# Patient Record
Sex: Female | Born: 1940 | Race: White | Hispanic: No | Marital: Married | Smoking: Never smoker
Health system: Southern US, Community
[De-identification: ages and names within clinical notes are randomized; demographics above are authoritative.]

## PROBLEM LIST (undated history)

## (undated) DIAGNOSIS — I340 Nonrheumatic mitral (valve) insufficiency: Secondary | ICD-10-CM

## (undated) DIAGNOSIS — N183 Chronic kidney disease, stage 3 unspecified: Secondary | ICD-10-CM

## (undated) DIAGNOSIS — I10 Essential (primary) hypertension: Secondary | ICD-10-CM

## (undated) DIAGNOSIS — I428 Other cardiomyopathies: Secondary | ICD-10-CM

## (undated) DIAGNOSIS — E119 Type 2 diabetes mellitus without complications: Secondary | ICD-10-CM

## (undated) DIAGNOSIS — R42 Dizziness and giddiness: Secondary | ICD-10-CM

## (undated) DIAGNOSIS — E7800 Pure hypercholesterolemia, unspecified: Secondary | ICD-10-CM

## (undated) HISTORY — DX: Chronic kidney disease, stage 3 unspecified: N18.30

## (undated) HISTORY — DX: Type 2 diabetes mellitus without complications: E11.9

## (undated) HISTORY — DX: Other cardiomyopathies: I42.8

## (undated) HISTORY — DX: Essential (primary) hypertension: I10

## (undated) HISTORY — DX: Dizziness and giddiness: R42

## (undated) HISTORY — DX: Nonrheumatic mitral (valve) insufficiency: I34.0

## (undated) HISTORY — DX: Pure hypercholesterolemia, unspecified: E78.00

## (undated) HISTORY — DX: Chronic kidney disease, stage 3 (moderate): N18.3

## (undated) HISTORY — PX: TONSILLECTOMY: SHX5217

## (undated) HISTORY — PX: TUBAL LIGATION: SHX77

---

## 2010-07-13 ENCOUNTER — Inpatient Hospital Stay (HOSPITAL_COMMUNITY)
Admission: EM | Admit: 2010-07-13 | Discharge: 2010-07-25 | Payer: Self-pay | Source: Home / Self Care | Admitting: Cardiology

## 2010-07-14 ENCOUNTER — Encounter (INDEPENDENT_AMBULATORY_CARE_PROVIDER_SITE_OTHER): Payer: Self-pay | Admitting: Cardiology

## 2010-07-18 HISTORY — PX: CARDIAC CATHETERIZATION: SHX172

## 2010-11-01 ENCOUNTER — Ambulatory Visit: Payer: Self-pay | Admitting: Oncology

## 2010-11-10 LAB — COMPREHENSIVE METABOLIC PANEL
ALT: 9 U/L (ref 0–35)
AST: 11 U/L (ref 0–37)
Albumin: 4.5 g/dL (ref 3.5–5.2)
Alkaline Phosphatase: 90 U/L (ref 39–117)
BUN: 29 mg/dL — ABNORMAL HIGH (ref 6–23)
CO2: 26 mEq/L (ref 19–32)
Calcium: 9.9 mg/dL (ref 8.4–10.5)
Chloride: 102 mEq/L (ref 96–112)
Creatinine, Ser: 1.26 mg/dL — ABNORMAL HIGH (ref 0.40–1.20)
Glucose, Bld: 134 mg/dL — ABNORMAL HIGH (ref 70–99)
Potassium: 4.5 mEq/L (ref 3.5–5.3)
Sodium: 140 mEq/L (ref 135–145)
Total Bilirubin: 0.7 mg/dL (ref 0.3–1.2)
Total Protein: 7.2 g/dL (ref 6.0–8.3)

## 2010-11-10 LAB — CBC WITH DIFFERENTIAL/PLATELET
BASO%: 0.2 % (ref 0.0–2.0)
Basophils Absolute: 0 10*3/uL (ref 0.0–0.1)
EOS%: 25.7 % — ABNORMAL HIGH (ref 0.0–7.0)
Eosinophils Absolute: 3 10*3/uL — ABNORMAL HIGH (ref 0.0–0.5)
HCT: 35.6 % (ref 34.8–46.6)
HGB: 11.7 g/dL (ref 11.6–15.9)
LYMPH%: 14.8 % (ref 14.0–49.7)
MCH: 29.3 pg (ref 25.1–34.0)
MCHC: 33 g/dL (ref 31.5–36.0)
MCV: 88.8 fL (ref 79.5–101.0)
MONO#: 0.5 10*3/uL (ref 0.1–0.9)
MONO%: 4.6 % (ref 0.0–14.0)
NEUT#: 6.4 10*3/uL (ref 1.5–6.5)
NEUT%: 54.7 % (ref 38.4–76.8)
Platelets: 355 10*3/uL (ref 145–400)
RBC: 4.01 10*6/uL (ref 3.70–5.45)
RDW: 15.4 % — ABNORMAL HIGH (ref 11.2–14.5)
WBC: 11.8 10*3/uL — ABNORMAL HIGH (ref 3.9–10.3)
lymph#: 1.7 10*3/uL (ref 0.9–3.3)

## 2010-11-10 LAB — CHCC SMEAR

## 2010-11-10 LAB — TECHNOLOGIST REVIEW: Technologist Review: NONE SEEN

## 2010-11-15 LAB — JAK-2 V617F

## 2010-11-15 LAB — BCR/ABL (LIO MMD)

## 2010-11-21 ENCOUNTER — Inpatient Hospital Stay (HOSPITAL_COMMUNITY)
Admission: EM | Admit: 2010-11-21 | Discharge: 2010-11-25 | Payer: Self-pay | Source: Home / Self Care | Attending: Internal Medicine | Admitting: Internal Medicine

## 2010-11-22 ENCOUNTER — Encounter (INDEPENDENT_AMBULATORY_CARE_PROVIDER_SITE_OTHER): Payer: Self-pay | Admitting: Internal Medicine

## 2010-11-23 LAB — URINE MICROSCOPIC-ADD ON

## 2010-11-23 LAB — COMPREHENSIVE METABOLIC PANEL
ALT: 15 U/L (ref 0–35)
AST: 18 U/L (ref 0–37)
Albumin: 2.8 g/dL — ABNORMAL LOW (ref 3.5–5.2)
Alkaline Phosphatase: 91 U/L (ref 39–117)
BUN: 62 mg/dL — ABNORMAL HIGH (ref 6–23)
CO2: 24 mEq/L (ref 19–32)
Calcium: 9.2 mg/dL (ref 8.4–10.5)
Chloride: 97 mEq/L (ref 96–112)
Creatinine, Ser: 2.02 mg/dL — ABNORMAL HIGH (ref 0.4–1.2)
GFR calc Af Amer: 30 mL/min — ABNORMAL LOW (ref >60)
GFR calc non Af Amer: 24 mL/min — ABNORMAL LOW (ref >60)
Glucose, Bld: 297 mg/dL — ABNORMAL HIGH (ref 70–99)
Potassium: 3.8 mEq/L (ref 3.5–5.1)
Sodium: 133 mEq/L — ABNORMAL LOW (ref 135–145)
Total Bilirubin: 0.8 mg/dL (ref 0.3–1.2)
Total Protein: 6.6 g/dL (ref 6.0–8.3)

## 2010-11-23 LAB — GLUCOSE, CAPILLARY
Glucose-Capillary: 158 mg/dL — ABNORMAL HIGH (ref 70–99)
Glucose-Capillary: 201 mg/dL — ABNORMAL HIGH (ref 70–99)
Glucose-Capillary: 175 mg/dL — ABNORMAL HIGH (ref 70–99)
Glucose-Capillary: 150 mg/dL — ABNORMAL HIGH (ref 70–99)

## 2010-11-23 LAB — URINALYSIS, ROUTINE W REFLEX MICROSCOPIC
Bilirubin Urine: NEGATIVE
Ketones, ur: NEGATIVE mg/dL
Nitrite: NEGATIVE
Protein, ur: 30 mg/dL — AB
Specific Gravity, Urine: 1.017 (ref 1.005–1.030)
Urine Glucose, Fasting: NEGATIVE mg/dL
Urobilinogen, UA: 1 mg/dL (ref 0.0–1.0)
pH: 5 (ref 5.0–8.0)

## 2010-11-23 LAB — BASIC METABOLIC PANEL
BUN: 62 mg/dL — ABNORMAL HIGH (ref 6–23)
CO2: 25 mEq/L (ref 19–32)
Calcium: 8.8 mg/dL (ref 8.4–10.5)
Chloride: 103 mEq/L (ref 96–112)
Creatinine, Ser: 1.77 mg/dL — ABNORMAL HIGH (ref 0.4–1.2)
GFR calc Af Amer: 35 mL/min — ABNORMAL LOW (ref >60)
GFR calc non Af Amer: 29 mL/min — ABNORMAL LOW (ref >60)
Glucose, Bld: 191 mg/dL — ABNORMAL HIGH (ref 70–99)
Potassium: 3.6 mEq/L (ref 3.5–5.1)
Sodium: 136 mEq/L (ref 135–145)

## 2010-11-23 LAB — PROTIME-INR
INR: 1.21 (ref 0.00–1.49)
Prothrombin Time: 15.5 seconds — ABNORMAL HIGH (ref 11.6–15.2)

## 2010-11-23 LAB — CBC
HCT: 30.7 % — ABNORMAL LOW (ref 36.0–46.0)
HCT: 28.8 % — ABNORMAL LOW (ref 36.0–46.0)
Hemoglobin: 10.3 g/dL — ABNORMAL LOW (ref 12.0–15.0)
Hemoglobin: 9.6 g/dL — ABNORMAL LOW (ref 12.0–15.0)
MCH: 29.6 pg (ref 26.0–34.0)
MCH: 29.3 pg (ref 26.0–34.0)
MCHC: 33.6 g/dL (ref 30.0–36.0)
MCHC: 33.3 g/dL (ref 30.0–36.0)
MCV: 88.2 fL (ref 78.0–100.0)
MCV: 87.8 fL (ref 78.0–100.0)
Platelets: 234 10*3/uL (ref 150–400)
Platelets: 236 10*3/uL (ref 150–400)
RBC: 3.48 MIL/uL — ABNORMAL LOW (ref 3.87–5.11)
RBC: 3.28 MIL/uL — ABNORMAL LOW (ref 3.87–5.11)
RDW: 14.4 % (ref 11.5–15.5)
RDW: 14.4 % (ref 11.5–15.5)
WBC: 16 10*3/uL — ABNORMAL HIGH (ref 4.0–10.5)
WBC: 15.9 10*3/uL — ABNORMAL HIGH (ref 4.0–10.5)

## 2010-11-23 LAB — DIFFERENTIAL
Basophils Absolute: 0 10*3/uL (ref 0.0–0.1)
Basophils Relative: 0 % (ref 0–1)
Eosinophils Absolute: 0 10*3/uL (ref 0.0–0.7)
Eosinophils Relative: 0 % (ref 0–5)
Lymphocytes Relative: 5 % — ABNORMAL LOW (ref 12–46)
Lymphs Abs: 0.9 10*3/uL (ref 0.7–4.0)
Monocytes Absolute: 1.4 10*3/uL — ABNORMAL HIGH (ref 0.1–1.0)
Monocytes Relative: 9 % (ref 3–12)
Neutro Abs: 13.8 10*3/uL — ABNORMAL HIGH (ref 1.7–7.7)
Neutrophils Relative %: 86 % — ABNORMAL HIGH (ref 43–77)

## 2010-11-23 LAB — HEMOGLOBIN A1C
Hgb A1c MFr Bld: 6.2 % — ABNORMAL HIGH (ref <5.7)
Mean Plasma Glucose: 131 mg/dL — ABNORMAL HIGH (ref <117)

## 2010-11-23 LAB — LIPID PANEL
Cholesterol: 127 mg/dL (ref 0–200)
HDL: 17 mg/dL — ABNORMAL LOW (ref >39)
LDL Cholesterol: 62 mg/dL (ref 0–99)
Total CHOL/HDL Ratio: 7.5 RATIO
Triglycerides: 240 mg/dL — ABNORMAL HIGH (ref <150)
VLDL: 48 mg/dL — ABNORMAL HIGH (ref 0–40)

## 2010-11-23 LAB — TSH: TSH: 1.747 u[IU]/mL (ref 0.350–4.500)

## 2010-11-23 LAB — CARDIAC PANEL(CRET KIN+CKTOT+MB+TROPI)
CK, MB: 1.5 ng/mL (ref 0.3–4.0)
CK, MB: 1.1 ng/mL (ref 0.3–4.0)
Relative Index: INVALID (ref 0.0–2.5)
Relative Index: INVALID (ref 0.0–2.5)
Total CK: 43 U/L (ref 7–177)
Total CK: 37 U/L (ref 7–177)
Troponin I: 0.05 ng/mL (ref 0.00–0.06)
Troponin I: 0.03 ng/mL (ref 0.00–0.06)

## 2010-11-23 LAB — LIPASE, BLOOD: Lipase: 23 U/L (ref 11–59)

## 2010-11-23 LAB — TROPONIN I: Troponin I: 0.01 ng/mL (ref 0.00–0.06)

## 2010-11-23 LAB — PHOSPHORUS: Phosphorus: 3.4 mg/dL (ref 2.3–4.6)

## 2010-11-23 LAB — PROCALCITONIN: Procalcitonin: 8.46 ng/mL

## 2010-11-23 LAB — CK TOTAL AND CKMB (NOT AT ARMC)
CK, MB: 1.2 ng/mL (ref 0.3–4.0)
Relative Index: INVALID (ref 0.0–2.5)
Total CK: 44 U/L (ref 7–177)

## 2010-11-23 LAB — MAGNESIUM: Magnesium: 2.5 mg/dL (ref 1.5–2.5)

## 2010-11-23 LAB — LACTIC ACID, PLASMA: Lactic Acid, Venous: 1.1 mmol/L (ref 0.5–2.2)

## 2010-11-23 LAB — OCCULT BLOOD, POC DEVICE: Fecal Occult Bld: NEGATIVE

## 2010-11-28 LAB — GLUCOSE, CAPILLARY
Glucose-Capillary: 135 mg/dL — ABNORMAL HIGH (ref 70–99)
Glucose-Capillary: 186 mg/dL — ABNORMAL HIGH (ref 70–99)
Glucose-Capillary: 119 mg/dL — ABNORMAL HIGH (ref 70–99)

## 2010-11-28 LAB — BASIC METABOLIC PANEL
CO2: 23 mEq/L (ref 19–32)
Calcium: 8.6 mg/dL (ref 8.4–10.5)
Chloride: 107 mEq/L (ref 96–112)
Chloride: 107 mEq/L (ref 96–112)
Creatinine, Ser: 1.12 mg/dL (ref 0.4–1.2)
Creatinine, Ser: 1.1 mg/dL (ref 0.4–1.2)
GFR calc Af Amer: 48 mL/min — ABNORMAL LOW (ref >60)
GFR calc Af Amer: 59 mL/min — ABNORMAL LOW (ref >60)
GFR calc Af Amer: 60 mL/min — ABNORMAL LOW (ref >60)
GFR calc non Af Amer: 40 mL/min — ABNORMAL LOW (ref >60)
GFR calc non Af Amer: 48 mL/min — ABNORMAL LOW (ref >60)
Potassium: 3.4 mEq/L — ABNORMAL LOW (ref 3.5–5.1)
Potassium: 3.5 mEq/L (ref 3.5–5.1)
Sodium: 140 mEq/L (ref 135–145)
Sodium: 142 mEq/L (ref 135–145)

## 2010-11-28 LAB — CBC
HCT: 26.7 % — ABNORMAL LOW (ref 36.0–46.0)
HCT: 25.4 % — ABNORMAL LOW (ref 36.0–46.0)
Hemoglobin: 8.3 g/dL — ABNORMAL LOW (ref 12.0–15.0)
MCHC: 33.3 g/dL (ref 30.0–36.0)
MCV: 88.2 fL (ref 78.0–100.0)
MCV: 89.4 fL (ref 78.0–100.0)
Platelets: 235 10*3/uL (ref 150–400)
Platelets: 259 10*3/uL (ref 150–400)
RBC: 3.13 MIL/uL — ABNORMAL LOW (ref 3.87–5.11)
RBC: 2.84 MIL/uL — ABNORMAL LOW (ref 3.87–5.11)
RDW: 14.6 % (ref 11.5–15.5)
RDW: 14.5 % (ref 11.5–15.5)
WBC: 12.9 10*3/uL — ABNORMAL HIGH (ref 4.0–10.5)
WBC: 15.2 10*3/uL — ABNORMAL HIGH (ref 4.0–10.5)
WBC: 12.3 10*3/uL — ABNORMAL HIGH (ref 4.0–10.5)

## 2010-11-28 LAB — IRON AND TIBC: TIBC: 170 ug/dL — ABNORMAL LOW (ref 250–470)

## 2010-11-28 LAB — DIFFERENTIAL
Basophils Relative: 1 % (ref 0–1)
Eosinophils Relative: 5 % (ref 0–5)
Lymphocytes Relative: 19 % (ref 12–46)
Neutrophils Relative %: 65 % (ref 43–77)

## 2010-11-28 LAB — FERRITIN: Ferritin: 605 ng/mL — ABNORMAL HIGH (ref 10–291)

## 2010-11-28 LAB — BRAIN NATRIURETIC PEPTIDE: Pro B Natriuretic peptide (BNP): 414 pg/mL — ABNORMAL HIGH (ref 0.0–100.0)

## 2010-11-28 LAB — FOLATE: Folate: 16.1 ng/mL

## 2010-11-29 LAB — CULTURE, BLOOD (ROUTINE X 2): Culture  Setup Time: 201201180552

## 2010-11-30 LAB — CULTURE, BLOOD (ROUTINE X 2)

## 2010-12-02 NOTE — Discharge Summary (Addendum)
NAME:  Kimberly Castaneda, Kimberly Castaneda NO.:  [id number]  MEDICAL RECORD NO.:  [id number]          PATIENT TYPE:  INP  LOCATION:  5010                         FACILITY:  [facility]  PHYSICIAN:  Ramiro Harvest, MD    DATE OF BIRTH:  February 08, 1941  DATE OF ADMISSION:  11/21/2010 DATE OF DISCHARGE:  11/25/2010                        DISCHARGE SUMMARY - REFERRING   THE PATIENT'S PRIMARY CARE PHYSICIAN:  Dr. Laurann Montana of [facility].  DISCHARGE DIAGNOSES: 1. Weakness, multifactorial secondary to dehydration in the setting of     urinary tract infection, improved. 2. Urinary tract infection. 3. Orthostatic hypotension, resolved. 4. Acute renal failure, resolved. 5. Leukocytosis, being followed up as an outpatient with Dr. Clelia Croft. 6. Type 2 diabetes. 7. Nonischemic cardiomyopathy with a recent ejection fraction per 2-     dimensional echocardiogram of November 22, 2010, with an ejection     fraction of 45% to 50%. 8. History of systolic heart failure, compensated. 9. Hypertension. 10.History of allergies. 11.Anemia of chronic disease.  DISCHARGE MEDICATIONS: 1. Tessalon Perles 100 mg p.o. t.i.d. x3 days. 2. Ceftin 500 mg p.o. b.i.d. x4 days. 3. Zofran 4 mg p.o. every 6 hours p.r.n. 4. Aspirin 81 mg p.o. daily. 5. Coreg 12.5 mg p.o. b.i.d. 6. Mucinex 600 mg p.o. every 12 hours p.r.n. 7. Juice Plus garden blend supplement p.o. b.i.d. 8. Juice Plus orchid blend supplement p.o. b.i.d. 9. Lantus 12 units subcu q.h.s. 10.Metformin XR 500 mg 4 tablets p.o. q.h.s. 11.Omega-3 acid ethyl esters 1 tablet p.o. daily. 12.Ramipril 10 mg p.o. daily to start in 3 days. 13.Vitamin D 1000 units p.o. daily. 14.Lasix 40 mg half a tablet p.o. daily to start in 3 days.  DISPOSITION AND FOLLOW-UP:  The patient will be discharged home.  The patient is to follow up with a PCP early within 1 week.  On follow-up, the patient will need to be reassessed.  The patient's diuretics and spironolactone  were discontinued on discharge.  Her Lasix and her ramipril, the patient has been instructed to resume these in 3 days as the patient had presented with orthostatic hypotension and generalized weakness that improved off her diuretics.  The patient's renal function has improved as well.  The patient will need a BMET done on follow-up to follow up on electrolytes and renal function.  She will need a repeat urinalysis done for resolution of urinary tract infection.  The patient will need to follow up with her oncologist, Dr. Clelia Croft, as scheduled for further workup of her leukocytosis.  The patient will need a CBC done to follow up on her leukocytosis and hemoglobin and hematocrit.  CONSULTATIONS DONE:  None.  PROCEDURES PERFORMED: 1. A chest x-ray was done November 21, 2010, that showed minimal     peribronchial thickening, interval clearing of previously noted     left-sided pleural effusion.  No infiltrate, congestive heart     failure, or pneumothorax.  Mildly tortuous aorta. 2. A 2-D echo was obtained on November 22, 2010, that showed mild     concentric hypertrophy.  Systolic function was mildly reduced.  EF     of 45% to 50%.  Wall  motion was normal.  There were no regional     wall motion abnormalities.  Doppler parameters consistent with     abnormal left ventricular relaxation, grade 1 diastolic     dysfunction, normal pulmonary artery pressure.  BRIEF ADMISSION HISTORY AND PHYSICAL:  [name] is a 70 year old female known history of type 2 diabetes, hypertension, recently diagnosed ischemic cardiomyopathy with an EF of 15% who was discharged home in September of 2011.  The patient apparently in December had a leukocytosis with a white count of 20,000.  Repeat WBC showed that she had a white count up to 30,000.  She is being followed by oncology at that point.  White count was down to 11,000.  Since the last Friday prior to admission, the patient had been feeling weak  and sick.  She had had some bouts of diarrhea and also had nausea and vomiting.  She had not been eating or drinking adequately.  Her husband also noted weakness which had been persistent and progressive.  She decided, therefore, to present to the ED on the day of admission where she was found to be profoundly dehydrated as well as acute renal failure.  The patient had been only on Aldactone as well as Lasix for her diuretics.  Her last creatinine was 0.8 in September.  BNP then was only 218.  The patient denied any chest pain.  No shortness of breath.  No dizziness.  Had not been passing urine adequately.  She had an episode of chills the day prior to admission but no fever and was just globally week.  For the rest of the admission history and physical, please see H and P dictated per Dr. Mikeal Hawthorne, job number [id number].  HOSPITAL COURSE: 1. Generalized weakness.  The patient was admitted with generalized     weakness.  It was felt the patient's generalized weakness was     multifactorial in nature secondary to volume depletion secondary to     poor oral intake and some GI losses as well as underlying urinary     tract infection.  The patient was placed on IV antibiotics.  PT, OT     were consulted.  She was hydrated gently with IV fluids secondary     to a history of a depressed ejection fraction.  Cardiac enzymes     were cycled which were negative x3.  A TSH which was obtained was     within normal limits at 1.747.  A fecal occult blood test which was     obtained was also negative.  A pro calcitonin level was obtained     which was within normal limits.  Lipase levels were also within     normal limits.  The patient was followed, improved clinically     during the hospitalization, hydrated with IV fluids adequately.     She was seen by PT, OT.  The patient started working with physical     therapy, started ambulating with PT, OT, improved clinically.  Her     Lasix and her spironolactone  were held secondary to severe volume     depletion/dehydration secondary to acute renal failure.  The     patient was monitored and improved clinically on a daily basis such     that by the day of discharge she was ambulating with physical     therapy.  The patient will be discharged home in stable and     improved condition to follow  up with PCP as an outpatient. 2. Urinary tract infection.  On admission, urinalysis which was     obtained was consistent with a urinary tract infection.  However,     urine cultures, unfortunately, were not sent.  The patient was     placed on IV antibiotics during this time.  She had been on IV     Rocephin, and she will be subsequently converted to oral Ceftin for     4 more days to complete a 1-week course of antibiotic therapy.  The     patient will follow up with PCP as an outpatient where she will     likely need a repeat urinalysis done for resolution of urinary     tract infection. 3. Orthostatic hypotension.  On admission, the patient was noted to be     orthostatic.  Her diuretics and her blood pressure medications were     held.  Lasix and spironolactone as well as Coreg and ramipril were     held.  The patient was hydrated with IV fluids.  The patient was     euvolemic by the day of discharge.  She improved clinically     significantly, was asymptomatic, and will be discharged home in     stable and improved condition.  The patient has been instructed not     to resume Aldactone until she is seen by her PCP.  Her Lasix will     be resumed in 3 days as well as her ramipril.  The patient will be     discharged in stable and improved condition. 4. Acute renal failure.  On admission, the patient was noted to be in     acute renal failure with a creatinine of 2.02.  It was felt this     was prerenal azotemia secondary to volume depletion in the setting     of diuretic therapy and ACE inhibitor.  Her diuretics, Aldactone,     Lasix as well as  ramipril, were held.  The patient was hydrated     gently with IV fluids.  Her renal function improved on a daily     basis such that by the day of discharge her creatinine was down to     1.10.  The patient was having good urinary output during this time,     and she will be discharged home in stable and improved condition.     The patient has been instructed to resume her ramipril in 3 days as     well as her home-dose Lasix in 3 days.  Her Aldactone has been     discontinued at this time, and she will need to follow up with her     PCP to decide as to whether the patient is going to resume her     Aldactone in light of the fact that a repeat 2-D echo which was     obtained did show an EF of 455 to 50%.  The patient will be     discharged in stable and improved condition. 5. A history of nonischemic cardiomyopathy/history of CHF.  On     admission, the patient was admitted with profound dehydration.  It     was felt likely secondary to decreased oral intake, GI losses in     the setting of UTI as well as in the setting of diuretics.  The     patient's diuretics were held during the hospitalization as well  as     her ramipril.  Cardiac enzymes which were cycled were negative x3.     Two-D echo which was done with results as stated above did show an     EF of 455 to 50% with no wall motion abnormalities which was an     improvement from her last 2-D echo which was obtained in September     of 2011.  The patient remained compensated during this period.  Her     Coreg was resumed during the hospitalization as the patient     improved clinically.  She will be discharged home to resume her     Lasix in 3 days as well as her ramipril in 3 days post discharge.     Aldactone has been discontinued.  She will follow up with her PCP,     and on follow-up a BMET will need to be obtained to follow up on     electrolytes and renal function, and it will be deferred as to her     PCP as to whether to  resume the patient back her Aldactone.  The     patient will be discharged in stable and improved condition.  The     rest of the patient's chronic medical issues remained stable     throughout the hospitalization.  On day of discharge:  Vital signs:  Temperature 99.6, pulse of 67, respirations 16, blood pressure 138/62, saturating 96% on room air.  DISCHARGE LABS:  CBC:  White count 12.3, hemoglobin 8.3, hematocrit 25.4, platelet count of 298, with an ANC of 8.1.  BMET with a sodium of 142, potassium of 3.5, chloride of 107, bicarb 27, glucose 132, BUN 15, creatinine 1.10, and a calcium of 8.4.  It has been a pleasure taking care of Ms. [employer].     Ramiro Harvest, MD     DT/MEDQ  D:  11/25/2010  T:  11/25/2010  Job:  [id number]  cc:   Stacie Acres. Cliffton Asters, M.D. Blenda Nicely. [employer]  Electronically Signed by Ramiro Harvest MD on 12/02/2010 12:25:41 PM

## 2011-01-17 ENCOUNTER — Other Ambulatory Visit: Payer: Self-pay | Admitting: Oncology

## 2011-01-17 ENCOUNTER — Encounter (HOSPITAL_BASED_OUTPATIENT_CLINIC_OR_DEPARTMENT_OTHER): Payer: Medicare Other | Admitting: Oncology

## 2011-01-17 DIAGNOSIS — D72829 Elevated white blood cell count, unspecified: Secondary | ICD-10-CM

## 2011-01-17 LAB — CBC WITH DIFFERENTIAL/PLATELET
Basophils Absolute: 0.1 10*3/uL (ref 0.0–0.1)
EOS%: 4.7 % (ref 0.0–7.0)
Eosinophils Absolute: 0.4 10*3/uL (ref 0.0–0.5)
HCT: 32.4 % — ABNORMAL LOW (ref 34.8–46.6)
HGB: 10.8 g/dL — ABNORMAL LOW (ref 11.6–15.9)
LYMPH%: 21.5 % (ref 14.0–49.7)
MCH: 29.7 pg (ref 25.1–34.0)
MCHC: 33.2 g/dL (ref 31.5–36.0)
MONO#: 0.5 10*3/uL (ref 0.1–0.9)
NEUT#: 5.7 10*3/uL (ref 1.5–6.5)
NEUT%: 67.2 % (ref 38.4–76.8)
Platelets: 279 10*3/uL (ref 145–400)
lymph#: 1.8 10*3/uL (ref 0.9–3.3)

## 2011-01-19 LAB — CBC
HCT: 40.8 % (ref 36.0–46.0)
Hemoglobin: 13.4 g/dL (ref 12.0–15.0)
Hemoglobin: 13.4 g/dL (ref 12.0–15.0)
Hemoglobin: 14.4 g/dL (ref 12.0–15.0)
MCH: 28.8 pg (ref 26.0–34.0)
MCHC: 34.3 g/dL (ref 30.0–36.0)
RBC: 4.65 MIL/uL (ref 3.87–5.11)
RBC: 5.01 MIL/uL (ref 3.87–5.11)
RDW: 13.1 % (ref 11.5–15.5)
WBC: 10.5 10*3/uL (ref 4.0–10.5)
WBC: 11.2 10*3/uL — ABNORMAL HIGH (ref 4.0–10.5)

## 2011-01-19 LAB — POCT I-STAT 3, VENOUS BLOOD GAS (G3P V)
Acid-Base Excess: 3 mmol/L — ABNORMAL HIGH (ref 0.0–2.0)
Bicarbonate: 26.7 mEq/L — ABNORMAL HIGH (ref 20.0–24.0)
Bicarbonate: 30.3 mEq/L — ABNORMAL HIGH (ref 20.0–24.0)
O2 Saturation: 59 %
TCO2: 28 mmol/L (ref 0–100)
TCO2: 29 mmol/L (ref 0–100)
TCO2: 32 mmol/L (ref 0–100)
pCO2, Ven: 45 mmHg (ref 45.0–50.0)
pCO2, Ven: 51.2 mmHg — ABNORMAL HIGH (ref 45.0–50.0)
pH, Ven: 7.325 — ABNORMAL HIGH (ref 7.250–7.300)
pH, Ven: 7.4 — ABNORMAL HIGH (ref 7.250–7.300)
pO2, Ven: 29 mmHg — CL (ref 30.0–45.0)

## 2011-01-19 LAB — COMPREHENSIVE METABOLIC PANEL
AST: 45 U/L — ABNORMAL HIGH (ref 0–37)
Albumin: 4.2 g/dL (ref 3.5–5.2)
Alkaline Phosphatase: 134 U/L — ABNORMAL HIGH (ref 39–117)
BUN: 16 mg/dL (ref 6–23)
Creatinine, Ser: 0.77 mg/dL (ref 0.4–1.2)
GFR calc Af Amer: >60 mL/min (ref >60)
Potassium: 5.5 mEq/L — ABNORMAL HIGH (ref 3.5–5.1)
Total Protein: 7.3 g/dL (ref 6.0–8.3)

## 2011-01-19 LAB — POCT I-STAT 3, ART BLOOD GAS (G3+)
Acid-Base Excess: 3 mmol/L — ABNORMAL HIGH (ref 0.0–2.0)
O2 Saturation: 87 %
pCO2 arterial: 44.4 mmHg (ref 35.0–45.0)

## 2011-01-19 LAB — BRAIN NATRIURETIC PEPTIDE
Pro B Natriuretic peptide (BNP): 296 pg/mL — ABNORMAL HIGH (ref 0.0–100.0)
Pro B Natriuretic peptide (BNP): 332 pg/mL — ABNORMAL HIGH (ref 0.0–100.0)

## 2011-01-19 LAB — BASIC METABOLIC PANEL
BUN: 24 mg/dL — ABNORMAL HIGH (ref 6–23)
BUN: 13 mg/dL (ref 6–23)
BUN: 19 mg/dL (ref 6–23)
BUN: 19 mg/dL (ref 6–23)
BUN: 19 mg/dL (ref 6–23)
CO2: 27 mEq/L (ref 19–32)
CO2: 27 mEq/L (ref 19–32)
CO2: 27 mEq/L (ref 19–32)
Calcium: 10.2 mg/dL (ref 8.4–10.5)
Calcium: 10 mg/dL (ref 8.4–10.5)
Calcium: 9.2 mg/dL (ref 8.4–10.5)
Calcium: 9.5 mg/dL (ref 8.4–10.5)
Calcium: 9.8 mg/dL (ref 8.4–10.5)
Chloride: 104 mEq/L (ref 96–112)
Chloride: 97 mEq/L (ref 96–112)
Chloride: 98 mEq/L (ref 96–112)
Creatinine, Ser: 0.88 mg/dL (ref 0.4–1.2)
Creatinine, Ser: 0.75 mg/dL (ref 0.4–1.2)
Creatinine, Ser: 0.78 mg/dL (ref 0.4–1.2)
GFR calc Af Amer: >60 mL/min (ref >60)
GFR calc Af Amer: >60 mL/min (ref >60)
GFR calc non Af Amer: >60 mL/min (ref >60)
GFR calc non Af Amer: >60 mL/min (ref >60)
GFR calc non Af Amer: >60 mL/min (ref >60)
GFR calc non Af Amer: >60 mL/min (ref >60)
Glucose, Bld: 131 mg/dL — ABNORMAL HIGH (ref 70–99)
Glucose, Bld: 245 mg/dL — ABNORMAL HIGH (ref 70–99)
Glucose, Bld: 322 mg/dL — ABNORMAL HIGH (ref 70–99)
Potassium: 3.5 mEq/L (ref 3.5–5.1)
Potassium: 4.4 mEq/L (ref 3.5–5.1)
Potassium: 4.5 mEq/L (ref 3.5–5.1)
Sodium: 135 mEq/L (ref 135–145)
Sodium: 135 mEq/L (ref 135–145)
Sodium: 139 mEq/L (ref 135–145)

## 2011-01-19 LAB — DIFFERENTIAL
Eosinophils Relative: 1 % (ref 0–5)
Lymphocytes Relative: 19 % (ref 12–46)
Monocytes Absolute: 0.7 10*3/uL (ref 0.1–1.0)
Monocytes Relative: 6 % (ref 3–12)
Neutro Abs: 8.3 10*3/uL — ABNORMAL HIGH (ref 1.7–7.7)

## 2011-01-19 LAB — HEMOGLOBIN A1C
Hgb A1c MFr Bld: 12.3 % — ABNORMAL HIGH (ref <5.7)
Mean Plasma Glucose: 306 mg/dL — ABNORMAL HIGH (ref <117)

## 2011-01-19 LAB — PROTIME-INR
INR: 0.95 (ref 0.00–1.49)
INR: 0.99 (ref 0.00–1.49)
Prothrombin Time: 12.9 seconds (ref 11.6–15.2)

## 2011-01-19 LAB — FERRITIN: Ferritin: 228 ng/mL (ref 10–291)

## 2011-01-19 LAB — GLUCOSE, CAPILLARY
Glucose-Capillary: 181 mg/dL — ABNORMAL HIGH (ref 70–99)
Glucose-Capillary: 184 mg/dL — ABNORMAL HIGH (ref 70–99)
Glucose-Capillary: 217 mg/dL — ABNORMAL HIGH (ref 70–99)
Glucose-Capillary: 220 mg/dL — ABNORMAL HIGH (ref 70–99)
Glucose-Capillary: 221 mg/dL — ABNORMAL HIGH (ref 70–99)
Glucose-Capillary: 239 mg/dL — ABNORMAL HIGH (ref 70–99)
Glucose-Capillary: 257 mg/dL — ABNORMAL HIGH (ref 70–99)
Glucose-Capillary: 284 mg/dL — ABNORMAL HIGH (ref 70–99)
Glucose-Capillary: 175 mg/dL — ABNORMAL HIGH (ref 70–99)
Glucose-Capillary: 178 mg/dL — ABNORMAL HIGH (ref 70–99)
Glucose-Capillary: 188 mg/dL — ABNORMAL HIGH (ref 70–99)
Glucose-Capillary: 192 mg/dL — ABNORMAL HIGH (ref 70–99)
Glucose-Capillary: 215 mg/dL — ABNORMAL HIGH (ref 70–99)
Glucose-Capillary: 223 mg/dL — ABNORMAL HIGH (ref 70–99)
Glucose-Capillary: 224 mg/dL — ABNORMAL HIGH (ref 70–99)
Glucose-Capillary: 277 mg/dL — ABNORMAL HIGH (ref 70–99)
Glucose-Capillary: 307 mg/dL — ABNORMAL HIGH (ref 70–99)
Glucose-Capillary: 320 mg/dL — ABNORMAL HIGH (ref 70–99)

## 2011-01-19 LAB — APTT: aPTT: 34 seconds (ref 24–37)

## 2011-01-19 LAB — CARDIAC PANEL(CRET KIN+CKTOT+MB+TROPI)
CK, MB: 2.8 ng/mL (ref 0.3–4.0)
Relative Index: 2.5 (ref 0.0–2.5)
Relative Index: INVALID (ref 0.0–2.5)
Total CK: 70 U/L (ref 7–177)
Troponin I: 0.06 ng/mL (ref 0.00–0.06)

## 2011-01-19 LAB — IRON: Iron: 55 ug/dL (ref 42–135)

## 2011-01-19 LAB — LIPID PANEL
Cholesterol: 169 mg/dL (ref 0–200)
HDL: 55 mg/dL (ref >39)
LDL Cholesterol: 95 mg/dL (ref 0–99)
Total CHOL/HDL Ratio: 3.1 RATIO

## 2011-01-19 LAB — MAGNESIUM: Magnesium: 2.2 mg/dL (ref 1.5–2.5)

## 2012-04-03 HISTORY — PX: US ECHOCARDIOGRAPHY: HXRAD669

## 2013-04-04 ENCOUNTER — Other Ambulatory Visit: Payer: Self-pay | Admitting: Nephrology

## 2013-04-07 ENCOUNTER — Ambulatory Visit
Admission: RE | Admit: 2013-04-07 | Discharge: 2013-04-07 | Disposition: A | Discharge status: Home / Self Care | Payer: Medicare Other | Source: Ambulatory Visit | Attending: Nephrology | Admitting: Nephrology

## 2013-04-15 ENCOUNTER — Encounter: Payer: Self-pay | Admitting: *Deleted

## 2013-04-16 ENCOUNTER — Ambulatory Visit (INDEPENDENT_AMBULATORY_CARE_PROVIDER_SITE_OTHER): Payer: Medicare Other | Admitting: Cardiovascular Disease

## 2013-04-16 ENCOUNTER — Encounter: Payer: Self-pay | Admitting: Cardiovascular Disease

## 2013-04-16 VITALS — BP 134/82 | HR 77 | Resp 16 | Ht 62.0 in | Wt 147.4 lb

## 2013-04-16 DIAGNOSIS — Z79899 Other long term (current) drug therapy: Secondary | ICD-10-CM

## 2013-04-16 DIAGNOSIS — E119 Type 2 diabetes mellitus without complications: Secondary | ICD-10-CM

## 2013-04-16 DIAGNOSIS — E7800 Pure hypercholesterolemia, unspecified: Secondary | ICD-10-CM

## 2013-04-16 DIAGNOSIS — E785 Hyperlipidemia, unspecified: Secondary | ICD-10-CM | POA: Insufficient documentation

## 2013-04-16 DIAGNOSIS — I10 Essential (primary) hypertension: Secondary | ICD-10-CM

## 2013-04-16 DIAGNOSIS — I255 Ischemic cardiomyopathy: Secondary | ICD-10-CM

## 2013-04-16 DIAGNOSIS — I5032 Chronic diastolic (congestive) heart failure: Secondary | ICD-10-CM | POA: Insufficient documentation

## 2013-04-16 DIAGNOSIS — E782 Mixed hyperlipidemia: Secondary | ICD-10-CM

## 2013-04-16 DIAGNOSIS — I428 Other cardiomyopathies: Secondary | ICD-10-CM | POA: Insufficient documentation

## 2013-04-16 DIAGNOSIS — I2589 Other forms of chronic ischemic heart disease: Secondary | ICD-10-CM

## 2013-04-16 NOTE — Assessment & Plan Note (Signed)
Will retrieve the lab reports from her nephrologist. Judging by the fact that no adjustments were made to her metformin I suspect that she still has reasonably stable renal function. Tight blood pressure control is also important for the preservation of renal function.

## 2013-04-16 NOTE — Patient Instructions (Addendum)
Your physician recommends that you return for lab work done fasting.  Take the lab request forms to [facility] one morning fasting (no food or drink)-water with meds only.  Return to see Dr. Royann Shivers in one year.

## 2013-04-16 NOTE — Assessment & Plan Note (Signed)
Well controlled 

## 2013-04-16 NOTE — Assessment & Plan Note (Signed)
Most recent ejection fraction 40-45%.

## 2013-04-16 NOTE — Assessment & Plan Note (Addendum)
Upon presentation with congestive heart failure her ejection fraction was as low as 25%, but this improved dramatically with heart failure/antihypertensive medication. This suggests that the bulk of her cardiomyopathy may simply have been related to untreated systemic hypertension. Her blood pressure is now well controlled and she is on excellent doses of beta blockers and inhibitors.  She has never cessation functional class I and appears to be euvolemic. She is doing good job with sodium restriction. I reminded her about the importance of daily weight monitoring and the signs and symptoms of congestive heart failure.

## 2013-04-16 NOTE — Assessment & Plan Note (Signed)
She reports good glycemic control 

## 2013-04-16 NOTE — Progress Notes (Signed)
Patient ID: Kimberly Castaneda, female   DOB: 1941-10-24, 72 y.o.   MRN: [id number]      Reason for office visit Followup congestive heart failure  Mrs. Dolce has done quite well since her previous appointment. She has not had any episodes of acute heart failure exacerbation. She is very conscientious with sodium restriction. She takes her medications faithfully. She denies problems with lower stranding edema or shortness of breath, but lives a fairly sedentary lifestyle. Denies syncope or palpitations.  Allergies  Allergen Reactions  . Ciprofloxacin Hcl Nausea Only  . Erythromycin   . Quinapril     Current Outpatient Prescriptions  Medication Sig Dispense Refill  . alendronate (FOSAMAX) 70 MG tablet       . amLODipine (NORVASC) 2.5 MG tablet Take 2.5 mg by mouth daily.      . Ascorbic Acid (VITA-C PO) Take 1 tablet by mouth daily.      Marland Kitchen aspirin 81 MG tablet Take 81 mg by mouth daily.      Marland Kitchen atorvastatin (LIPITOR) 20 MG tablet Take 20 mg by mouth daily.      . carvedilol (COREG) 25 MG tablet Take 25 mg by mouth 2 (two) times daily.      . cholecalciferol (VITAMIN D) 1000 UNITS tablet Take 1,000 Units by mouth daily.      . fluticasone (FLONASE) 50 MCG/ACT nasal spray       . folic acid (FOLVITE) 1 MG tablet Take 1 mg by mouth daily.      . hydrochlorothiazide (MICROZIDE) 12.5 MG capsule Take 12.5 mg by mouth daily.      . metFORMIN (GLUCOPHAGE) 1000 MG tablet Take 2,000 mg by mouth at bedtime. At bedtime      . OMEGA 3 1200 MG CAPS Take 1,200 mg by mouth daily.      . ramipril (ALTACE) 10 MG tablet Take 10 mg by mouth 2 (two) times daily.       No current facility-administered medications for this visit.    Past Medical History  Diagnosis Date  . Nonischemic cardiomyopathy   . Systemic hypertension   . Diabetes mellitus     type 2  . Vertigo   . CKD (chronic kidney disease) stage 3, GFR 30-59 ml/min   . Hypercholesterolemia   . Mitral regurgitation     Past Surgical  History  Procedure Laterality Date  . Cardiac catheterization  07/18/2010    r/t nonsichemic cardiomyopathy; normal coronary anatomy; EF 20%;   Marland Kitchen US echocardiography  04/03/2012    EF 55%; mild LVH  . Tonsillectomy    . Tubal ligation      No family history on file.  History   Social History  . Marital Status: Married    Spouse Name: N/A    Number of Children: N/A  . Years of Education: N/A   Occupational History  . Not on file.   Social History Main Topics  . Smoking status: Never Smoker   . Smokeless tobacco: Not on file  . Alcohol Use: No  . Drug Use: No  . Sexually Active: Not on file   Other Topics Concern  . Not on file   Social History Narrative  . No narrative on file    Review of systems: The patient specifically denies any chest pain at rest or with exertion, dyspnea at rest or with exertion, orthopnea, paroxysmal nocturnal dyspnea, syncope, palpitations, focal neurological deficits, intermittent claudication, lower extremity edema, unexplained weight gain, cough, hemoptysis or wheezing.  The patient also denies abdominal pain, nausea, vomiting, dysphagia, diarrhea, constipation, polyuria, polydipsia, dysuria, hematuria, frequency, urgency, abnormal bleeding or bruising, fever, chills, unexpected weight changes, mood swings, change in skin or hair texture, change in voice quality, auditory or visual problems, allergic reactions or rashes, new musculoskeletal complaints other than usual "aches and pains".   PHYSICAL EXAM BP 134/82  Pulse 77  Resp 16  Ht 5\' 2"  (1.575 m)  Wt 66.86 kg (147 lb 6.4 oz)  BMI 26.95 kg/m2  General: Alert, oriented x3, no distress Head: no evidence of trauma, PERRL, EOMI, no exophtalmos or lid lag, no myxedema, no xanthelasma; normal ears, nose and oropharynx Neck: normal jugular venous pulsations and no hepatojugular reflux; brisk carotid pulses without delay and no carotid bruits Chest: clear to auscultation, no signs of  consolidation by percussion or palpation, normal fremitus, symmetrical and full respiratory excursions Cardiovascular: normal position and quality of the apical impulse, regular rhythm, normal first and second heart sounds, no murmurs, rubs or gallops Abdomen: no tenderness or distention, no masses by palpation, no abnormal pulsatility or arterial bruits, normal bowel sounds, no hepatosplenomegaly Extremities: no clubbing, cyanosis or edema; 2+ radial, ulnar and brachial pulses bilaterally; 2+ right femoral, posterior tibial and dorsalis pedis pulses; 2+ left femoral, posterior tibial and dorsalis pedis pulses; no subclavian or femoral bruits Neurological: grossly nonfocal   EKG: NSR, specific ST segment abnormalities; no changes from last trace  Lipid Panel     Component Value Date/Time   CHOL  Value: 127        ATP III CLASSIFICATION:  <200     mg/dL   Desirable  409-811  mg/dL   Borderline High  >=914    mg/dL   High        7/82/9562 0155   TRIG 240* 11/22/2010 0155   HDL 17* 11/22/2010 0155   CHOLHDL 7.5 11/22/2010 0155   VLDL 48* 11/22/2010 0155   LDLCALC  Value: 62        Total Cholesterol/HDL:CHD Risk Coronary Heart Disease Risk Table                     Men   Women  1/2 Average Risk   3.4   3.3  Average Risk       5.0   4.4  2 X Average Risk   9.6   7.1  3 X Average Risk  23.4   11.0        Use the calculated Patient Ratio above and the CHD Risk Table to determine the patient's CHD Risk.        ATP III CLASSIFICATION (LDL):  <100     mg/dL   Optimal  130-865  mg/dL   Near or Above                    Optimal  130-159  mg/dL   Borderline  784-696  mg/dL   High  >295     mg/dL   Very High 2/84/1324 4010    BMET    Component Value Date/Time   NA 142 11/25/2010 0457   K 3.5 11/25/2010 0457   CL 107 11/25/2010 0457   CO2 27 11/25/2010 0457   GLUCOSE 132* 11/25/2010 0457   BUN 15 11/25/2010 0457   CREATININE 1.10 11/25/2010 0457   CALCIUM 8.4 11/25/2010 0457   GFRNONAA 49* 11/25/2010 0457   GFRAA   Value: 60        The  eGFR has been calculated using the MDRD equation. This calculation has not been validated in all clinical situations. eGFR's persistently <60 mL/min signify possible Chronic Kidney Disease.* 11/25/2010 0457     ASSESSMENT AND PLAN Nonischemic cardiomyopathy Upon presentation with congestive heart failure her ejection fraction was as low as 25%, but this improved dramatically with heart failure/antihypertensive medication. This suggests that the bulk of her cardiomyopathy may simply have been related to untreated systemic hypertension. Her blood pressure is now well controlled and she is on excellent doses of beta blockers and inhibitors.  She has never cessation functional class I and appears to be euvolemic. She is doing good job with sodium restriction. I reminded her about the importance of daily weight monitoring and the signs and symptoms of congestive heart failure.  Systemic hypertension Well-controlled.  CKD (chronic kidney disease) stage 3, GFR 30-59 ml/min Will retrieve the lab reports from her nephrologist. Judging by the fact that no adjustments were made to her metformin I suspect that she still has reasonably stable renal function. Tight blood pressure control is also important for the preservation of renal function.  Diabetes mellitus, type 2 She reports good glycemic control.  Hypercholesterolemia It is time for repeat lipid profile and liver function tests  Chronic diastolic CHF (congestive heart failure), NYHA class 1 Most recent ejection fraction 40-45%.   Orders Placed This Encounter  Procedures  . Lipid Profile  . Comp Met (CMET)  . EKG 12-Lead   Meds ordered this encounter  Medications  . alendronate (FOSAMAX) 70 MG tablet    Sig:   . fluticasone (FLONASE) 50 MCG/ACT nasal spray    Sig:     Ginger Leeth  Thurmon Fair, MD, [facility] [phone] office [phone] pager

## 2013-04-16 NOTE — Assessment & Plan Note (Signed)
It is time for repeat lipid profile and liver function tests

## 2013-04-21 LAB — LIPID PANEL
HDL: 46 mg/dL (ref >39)
LDL Cholesterol: 74 mg/dL (ref 0–99)
Triglycerides: 390 mg/dL — ABNORMAL HIGH (ref <150)
VLDL: 78 mg/dL — ABNORMAL HIGH (ref 0–40)

## 2013-04-21 LAB — COMPREHENSIVE METABOLIC PANEL
ALT: 10 U/L (ref 0–35)
AST: 13 U/L (ref 0–37)
CO2: 24 mEq/L (ref 19–32)
Chloride: 104 mEq/L (ref 96–112)
Creat: 1.37 mg/dL — ABNORMAL HIGH (ref 0.50–1.10)
Sodium: 139 mEq/L (ref 135–145)
Total Bilirubin: 0.5 mg/dL (ref 0.3–1.2)
Total Protein: 6.7 g/dL (ref 6.0–8.3)

## 2013-04-22 ENCOUNTER — Telehealth: Payer: Self-pay | Admitting: *Deleted

## 2013-04-22 NOTE — Telephone Encounter (Signed)
Message copied by Vita Barley on Tue Apr 22, 2013  1:31 PM ------      Message from: Thurmon Fair      Created: Mon Apr 21, 2013  9:30 PM       Was this a fasting sample? TG are higher than before (which is also driving up total cholesterol). ------

## 2013-04-22 NOTE — Telephone Encounter (Signed)
Results of lipid panel called to patient.  The only thing she had prior to lab draw was a couple sips of ginger ale.  Will readjust diet to low cholesterol and decrease sugars and starches in diet.

## 2013-04-26 ENCOUNTER — Encounter: Payer: Self-pay | Admitting: Cardiovascular Disease

## 2014-03-04 ENCOUNTER — Telehealth: Payer: Self-pay | Admitting: Cardiovascular Disease

## 2014-03-13 NOTE — Telephone Encounter (Signed)
Closed encounter °

## 2014-06-01 ENCOUNTER — Encounter: Payer: Self-pay | Admitting: Cardiovascular Disease

## 2014-06-01 ENCOUNTER — Ambulatory Visit (INDEPENDENT_AMBULATORY_CARE_PROVIDER_SITE_OTHER): Payer: Medicare Other | Admitting: Cardiovascular Disease

## 2014-06-01 VITALS — BP 148/80 | HR 72 | Resp 16 | Ht 60.0 in | Wt 143.8 lb

## 2014-06-01 DIAGNOSIS — I10 Essential (primary) hypertension: Secondary | ICD-10-CM

## 2014-06-01 DIAGNOSIS — N058 Unspecified nephritic syndrome with other morphologic changes: Secondary | ICD-10-CM

## 2014-06-01 DIAGNOSIS — I428 Other cardiomyopathies: Secondary | ICD-10-CM

## 2014-06-01 DIAGNOSIS — N183 Chronic kidney disease, stage 3 unspecified: Secondary | ICD-10-CM

## 2014-06-01 DIAGNOSIS — E1129 Type 2 diabetes mellitus with other diabetic kidney complication: Secondary | ICD-10-CM

## 2014-06-01 DIAGNOSIS — I5032 Chronic diastolic (congestive) heart failure: Secondary | ICD-10-CM

## 2014-06-01 DIAGNOSIS — E7800 Pure hypercholesterolemia, unspecified: Secondary | ICD-10-CM

## 2014-06-01 DIAGNOSIS — E1121 Type 2 diabetes mellitus with diabetic nephropathy: Secondary | ICD-10-CM

## 2014-06-01 NOTE — Patient Instructions (Signed)
Your physician wants you to follow-up in: ONE YEAR WITH DR CROITORU You will receive a reminder letter in the mail two months in advance. If you don't receive a letter, please call our office to schedule the follow-up appointment.  

## 2014-06-01 NOTE — Progress Notes (Signed)
Patient ID: Kimberly Castaneda, female   DOB: [birth year], 73 y.o.   MRN: [id number]     Reason for office visit Congestive heart failure, systemic hypertension  Rainy has a long-standing history of nonischemic cardiomyopathy with left ventricular ejection fraction documented as low as 25% in the past. She had normal coronary arteries by angiography. After aggressive treatment of her severe hypertension, left ventricular ejection fraction has increased back to 45%. She has no clinical symptoms of congestive heart failure anymore. She is conscientious about sodium restriction and does not require loop diuretics.  She also has chronic kidney disease stage III, type 2 diabetes mellitus and hyperlipidemia all of which have been fairly well controlled with pharmacological therapy. She is due to have blood work when she sees Dr. Florene Glen, her nephrologist, next month.   Allergies  Allergen Reactions  . Ciprofloxacin Hcl Nausea Only  . Erythromycin   . Quinapril     Current Outpatient Prescriptions  Medication Sig Dispense Refill  . alendronate (FOSAMAX) 70 MG tablet       . amLODipine (NORVASC) 2.5 MG tablet Take 2.5 mg by mouth daily.      . Ascorbic Acid (VITA-C PO) Take 1 tablet by mouth daily.      Marland Kitchen aspirin 81 MG tablet Take 81 mg by mouth daily.      Marland Kitchen atorvastatin (LIPITOR) 20 MG tablet Take 20 mg by mouth daily.      . carvedilol (COREG) 25 MG tablet Take 25 mg by mouth 2 (two) times daily.      . cholecalciferol (VITAMIN D) 1000 UNITS tablet Take 1,000 Units by mouth daily.      . fluticasone (FLONASE) 50 MCG/ACT nasal spray       . folic acid (FOLVITE) 1 MG tablet Take 1 mg by mouth daily.      . hydrochlorothiazide (MICROZIDE) 12.5 MG capsule Take 12.5 mg by mouth daily.      . OMEGA 3 1200 MG CAPS Take 1,200 mg by mouth daily.      . ramipril (ALTACE) 10 MG tablet Take 10 mg by mouth 2 (two) times daily.       No current facility-administered medications for this visit.    Past  Medical History  Diagnosis Date  . Nonischemic cardiomyopathy   . Systemic hypertension   . Diabetes mellitus     type 2  . Vertigo   . CKD (chronic kidney disease) stage 3, GFR 30-59 ml/min   . Hypercholesterolemia   . Mitral regurgitation     Past Surgical History  Procedure Laterality Date  . Cardiac catheterization  07/18/2010    r/t nonsichemic cardiomyopathy; normal coronary anatomy; EF 20%;   Marland Kitchen US echocardiography  04/03/2012    EF 55%; mild LVH  . Tonsillectomy    . Tubal ligation      No family history on file.  History   Social History  . Marital Status: Married    Spouse Name: N/A    Number of Children: N/A  . Years of Education: N/A   Occupational History  . Not on file.   Social History Main Topics  . Smoking status: Never Smoker   . Smokeless tobacco: Not on file  . Alcohol Use: No  . Drug Use: No  . Sexual Activity: Not on file   Other Topics Concern  . Not on file   Social History Narrative  . No narrative on file    Review of systems: The patient specifically denies  any chest pain at rest or with exertion, dyspnea at rest or with exertion, orthopnea, paroxysmal nocturnal dyspnea, syncope, palpitations, focal neurological deficits, intermittent claudication, lower extremity edema, unexplained weight gain, cough, hemoptysis or wheezing.  The patient also denies abdominal pain, nausea, vomiting, dysphagia, diarrhea, constipation, polyuria, polydipsia, dysuria, hematuria, frequency, urgency, abnormal bleeding or bruising, fever, chills, unexpected weight changes, mood swings, change in skin or hair texture, change in voice quality, auditory or visual problems, allergic reactions or rashes, new musculoskeletal complaints other than usual "aches and pains".   PHYSICAL EXAM BP 148/80  Pulse 72  Resp 16  Ht 5' (1.524 m)  Wt 143 lb 12.8 oz (65.227 kg)  BMI 28.08 kg/m2  General: Alert, oriented x3, no distress Head: no evidence of trauma, PERRL,  EOMI, no exophtalmos or lid lag, no myxedema, no xanthelasma; normal ears, nose and oropharynx Neck: normal jugular venous pulsations and no hepatojugular reflux; brisk carotid pulses without delay and no carotid bruits Chest: clear to auscultation, no signs of consolidation by percussion or palpation, normal fremitus, symmetrical and full respiratory excursions Cardiovascular: normal position and quality of the apical impulse, regular rhythm, normal first and second heart sounds, no murmurs, rubs or gallops Abdomen: no tenderness or distention, no masses by palpation, no abnormal pulsatility or arterial bruits, normal bowel sounds, no hepatosplenomegaly Extremities: no clubbing, cyanosis or edema; 2+ radial, ulnar and brachial pulses bilaterally; 2+ right femoral, posterior tibial and dorsalis pedis pulses; 2+ left femoral, posterior tibial and dorsalis pedis pulses; no subclavian or femoral bruits Neurological: grossly nonfocal   EKG: Normal sinus rhythm with poor R-wave progression left axis deviation, unchanged  Lipid Panel     Component Value Date/Time   CHOL 198 04/21/2013 0844   TRIG 390* 04/21/2013 0844   HDL 46 04/21/2013 0844   CHOLHDL 4.3 04/21/2013 0844   VLDL 78* 04/21/2013 0844   LDLCALC 74 04/21/2013 0844    BMET    Component Value Date/Time   NA 139 04/21/2013 0844   K 5.1 04/21/2013 0844   CL 104 04/21/2013 0844   CO2 24 04/21/2013 0844   GLUCOSE 135* 04/21/2013 0844   BUN 31* 04/21/2013 0844   CREATININE 1.37* 04/21/2013 0844   CREATININE 1.10 11/25/2010 0457   CALCIUM 10.1 04/21/2013 0844   GFRNONAA 49* 11/25/2010 0457   GFRAA  Value: 60        The eGFR has been calculated using the MDRD equation. This calculation has not been validated in all clinical situations. eGFR's persistently <60 mL/min signify possible Chronic Kidney Disease.* 11/25/2010 0457     ASSESSMENT AND PLAN Nonischemic cardiomyopathy  Upon presentation with congestive heart failure her ejection fraction  was as low as 25%, but this improved dramatically with heart failure/antihypertensive medication. This suggests that the bulk of her cardiomyopathy may simply have been related to untreated systemic hypertension. NYHA  functional class I and appears to be euvolemic. She is doing good job with sodium restriction. I reminded her about the importance of daily weight monitoring and the signs and symptoms of congestive heart failure.  Systemic hypertension  Her blood pressure slightly high today but she was upset about some issues with her insurance and lab tests CKD (chronic kidney disease) stage 3, GFR 30-59 ml/min  Will have labs done when she sees Dr. Florene Glen next month. I gave her a lab slip to also check her lipids and LFTs at that time  Diabetes mellitus, type 2  She reports good glycemic control.  Hypercholesterolemia  It is time for repeat lipid profile and liver function tests  Chronic diastolic CHF (congestive heart failure), NYHA class 1  Most recent ejection fraction 40-45%.  Orders Placed This Encounter  Procedures  . Lipid panel  . Comp Met (CMET)  . EKG 12-Lead   No orders of the defined types were placed in this encounter.    Holli Humbles, MD, [place] [phone] office [phone] pager

## 2014-08-20 ENCOUNTER — Other Ambulatory Visit: Payer: Self-pay | Admitting: Family Medicine

## 2014-08-20 DIAGNOSIS — D179 Benign lipomatous neoplasm, unspecified: Secondary | ICD-10-CM

## 2014-08-24 ENCOUNTER — Ambulatory Visit
Admission: RE | Admit: 2014-08-24 | Discharge: 2014-08-24 | Disposition: A | Discharge status: Home / Self Care | Payer: Medicare Other | Source: Ambulatory Visit | Attending: Family Medicine | Admitting: Family Medicine

## 2014-08-24 DIAGNOSIS — D179 Benign lipomatous neoplasm, unspecified: Secondary | ICD-10-CM

## 2015-03-02 ENCOUNTER — Telehealth: Payer: Self-pay | Admitting: Cardiovascular Disease

## 2015-03-02 NOTE — Telephone Encounter (Signed)
Closed encounter °

## 2015-06-09 ENCOUNTER — Ambulatory Visit (INDEPENDENT_AMBULATORY_CARE_PROVIDER_SITE_OTHER): Payer: Medicare Other | Admitting: Cardiovascular Disease

## 2015-06-09 VITALS — BP 132/76 | HR 72 | Resp 16 | Ht 61.5 in | Wt 152.4 lb

## 2015-06-09 DIAGNOSIS — I429 Cardiomyopathy, unspecified: Secondary | ICD-10-CM | POA: Diagnosis not present

## 2015-06-09 DIAGNOSIS — I5032 Chronic diastolic (congestive) heart failure: Secondary | ICD-10-CM

## 2015-06-09 DIAGNOSIS — I428 Other cardiomyopathies: Secondary | ICD-10-CM

## 2015-06-09 DIAGNOSIS — E7800 Pure hypercholesterolemia, unspecified: Secondary | ICD-10-CM

## 2015-06-09 DIAGNOSIS — I10 Essential (primary) hypertension: Secondary | ICD-10-CM

## 2015-06-09 NOTE — Patient Instructions (Signed)
Your physician wants you to follow-up in: 1 Year. You will receive a reminder letter in the mail two months in advance. If you don't receive a letter, please call our office to schedule the follow-up appointment.  Your physician has recommended you make the following change in your medication: STOP Norvasc

## 2015-06-09 NOTE — Progress Notes (Signed)
Patient ID: Kimberly Castaneda, female   DOB: 04/13/1941, 74 y.o.   MRN: [id number]     Cardiology Office Note   Date:  06/11/2015   ID:  Kimberly Castaneda, DOB January 30, 1941, MRN [id number]  PCP:  Kimberly Schwalbe, MD  Cardiologist:   Kimberly Klein, MD   Chief Complaint  Patient presents with  . Annual Exam      History of Present Illness: Kimberly Castaneda is a 74 y.o. female who presents for follow-up for congestive heart failure related to nonischemic cardiomyopathy in the setting of systemic hypertension and chronic kidney disease. At presentation left ventricular systolic function was severely depressed with EF of 20%, this subsequently improved to 45-50% after treatment with antihypertensives including ace inhibitors and carvedilol. Her blood pressure in the office today is in normal range, but at home she frequently has low blood pressure (mid 90s/50s) and the highest blood pressure ever recorded in the last few months was 126/76. She denies exertional dyspnea, edema, orthopnea, PND, chest pain, palpitations, syncope. She only becomes short of breath when the weather is very humid.  Had labs performed recently with Dr. Harlan Castaneda. Her creatinine was 1.74. She has not seen Dr. Florene Castaneda, her nephrologist this year    Past Medical History  Diagnosis Date  . Nonischemic cardiomyopathy   . Systemic hypertension   . Diabetes mellitus     type 2  . Vertigo   . CKD (chronic kidney disease) stage 3, GFR 30-59 ml/min   . Hypercholesterolemia   . Mitral regurgitation     Past Surgical History  Procedure Laterality Date  . Cardiac catheterization  07/18/2010    r/t nonsichemic cardiomyopathy; normal coronary anatomy; EF 20%;   Marland Kitchen US echocardiography  04/03/2012    EF 55%; mild LVH  . Tonsillectomy    . Tubal ligation       Current Outpatient Prescriptions  Medication Sig Dispense Refill  . alendronate (FOSAMAX) 70 MG tablet     . Ascorbic Acid (VITA-C PO) Take 1 tablet by mouth daily.     Marland Kitchen aspirin 81 MG tablet Take 81 mg by mouth daily.    Marland Kitchen atorvastatin (LIPITOR) 20 MG tablet Take 20 mg by mouth daily.    . carvedilol (COREG) 25 MG tablet Take 25 mg by mouth 2 (two) times daily.    . cholecalciferol (VITAMIN D) 1000 UNITS tablet Take 1,000 Units by mouth daily.    . fluticasone (FLONASE) 50 MCG/ACT nasal spray     . folic acid (FOLVITE) 1 MG tablet Take 1 mg by mouth daily.    . hydrochlorothiazide (MICROZIDE) 12.5 MG capsule Take 12.5 mg by mouth daily.    . OMEGA 3 1200 MG CAPS Take 1,200 mg by mouth daily.    . ramipril (ALTACE) 10 MG tablet Take 10 mg by mouth 2 (two) times daily.     No current facility-administered medications for this visit.    Allergies:   Ciprofloxacin hcl; Erythromycin; and Quinapril    Social History:  The patient  reports that she has never smoked. She does not have any smokeless tobacco history on file. She reports that she does not drink alcohol or use illicit drugs.   ROS:  Please see the history of present illness.    Otherwise, review of systems positive for none.   All other systems are reviewed and negative.    PHYSICAL EXAM: VS:  BP 132/76 mmHg  Pulse 72  Ht 5' 1.5" (1.562 m)  Wt 152 lb 6.4  oz (69.128 kg)  BMI 28.33 kg/m2 , BMI Body mass index is 28.33 kg/(m^2).  General: Alert, oriented x3, no distress Head: no evidence of trauma, PERRL, EOMI, no exophtalmos or lid lag, no myxedema, no xanthelasma; normal ears, nose and oropharynx Neck: normal jugular venous pulsations and no hepatojugular reflux; brisk carotid pulses without delay and no carotid bruits Chest: clear to auscultation, no signs of consolidation by percussion or palpation, normal fremitus, symmetrical and full respiratory excursions Cardiovascular: normal position and quality of the apical impulse, regular rhythm, normal first and second heart sounds, no murmurs, rubs or gallops Abdomen: no tenderness or distention, no masses by palpation, no abnormal  pulsatility or arterial bruits, normal bowel sounds, no hepatosplenomegaly Extremities: no clubbing, cyanosis or edema; 2+ radial, ulnar and brachial pulses bilaterally; 2+ right femoral, posterior tibial and dorsalis pedis pulses; 2+ left femoral, posterior tibial and dorsalis pedis pulses; no subclavian or femoral bruits Neurological: grossly nonfocal Psych: euthymic mood, full affect   EKG:  EKG is ordered today. The ekg ordered today demonstrates Normal sinus rhythm with some irregularity, probably PACs versus sinus arrhythmia. No repolarization abnormalities  Recent Labs:  hemoglobin 11.6, creatinine 1.74, TSH 2.0, normal liver function tests, hemoglobin A1c 6.2%, total cholesterol 192, triglycerides 242, HDL 50, LDL 94    Lipid Panel    Component Value Date/Time   CHOL 198 04/21/2013 0844   TRIG 390* 04/21/2013 0844   HDL 46 04/21/2013 0844   CHOLHDL 4.3 04/21/2013 0844   VLDL 78* 04/21/2013 0844   LDLCALC 74 04/21/2013 0844      Wt Readings from Last 3 Encounters:  06/09/15 152 lb 6.4 oz (69.128 kg)  06/01/14 143 lb 12.8 oz (65.227 kg)  04/16/13 147 lb 6.4 oz (66.86 kg)     ASSESSMENT AND PLAN:  1.  well compensated cardiomyopathy/ heart failure (combined systolic and diastolic); euvolemic, NYHA functional class I. She is doing a good job with sodium restriction. I reminded her about the importance of daily weight monitoring and the signs and symptoms of congestive heart failure. 2. Essential hypertension, possibly a little overtreated. We'll discontinue amlodipine. Continue full dose carvedilol and ramipril and low dose thiazide. 3. Hyperlipidemia with well treated total and LDL cholesterol levels, with some residual hypertriglyceridemia that has actually improved from previous labs. Weight loss and restriction intake of carbohydrates is recommended. Regular physical activity will be helpful. I don't think medications need to be changed. 4. Chronic kidney disease stage  III. I encouraged her to to see Dr. Florene Castaneda on a yearly basis 5. Type 2 diabetes mellitus, controlled with diet    Current medicines are reviewed at length with the patient today.  The patient does not have concerns regarding medicines.  The following changes have been made:  Stop amlodipine   Labs/ tests ordered today include:  Orders Placed This Encounter  Procedures  . EKG 12-Lead   Patient Instructions  Your physician wants you to follow-up in: 1 Year. You will receive a reminder letter in the mail two months in advance. If you don't receive a letter, please call our office to schedule the follow-up appointment.  Your physician has recommended you make the following change in your medication: STOP Norvasc       Signed, Kimberly Klein, MD  06/11/2015 9:09 AM    Kimberly Klein, MD, [facility] [phone] office [phone] pager

## 2015-06-11 ENCOUNTER — Encounter: Payer: Self-pay | Admitting: Cardiovascular Disease

## 2016-06-22 ENCOUNTER — Encounter: Payer: Self-pay | Admitting: Cardiovascular Disease

## 2016-07-05 ENCOUNTER — Encounter: Payer: Self-pay | Admitting: Cardiovascular Disease

## 2016-07-05 ENCOUNTER — Ambulatory Visit (INDEPENDENT_AMBULATORY_CARE_PROVIDER_SITE_OTHER): Payer: Medicare Other | Admitting: Cardiovascular Disease

## 2016-07-05 VITALS — BP 140/72 | HR 72 | Ht 61.5 in | Wt 154.6 lb

## 2016-07-05 DIAGNOSIS — I10 Essential (primary) hypertension: Secondary | ICD-10-CM | POA: Diagnosis not present

## 2016-07-05 DIAGNOSIS — I428 Other cardiomyopathies: Secondary | ICD-10-CM

## 2016-07-05 DIAGNOSIS — E7800 Pure hypercholesterolemia, unspecified: Secondary | ICD-10-CM

## 2016-07-05 DIAGNOSIS — E1121 Type 2 diabetes mellitus with diabetic nephropathy: Secondary | ICD-10-CM

## 2016-07-05 DIAGNOSIS — I429 Cardiomyopathy, unspecified: Secondary | ICD-10-CM | POA: Diagnosis not present

## 2016-07-05 DIAGNOSIS — I5032 Chronic diastolic (congestive) heart failure: Secondary | ICD-10-CM

## 2016-07-05 DIAGNOSIS — N183 Chronic kidney disease, stage 3 unspecified: Secondary | ICD-10-CM

## 2016-07-05 NOTE — Patient Instructions (Signed)
Dr Croitoru recommends that you schedule a follow-up appointment in 1 year. You will receive a reminder letter in the mail two months in advance. If you don't receive a letter, please call our office to schedule the follow-up appointment.  If you need a refill on your cardiac medications before your next appointment, please call your pharmacy. 

## 2016-07-05 NOTE — Progress Notes (Signed)
Cardiology Office Note    Date:  07/05/2016   ID:  Kimberly Castaneda, DOB 04/13/1941, MRN [id number]  PCP:  Vidal Schwalbe, MD  Cardiologist:   Sanda Klein, MD   Chief Complaint  Patient presents with  . Follow-up    History of Present Illness:  Kimberly Castaneda is a 75 y.o. female with long-standing nonischemic cardiomyopathy, severe systemic hypertension, type 2 diabetes mellitus and chronic kidney disease. At her initial presentation left ventricular ejection fraction was 20%. Coronary angiography 2011 showed normal coronary arteries. After treatment with ace inhibitors and beta blockers her most recent EF was 45-50%. She also has treated hypercholesterolemia. Her last hemoglobin A1c was reportedly over 7% and she has started treatment with metformin.  She denies ankle edema, shortness of breath at rest or with activity, palpitations, angina pectoris, dizziness or syncope, focal neurological events, intermittent claudication, polyuria/polydipsia, major weight changes, intolerance to heat or cold, abdominal pain, change in bowel pattern, gastrointestinal bleeding, cough/hemoptysis, fever or chills.  She feels the best she has in years. Her 60 year old mother passed away in [place] earlier this year, but she was very glad to have spent Christmas with her.  She just had labs performed with Dr. [name], but I don't have those results yet. Kimberly Castaneda's husband Kimberly Castaneda is also our patient.    Past Medical History:  Diagnosis Date  . CKD (chronic kidney disease) stage 3, GFR 30-59 ml/min   . Diabetes mellitus    type 2  . Hypercholesterolemia   . Mitral regurgitation   . Nonischemic cardiomyopathy ([place])   . Systemic hypertension   . Vertigo     Past Surgical History:  Procedure Laterality Date  . CARDIAC CATHETERIZATION  07/18/2010   r/t nonsichemic cardiomyopathy; normal coronary anatomy; EF 20%;   . TONSILLECTOMY    . TUBAL LIGATION    . US ECHOCARDIOGRAPHY  04/03/2012   EF  55%; mild LVH    Current Medications: Outpatient Medications Prior to Visit  Medication Sig Dispense Refill  . alendronate (FOSAMAX) 70 MG tablet Take 70 mg by mouth once a week.     . Ascorbic Acid (VITA-C PO) Take 1 tablet by mouth daily.    Marland Kitchen aspirin 81 MG tablet Take 81 mg by mouth daily.    Marland Kitchen atorvastatin (LIPITOR) 20 MG tablet Take 20 mg by mouth daily.    . carvedilol (COREG) 25 MG tablet Take 25 mg by mouth 2 (two) times daily.    . cholecalciferol (VITAMIN D) 1000 UNITS tablet Take 1,000 Units by mouth daily.    . fluticasone (FLONASE) 50 MCG/ACT nasal spray     . folic acid (FOLVITE) 1 MG tablet Take 1 mg by mouth daily.    . hydrochlorothiazide (MICROZIDE) 12.5 MG capsule Take 12.5 mg by mouth daily.    . OMEGA 3 1200 MG CAPS Take 1,200 mg by mouth daily.    . ramipril (ALTACE) 10 MG tablet Take 10 mg by mouth 2 (two) times daily.     No facility-administered medications prior to visit.      Allergies:   Erythromycin; Quinapril; and Ciprofloxacin hcl   Social History   Social History  . Marital status: Married    Spouse name: N/A  . Number of children: N/A  . Years of education: N/A   Social History Main Topics  . Smoking status: Never Smoker  . Smokeless tobacco: Not on file  . Alcohol use No  . Drug use: No  . Sexual activity: Not  on file   Other Topics Concern  . Not on file   Social History Narrative  . No narrative on file      ROS:   Please see the history of present illness.    ROS All other systems reviewed and are negative.   PHYSICAL EXAM:   VS:  BP 140/72 (BP Location: Right Arm, Patient Position: Sitting, Cuff Size: Normal)   Pulse 72   Ht 5' 1.5" (1.562 m)   Wt 154 lb 9.6 oz (70.1 kg)   SpO2 97%   BMI 28.74 kg/m    GEN: Well nourished, well developed, in no acute distress  HEENT: normal  Neck: no JVD, carotid bruits, or masses Cardiac: RRR; no murmurs, rubs, or gallops,no edema  Respiratory:  clear to auscultation bilaterally,  normal work of breathing GI: soft, nontender, nondistended, + BS MS: no deformity or atrophy  Skin: warm and dry, no rash Neuro:  Alert and Oriented x 3, Strength and sensation are intact Psych: euthymic mood, full affect  Wt Readings from Last 3 Encounters:  07/05/16 154 lb 9.6 oz (70.1 kg)  06/09/15 152 lb 6.4 oz (69.1 kg)  06/01/14 143 lb 12.8 oz (65.2 kg)      Studies/Labs Reviewed:   EKG:  EKG is ordered today.  The ekg ordered today demonstrates Sinus rhythm with sinus arrhythmia and mild nonspecific ST segment changes, not much different from previous tracings, QTC 433 ms  Recent Labs: No results found for requested labs within last 8760 hours.   Lipid Panel    Component Value Date/Time   CHOL 198 04/21/2013 0844   TRIG 390 (H) 04/21/2013 0844   HDL 46 04/21/2013 0844   CHOLHDL 4.3 04/21/2013 0844   VLDL 78 (H) 04/21/2013 0844   LDLCALC 74 04/21/2013 0844      ASSESSMENT:    1. Chronic diastolic CHF (congestive heart failure), NYHA class 1 ([place])   2. Nonischemic cardiomyopathy ([place])      PLAN:  In order of problems listed above:  1. CHF: Appears clinically euvolemic, NYHA functional class I, on maximum doses of carvedilol and ACE inhibitor and not requiring loop diuretic therapy. She is very conscientious with sodium restriction. No changes made to current medical regimen. 2. HTN: Good blood pressure control. Recheck blood pressure was 1:30/76 mmHg. 3. CKD: I don't have her most recent labs but will retrieve them from her primary care provider 4. HLP: Had an excellent lipid profile on this medical regimen in the past, but will review the most recent labs. Note recent initiation of medicine for diabetes mellitus. Target LDL less than 100.    Medication Adjustments/Labs and Tests Ordered: Current medicines are reviewed at length with the patient today.  Concerns regarding medicines are outlined above.  Medication changes, Labs and Tests ordered today are  listed in the Patient Instructions below. Patient Instructions  Dr Sallyanne Kuster recommends that you schedule a follow-up appointment in 1 year. You will receive a reminder letter in the mail two months in advance. If you don't receive a letter, please call our office to schedule the follow-up appointment.  If you need a refill on your cardiac medications before your next appointment, please call your pharmacy.    Signed, Sanda Klein, MD  07/05/2016 9:02 AM    [facility] [address], [place], [place]  [address] Phone: [phone]; Fax: [phone]

## 2017-07-05 ENCOUNTER — Encounter: Payer: Self-pay | Admitting: Cardiology

## 2017-07-05 ENCOUNTER — Telehealth: Payer: Self-pay | Admitting: *Deleted

## 2017-07-05 ENCOUNTER — Encounter (HOSPITAL_COMMUNITY): Payer: Self-pay | Admitting: Emergency Medicine

## 2017-07-05 ENCOUNTER — Emergency Department (HOSPITAL_COMMUNITY)
Admission: EM | Admit: 2017-07-05 | Discharge: 2017-07-06 | Disposition: A | Discharge status: Home / Self Care | Payer: Medicare Other | Attending: Emergency Medicine | Admitting: Emergency Medicine

## 2017-07-05 ENCOUNTER — Ambulatory Visit (INDEPENDENT_AMBULATORY_CARE_PROVIDER_SITE_OTHER): Payer: Medicare Other | Admitting: Cardiology

## 2017-07-05 ENCOUNTER — Emergency Department (HOSPITAL_COMMUNITY): Payer: Medicare Other

## 2017-07-05 DIAGNOSIS — E872 Acidosis, unspecified: Secondary | ICD-10-CM

## 2017-07-05 DIAGNOSIS — Z79899 Other long term (current) drug therapy: Secondary | ICD-10-CM | POA: Diagnosis not present

## 2017-07-05 DIAGNOSIS — Z7982 Long term (current) use of aspirin: Secondary | ICD-10-CM | POA: Insufficient documentation

## 2017-07-05 DIAGNOSIS — IMO0001 Reserved for inherently not codable concepts without codable children: Secondary | ICD-10-CM | POA: Insufficient documentation

## 2017-07-05 DIAGNOSIS — E1122 Type 2 diabetes mellitus with diabetic chronic kidney disease: Secondary | ICD-10-CM | POA: Insufficient documentation

## 2017-07-05 DIAGNOSIS — E785 Hyperlipidemia, unspecified: Secondary | ICD-10-CM

## 2017-07-05 DIAGNOSIS — E119 Type 2 diabetes mellitus without complications: Secondary | ICD-10-CM

## 2017-07-05 DIAGNOSIS — R531 Weakness: Secondary | ICD-10-CM

## 2017-07-05 DIAGNOSIS — I5032 Chronic diastolic (congestive) heart failure: Secondary | ICD-10-CM | POA: Diagnosis not present

## 2017-07-05 DIAGNOSIS — N183 Chronic kidney disease, stage 3 unspecified: Secondary | ICD-10-CM

## 2017-07-05 DIAGNOSIS — N390 Urinary tract infection, site not specified: Secondary | ICD-10-CM | POA: Insufficient documentation

## 2017-07-05 DIAGNOSIS — I130 Hypertensive heart and chronic kidney disease with heart failure and stage 1 through stage 4 chronic kidney disease, or unspecified chronic kidney disease: Secondary | ICD-10-CM | POA: Insufficient documentation

## 2017-07-05 DIAGNOSIS — R5383 Other fatigue: Secondary | ICD-10-CM | POA: Diagnosis not present

## 2017-07-05 DIAGNOSIS — Z0389 Encounter for observation for other suspected diseases and conditions ruled out: Secondary | ICD-10-CM

## 2017-07-05 DIAGNOSIS — I10 Essential (primary) hypertension: Secondary | ICD-10-CM | POA: Diagnosis not present

## 2017-07-05 DIAGNOSIS — I951 Orthostatic hypotension: Secondary | ICD-10-CM | POA: Diagnosis not present

## 2017-07-05 DIAGNOSIS — I428 Other cardiomyopathies: Secondary | ICD-10-CM | POA: Diagnosis not present

## 2017-07-05 LAB — URINALYSIS, ROUTINE W REFLEX MICROSCOPIC
Bilirubin Urine: NEGATIVE
GLUCOSE, UA: NEGATIVE mg/dL
Ketones, ur: NEGATIVE mg/dL
Nitrite: NEGATIVE
PROTEIN: 100 mg/dL — AB
SPECIFIC GRAVITY, URINE: 1.025 (ref 1.005–1.030)
pH: 5 (ref 5.0–8.0)

## 2017-07-05 LAB — COMPREHENSIVE METABOLIC PANEL
ALBUMIN: 3 g/dL — AB (ref 3.5–5.0)
ALK PHOS: 99 U/L (ref 38–126)
ALT: 7 U/L — AB (ref 14–54)
AST: 15 U/L (ref 15–41)
Anion gap: 11 (ref 5–15)
BILIRUBIN TOTAL: 0.4 mg/dL (ref 0.3–1.2)
BUN: 34 mg/dL — AB (ref 6–20)
CALCIUM: 10.7 mg/dL — AB (ref 8.9–10.3)
CO2: 22 mmol/L (ref 22–32)
CREATININE: 1.95 mg/dL — AB (ref 0.44–1.00)
Chloride: 101 mmol/L (ref 101–111)
GFR calc Af Amer: 28 mL/min — ABNORMAL LOW (ref >60)
GFR calc non Af Amer: 24 mL/min — ABNORMAL LOW (ref >60)
GLUCOSE: 205 mg/dL — AB (ref 65–99)
Potassium: 4.1 mmol/L (ref 3.5–5.1)
SODIUM: 134 mmol/L — AB (ref 135–145)
TOTAL PROTEIN: 6.9 g/dL (ref 6.5–8.1)

## 2017-07-05 LAB — CBC
Hematocrit: 31.6 % — ABNORMAL LOW (ref 34.0–46.6)
Hemoglobin: 10.4 g/dL — ABNORMAL LOW (ref 11.1–15.9)
MCH: 29.1 pg (ref 26.6–33.0)
MCHC: 32.9 g/dL (ref 31.5–35.7)
MCV: 88 fL (ref 79–97)
Platelets: 407 10*3/uL — ABNORMAL HIGH (ref 150–379)
RBC: 3.58 x10E6/uL — ABNORMAL LOW (ref 3.77–5.28)
RDW: 15.1 % (ref 12.3–15.4)
WBC: 23.2 10*3/uL (ref 3.4–10.8)

## 2017-07-05 LAB — CBC WITH DIFFERENTIAL/PLATELET
BASOS PCT: 0 %
Basophils Absolute: 0 10*3/uL (ref 0.0–0.1)
EOS ABS: 0.1 10*3/uL (ref 0.0–0.7)
EOS PCT: 0 %
HEMATOCRIT: 32.3 % — AB (ref 36.0–46.0)
Hemoglobin: 10.3 g/dL — ABNORMAL LOW (ref 12.0–15.0)
Lymphocytes Relative: 6 %
Lymphs Abs: 1.4 10*3/uL (ref 0.7–4.0)
MCH: 28.6 pg (ref 26.0–34.0)
MCHC: 31.9 g/dL (ref 30.0–36.0)
MCV: 89.7 fL (ref 78.0–100.0)
MONO ABS: 1.4 10*3/uL — AB (ref 0.1–1.0)
MONOS PCT: 6 %
Neutro Abs: 19.2 10*3/uL — ABNORMAL HIGH (ref 1.7–7.7)
Neutrophils Relative %: 88 %
PLATELETS: 420 10*3/uL — AB (ref 150–400)
RBC: 3.6 MIL/uL — ABNORMAL LOW (ref 3.87–5.11)
RDW: 15.4 % (ref 11.5–15.5)
WBC: 22.1 10*3/uL — ABNORMAL HIGH (ref 4.0–10.5)

## 2017-07-05 LAB — BASIC METABOLIC PANEL
BUN/Creatinine Ratio: 18 (ref 12–28)
BUN: 34 mg/dL — ABNORMAL HIGH (ref 8–27)
CO2: 21 mmol/L (ref 20–29)
Calcium: 11.4 mg/dL — ABNORMAL HIGH (ref 8.7–10.3)
Chloride: 98 mmol/L (ref 96–106)
Creatinine, Ser: 1.89 mg/dL — ABNORMAL HIGH (ref 0.57–1.00)
GFR calc Af Amer: 29 mL/min/{1.73_m2} — ABNORMAL LOW (ref >59)
GFR calc non Af Amer: 26 mL/min/{1.73_m2} — ABNORMAL LOW (ref >59)
Glucose: 206 mg/dL — ABNORMAL HIGH (ref 65–99)
Potassium: 5.9 mmol/L — ABNORMAL HIGH (ref 3.5–5.2)
Sodium: 135 mmol/L (ref 134–144)

## 2017-07-05 LAB — I-STAT TROPONIN, ED: Troponin i, poc: 0.01 ng/mL (ref 0.00–0.08)

## 2017-07-05 LAB — I-STAT CG4 LACTIC ACID, ED
LACTIC ACID, VENOUS: 1.99 mmol/L — AB (ref 0.5–1.9)
Lactic Acid, Venous: 2.67 mmol/L (ref 0.5–1.9)

## 2017-07-05 MED ORDER — CARVEDILOL 12.5 MG PO TABS: 12.5000 mg | ORAL_TABLET | Freq: Two times a day (BID) | ORAL | 0 refills | Status: DC

## 2017-07-05 MED ORDER — SODIUM CHLORIDE 0.9 % IV BOLUS (SEPSIS)
1000.0000 mL | Freq: Once | INTRAVENOUS | Status: AC
  Administered 2017-07-05: 1000 mL via INTRAVENOUS

## 2017-07-05 MED ORDER — DEXTROSE 5 % IV SOLN
1.0000 g | Freq: Once | INTRAVENOUS | Status: AC
  Administered 2017-07-05: 1 g via INTRAVENOUS
  Filled 2017-07-05: qty 10

## 2017-07-05 MED ORDER — CEPHALEXIN 500 MG PO CAPS: 500.0000 mg | ORAL_CAPSULE | Freq: Two times a day (BID) | ORAL | 0 refills | Status: AC

## 2017-07-05 NOTE — ED Notes (Signed)
Elevated CG-4 reported to [facility] @ [facility]

## 2017-07-05 NOTE — ED Provider Notes (Signed)
[name]   CSN: [id number] Arrival date & time: 07/05/17  1720     History   Chief Complaint Chief Complaint  Patient presents with  . Fatigue    HPI Kimberly Castaneda is a 76 y.o. female.  HPI  Patient presents with concern of generalized weakness, fatigue. Patient has been symptomatic for several weeks, without clear precipitant, has been persistently ill. No focal new consistent pain anywhere though she notes that she has had episodes of pain in multiple different areas over the past few weeks, transient, nonsustained, sharp. No persistent diarrhea, no nausea, no vomiting. She does complain of polyuria. Today, with persistent illness she expedited a planned outpatient cardiology evaluation. There, after her initial evaluation she was reportedly found to have leukocytosis, and was sent here for evaluation.   Past Medical History:  Diagnosis Date  . CKD (chronic kidney disease) stage 3, GFR 30-59 ml/min   . Diabetes mellitus ([place])    type 2  . Hypercholesterolemia   . Mitral regurgitation   . Nonischemic cardiomyopathy ([place])   . Systemic hypertension   . Vertigo     Patient Active Problem List   Diagnosis Date Noted  . Normal coronary arteries 07/05/2017  . Weakness 07/05/2017  . Orthostatic hypotension 07/05/2017  . Chronic diastolic CHF (congestive heart failure), NYHA class 1 ([place]) 04/16/2013  . Nonischemic cardiomyopathy ([place])   . Systemic hypertension   . CKD (chronic kidney disease) stage 3, GFR 30-59 ml/min   . Non-insulin dependent type 2 diabetes mellitus ([place])   . Dyslipidemia     Past Surgical History:  Procedure Laterality Date  . CARDIAC CATHETERIZATION  07/18/2010   r/t nonsichemic cardiomyopathy; normal coronary anatomy; EF 20%;   . TONSILLECTOMY    . TUBAL LIGATION    . US ECHOCARDIOGRAPHY  04/03/2012   EF 55%; mild LVH    OB History    No data available       Home Medications    Prior to Admission  medications   Medication Sig Start Date End Date Taking? Authorizing Provider  alendronate (FOSAMAX) 70 MG tablet Take 70 mg by mouth once a week.  04/02/13   [provider]  Ascorbic Acid (VITA-C PO) Take 1 tablet by mouth daily.    [provider]  aspirin 81 MG tablet Take 81 mg by mouth daily.    [provider]  atorvastatin (LIPITOR) 20 MG tablet Take 20 mg by mouth daily.    [provider]  carvedilol (COREG) 12.5 MG tablet Take 1 tablet (12.5 mg total) by mouth 2 (two) times daily. 07/05/17   Erlene Quan, PA-C  cholecalciferol (VITAMIN D) 1000 UNITS tablet Take 1,000 Units by mouth daily.    [provider]  fluticasone Asencion Islam) 50 MCG/ACT nasal spray  02/17/13   [provider]  folic acid (FOLVITE) 1 MG tablet Take 1 mg by mouth daily.    [provider]  hydrochlorothiazide (MICROZIDE) 12.5 MG capsule Take 12.5 mg by mouth daily.    [provider]  metformin (FORTAMET) 500 MG (OSM) 24 hr tablet Take 500 mg by mouth daily.    [provider]  OMEGA 3 1200 MG CAPS Take 1,200 mg by mouth daily.    [provider]  ramipril (ALTACE) 10 MG tablet Take 10 mg by mouth 2 (two) times daily.    [provider]    Family History No family history on file.  Social History Social History  Substance Use Topics  . Smoking status: Never Smoker  . Smokeless tobacco: Never Used  . Alcohol use No     Allergies   Erythromycin; Quinapril; and Ciprofloxacin hcl   Review of Systems Review of Systems  Constitutional:       Per HPI, otherwise negative  HENT:       Per HPI, otherwise negative  Respiratory:       Per HPI, otherwise negative  Cardiovascular:       Per HPI, otherwise negative  Gastrointestinal: Negative for vomiting.  Endocrine:       Negative aside from HPI  Genitourinary:       Neg aside from HPI   Musculoskeletal:       Per HPI, otherwise negative  Skin: Negative.    Neurological: Positive for weakness. Negative for syncope.     Physical Exam Updated Vital Signs BP 112/63 (BP Location: Right Arm)   Pulse 81   Temp 98.4 F (36.9 C) (Oral)   Resp 14   SpO2 100%   Physical Exam  Constitutional: She is oriented to person, place, and time. She has a sickly appearance. No distress.  HENT:  Head: Normocephalic and atraumatic.  Eyes: Conjunctivae and EOM are normal.  Cardiovascular: Normal rate and regular rhythm.   Pulmonary/Chest: Effort normal and breath sounds normal. No stridor. No respiratory distress.  Abdominal: She exhibits no distension.    Musculoskeletal: She exhibits no edema.  Neurological: She is alert and oriented to person, place, and time. No cranial nerve deficit.  Skin: Skin is warm and dry.  Psychiatric: She has a normal mood and affect.  Nursing note and vitals reviewed.    ED Treatments / Results  Labs (all labs ordered are listed, but only abnormal results are displayed) Labs Reviewed  COMPREHENSIVE METABOLIC PANEL - Abnormal; Notable for the following:       Result Value   Sodium 134 (*)    Glucose, Bld 205 (*)    BUN 34 (*)    Creatinine, Ser 1.95 (*)    Calcium 10.7 (*)    Albumin 3.0 (*)    ALT 7 (*)    GFR calc non Af Amer 24 (*)    GFR calc Af Amer 28 (*)    All other components within normal limits  CBC WITH DIFFERENTIAL/PLATELET - Abnormal; Notable for the following:    WBC 22.1 (*)    RBC 3.60 (*)    Hemoglobin 10.3 (*)    HCT 32.3 (*)    Platelets 420 (*)    Neutro Abs 19.2 (*)    Monocytes Absolute 1.4 (*)    All other components within normal limits  URINALYSIS, ROUTINE W REFLEX MICROSCOPIC - Abnormal; Notable for the following:    APPearance TURBID (*)    Hgb urine dipstick LARGE (*)    Protein, ur 100 (*)    Leukocytes, UA SMALL (*)    Bacteria, UA MANY (*)    Squamous Epithelial / LPF TOO NUMEROUS TO COUNT (*)    Non Squamous Epithelial 6-30 (*)    All other components within normal  limits  I-STAT CG4 LACTIC ACID, ED - Abnormal; Notable for the following:    Lactic Acid, Venous 2.67 (*)    All other components within normal limits  I-STAT TROPONIN, ED  I-STAT CG4 LACTIC ACID, ED    EKG  EKG Interpretation  Date/Time:  Thursday July 05 2017 18:03:33 EDT Ventricular Rate:  97 PR Interval:  366  QRS Duration: 68 QT Interval:  332 QTC Calculation: 421 R Axis:   -119 Text Interpretation:  Sinus rhythm with 1st degree A-V block Low voltage QRS ST-t wave abnormality Artifact Abnormal ekg Confirmed by Carmin Muskrat [phone]) on 07/05/2017 9:13:47 PM       Radiology Dg Chest 2 View  Result Date: 07/05/2017 CLINICAL DATA:  Fatigue EXAM: CHEST  2 VIEW COMPARISON:  11/21/2010 FINDINGS: Hyperinflation. No focal infiltrate or effusion. Normal cardiomediastinal silhouette. No pneumothorax. Ankylosis of the spine. IMPRESSION: No active cardiopulmonary disease. Electronically Signed   By: Donavan Foil M.D.   On: 07/05/2017 18:48    Procedures Procedures (including critical care time)  Medications Ordered in ED Medications  cefTRIAXone (ROCEPHIN) 1 g in dextrose 5 % 50 mL IVPB (1 g Intravenous New Bag/Given 07/05/17 2138)  sodium chloride 0.9 % bolus 1,000 mL (1,000 mLs Intravenous New Bag/Given 07/05/17 2138)     Initial Impression / Assessment and Plan / ED Course  I have reviewed the triage vital signs and the nursing notes.  Pertinent labs & imaging results that were available during my care of the patient were reviewed by me and considered in my medical decision making (see chart for details).  Only female presents with weakness, fatigue, for several months, and eventually acknowledges polyuria as well. Here the patient is awake and alert, afebrile, with no hypotension, but does have mild lactic acidosis as well as worsening of her renal function suggesting dehydrated state, and coupled with her ongoing polyuria, signs and symptoms are consistent with urinary  tract infection with subsequent dehydration. Patient received initial antibiotics, fluid resuscitation here which was well received, and the patient noted an improvement. We discussed all findings at length, and patient elected to continue her antibiotics as an outpatient, with close outpatient follow-up, after acknowledging return precautions.   Final Clinical Impressions(s) / ED Diagnoses  Urinary tract infection Lactic acidosis Leukocytosis   Carmin Muskrat, MD 07/05/17 [phone]

## 2017-07-05 NOTE — Patient Instructions (Addendum)
Medication Instructions: DECREASE the Carvedilol to 12.5 mg tablet twice daily. Please hold your Hydrochlorothiazide and Altace until further notice.  If you need a refill on your cardiac medications before your next appointment, please call your pharmacy.   Labwork: Your physician recommends that you have the following labs drawn today: CBC and BMP   Follow-Up: Your physician recommends that you schedule a follow-up appointment in: 4 weeks with Dr. Sallyanne Kuster. Please make an appointment with your primary care physician as soon as possible.    Thank you for choosing [facility] at [facility]!!

## 2017-07-05 NOTE — Discharge Instructions (Signed)
As discussed, it is important that you follow up with your physician for continued management of your condition. ° °If you develop any new, or concerning changes in your condition, please return to the emergency department immediately. °

## 2017-07-05 NOTE — Assessment & Plan Note (Signed)
Normal coronaries 2011

## 2017-07-05 NOTE — Progress Notes (Addendum)
07/05/2017 Kimberly Castaneda   [birth year]  [id number]  Primary Physician [name], MD Primary Cardiologist: Dr Sallyanne Kuster  HPI:  76 y.o. female with long-standing nonischemic cardiomyopathy, severe systemic hypertension, type 2 diabetes mellitus and chronic kidney disease. At her initial presentation left ventricular ejection fraction was 20%. Coronary angiography 2011 showed normal coronary arteries. After treatment with ace inhibitors and beta blockers her EF was 45-50% Nov 2012. She also has treated hypercholesterolemia, and NIDDM.   Her LOV was Aug 2017. She is in the office today with complaints of weakness. She is somewhat of a difficult historian. She says this all started a couple of weeks ago when she woke up and couldn't see out of her Lt eye. She went to the eye doctor who told her she had an eyelash that had scratched her eye. This improved but she admits she was dizzy then and fell on one occasion. She denies any SOB or true syncope. She says she feels weak in the morning and better as the day goes on. She denies any unusual SOB or tachycardia. She says she has not been eating well. She denies any melena.     Current Outpatient Prescriptions  Medication Sig Dispense Refill  . alendronate (FOSAMAX) 70 MG tablet Take 70 mg by mouth once a week.     . Ascorbic Acid (VITA-C PO) Take 1 tablet by mouth daily.    Marland Kitchen aspirin 81 MG tablet Take 81 mg by mouth daily.    Marland Kitchen atorvastatin (LIPITOR) 20 MG tablet Take 20 mg by mouth daily.    . carvedilol (COREG) 25 MG tablet Take 25 mg by mouth 2 (two) times daily.    . cholecalciferol (VITAMIN D) 1000 UNITS tablet Take 1,000 Units by mouth daily.    . fluticasone (FLONASE) 50 MCG/ACT nasal spray     . folic acid (FOLVITE) 1 MG tablet Take 1 mg by mouth daily.    . hydrochlorothiazide (MICROZIDE) 12.5 MG capsule Take 12.5 mg by mouth daily.    . metformin (FORTAMET) 500 MG (OSM) 24 hr tablet Take 500 mg by mouth daily.    . OMEGA 3 1200 MG  CAPS Take 1,200 mg by mouth daily.    . ramipril (ALTACE) 10 MG tablet Take 10 mg by mouth 2 (two) times daily.     No current facility-administered medications for this visit.     Allergies  Allergen Reactions  . Erythromycin   . Quinapril   . Ciprofloxacin Hcl Nausea Only    Past Medical History:  Diagnosis Date  . CKD (chronic kidney disease) stage 3, GFR 30-59 ml/min   . Diabetes mellitus ([place])    type 2  . Hypercholesterolemia   . Mitral regurgitation   . Nonischemic cardiomyopathy ([place])   . Systemic hypertension   . Vertigo     Social History   Social History  . Marital status: Married    Spouse name: N/A  . Number of children: N/A  . Years of education: N/A   Occupational History  . Not on file.   Social History Main Topics  . Smoking status: Never Smoker  . Smokeless tobacco: Never Used  . Alcohol use No  . Drug use: No  . Sexual activity: Not on file   Other Topics Concern  . Not on file   Social History Narrative  . No narrative on file      Review of Systems: General: negative for chills, fever, night sweats or weight changes.  Cardiovascular: negative for chest pain, dyspnea on exertion, edema, orthopnea, palpitations, paroxysmal nocturnal dyspnea or shortness of breath Dermatological: negative for rash Respiratory: negative for cough or wheezing Urologic: negative for hematuria Abdominal: negative for nausea, vomiting, diarrhea, bright red blood per rectum, melena, or hematemesis Neurologic: negative for visual changes, syncope, or dizziness All other systems reviewed and are otherwise negative except as noted above.    Blood pressure 102/66, pulse 88, height 5\' 1"  (1.549 m), weight 134 lb 9.6 oz (61.1 kg).  General appearance: alert, cooperative, no distress, pale and frail  Neck: no carotid bruit and no JVD Lungs: clear to auscultation bilaterally Heart: regular rate and rhythm Abdomen: soft, non-tender; bowel sounds normal; no  masses,  no organomegaly Extremities: extremities normal, atraumatic, no cyanosis or edema Skin: pale, cool, dry Neurologic: Grossly normal  EKG NSR, LAD, poor anterior RW  ASSESSMENT AND PLAN:   Weakness Pt has vague complaints of weakness and mild anorexsia. Limited information regarding this appointment. No information in EPIC since Aug 2017  Orthostatic hypotension B/P 779 systolic laying, 70 systolic standing  Nonischemic cardiomyopathy ([place]) EF 20% 2011- improved to 55% with medical Rx 2012  Non-insulin dependent type 2 diabetes mellitus ([place]) Type 2 NIDDM, followed by PCP  Dyslipidemia Lipitor 20 mg-   CKD (chronic kidney disease) stage 3, GFR 30-59 ml/min No recent labs, followed by PCP.  Systemic hypertension Controlled  Normal coronary arteries Normal coronaries 2011   PLAN  Pt appears weak and is orthostatic. This is the first time I've seen her so I don't know her baseline. Stop HCTZ and Altace. Decrease Coreg to 12.5 mg BID. Check STAT CBC and BMP. We.ll contact her further after I see her labs results.   Kerin Ransom PA-C 07/05/2017 10:54 AM    Addendum: [name] should go to the ED for further evaluation. The elevated WBC of 22k is new. WBC Aug 2017 was 11k. The elevated WBC in conjunction with her orthostatic hypotension makes me concerned she may be septic.  Kerin Ransom PA-C 07/05/2017 5:14 PM

## 2017-07-05 NOTE — Assessment & Plan Note (Signed)
Pt has vague complaints of weakness and mild anorexsia.

## 2017-07-05 NOTE — Assessment & Plan Note (Addendum)
Type 2 NIDDM, followed by PCP

## 2017-07-05 NOTE — Assessment & Plan Note (Addendum)
No recent labs, followed by PCP.

## 2017-07-05 NOTE — Assessment & Plan Note (Signed)
Controlled.  

## 2017-07-05 NOTE — Assessment & Plan Note (Signed)
Lipitor 20mg

## 2017-07-05 NOTE — Assessment & Plan Note (Signed)
B/P 270 systolic laying, 70 systolic standing

## 2017-07-05 NOTE — Telephone Encounter (Signed)
The patient had an appointment today with Kerin Ransom, PA. Critical labs came back with elevated WBC, Potassium, and low Hemoglobin. A call was placed to the patient to advise her to go to the Emergency Department for further assessment. The patient has agreed. The ED has been notified that she is on the way.

## 2017-07-05 NOTE — ED Triage Notes (Signed)
Pt reports malaise, fatigue and loss of appetite for past several weeks, reports intermittent dysuria with foul smell, denies urinary symptoms at this time.  Pt denies recent fevers, chills, n/v/d.  Pt reports being seen at cardiologist today and being sent here to rule out infection.  Pt appears pale.

## 2017-07-05 NOTE — Assessment & Plan Note (Signed)
EF 20% 2011- improved to 55% with medical Rx 2013

## 2017-07-10 ENCOUNTER — Telehealth: Payer: Self-pay | Admitting: *Deleted

## 2017-07-10 NOTE — Telephone Encounter (Signed)
Call placed to the patient to check on how she was feeling over the weekend after her ED visit. She stated she was somewhat better. She was reminded, per Kerin Ransom, PA, from her last office visit to continue to hold the hydrochlorothiazide and Altace until further notice from her physician. She stated that she does have an appointment today with her PCP and will discuss this with her. She has been informed to call if she needs anything further. She verbalized her understanding.

## 2017-08-21 ENCOUNTER — Emergency Department (HOSPITAL_COMMUNITY): Payer: Medicare Other

## 2017-08-21 ENCOUNTER — Inpatient Hospital Stay (HOSPITAL_COMMUNITY)
Admission: EM | Admit: 2017-08-21 | Discharge: 2017-10-06 | DRG: 744 | Disposition: E | Payer: Medicare Other | Attending: Internal Medicine | Admitting: Internal Medicine

## 2017-08-21 ENCOUNTER — Encounter (HOSPITAL_COMMUNITY): Payer: Self-pay

## 2017-08-21 DIAGNOSIS — I5032 Chronic diastolic (congestive) heart failure: Secondary | ICD-10-CM | POA: Diagnosis present

## 2017-08-21 DIAGNOSIS — E785 Hyperlipidemia, unspecified: Secondary | ICD-10-CM | POA: Diagnosis not present

## 2017-08-21 DIAGNOSIS — A419 Sepsis, unspecified organism: Secondary | ICD-10-CM | POA: Diagnosis not present

## 2017-08-21 DIAGNOSIS — R339 Retention of urine, unspecified: Secondary | ICD-10-CM

## 2017-08-21 DIAGNOSIS — K2211 Ulcer of esophagus with bleeding: Secondary | ICD-10-CM | POA: Diagnosis not present

## 2017-08-21 DIAGNOSIS — E7800 Pure hypercholesterolemia, unspecified: Secondary | ICD-10-CM | POA: Diagnosis present

## 2017-08-21 DIAGNOSIS — L8990 Pressure ulcer of unspecified site, unspecified stage: Secondary | ICD-10-CM | POA: Insufficient documentation

## 2017-08-21 DIAGNOSIS — D638 Anemia in other chronic diseases classified elsewhere: Secondary | ICD-10-CM | POA: Diagnosis present

## 2017-08-21 DIAGNOSIS — R112 Nausea with vomiting, unspecified: Secondary | ICD-10-CM

## 2017-08-21 DIAGNOSIS — C541 Malignant neoplasm of endometrium: Secondary | ICD-10-CM

## 2017-08-21 DIAGNOSIS — I429 Cardiomyopathy, unspecified: Secondary | ICD-10-CM | POA: Diagnosis present

## 2017-08-21 DIAGNOSIS — J690 Pneumonitis due to inhalation of food and vomit: Secondary | ICD-10-CM | POA: Diagnosis present

## 2017-08-21 DIAGNOSIS — Z7984 Long term (current) use of oral hypoglycemic drugs: Secondary | ICD-10-CM

## 2017-08-21 DIAGNOSIS — D72829 Elevated white blood cell count, unspecified: Secondary | ICD-10-CM | POA: Diagnosis not present

## 2017-08-21 DIAGNOSIS — K3189 Other diseases of stomach and duodenum: Secondary | ICD-10-CM

## 2017-08-21 DIAGNOSIS — Z6822 Body mass index (BMI) 22.0-22.9, adult: Secondary | ICD-10-CM

## 2017-08-21 DIAGNOSIS — C539 Malignant neoplasm of cervix uteri, unspecified: Secondary | ICD-10-CM | POA: Diagnosis not present

## 2017-08-21 DIAGNOSIS — Z515 Encounter for palliative care: Secondary | ICD-10-CM

## 2017-08-21 DIAGNOSIS — D649 Anemia, unspecified: Secondary | ICD-10-CM

## 2017-08-21 DIAGNOSIS — I82432 Acute embolism and thrombosis of left popliteal vein: Secondary | ICD-10-CM | POA: Diagnosis present

## 2017-08-21 DIAGNOSIS — N179 Acute kidney failure, unspecified: Secondary | ICD-10-CM

## 2017-08-21 DIAGNOSIS — K269 Duodenal ulcer, unspecified as acute or chronic, without hemorrhage or perforation: Secondary | ICD-10-CM | POA: Diagnosis present

## 2017-08-21 DIAGNOSIS — Z7982 Long term (current) use of aspirin: Secondary | ICD-10-CM

## 2017-08-21 DIAGNOSIS — E43 Unspecified severe protein-calorie malnutrition: Secondary | ICD-10-CM | POA: Diagnosis present

## 2017-08-21 DIAGNOSIS — E860 Dehydration: Secondary | ICD-10-CM | POA: Diagnosis present

## 2017-08-21 DIAGNOSIS — R05 Cough: Secondary | ICD-10-CM

## 2017-08-21 DIAGNOSIS — G9341 Metabolic encephalopathy: Secondary | ICD-10-CM | POA: Diagnosis not present

## 2017-08-21 DIAGNOSIS — R059 Cough, unspecified: Secondary | ICD-10-CM

## 2017-08-21 DIAGNOSIS — N183 Chronic kidney disease, stage 3 unspecified: Secondary | ICD-10-CM | POA: Diagnosis present

## 2017-08-21 DIAGNOSIS — Z79899 Other long term (current) drug therapy: Secondary | ICD-10-CM

## 2017-08-21 DIAGNOSIS — R531 Weakness: Secondary | ICD-10-CM

## 2017-08-21 DIAGNOSIS — B009 Herpesviral infection, unspecified: Secondary | ICD-10-CM | POA: Diagnosis present

## 2017-08-21 DIAGNOSIS — T508X5A Adverse effect of diagnostic agents, initial encounter: Secondary | ICD-10-CM | POA: Diagnosis present

## 2017-08-21 DIAGNOSIS — D62 Acute posthemorrhagic anemia: Secondary | ICD-10-CM | POA: Diagnosis not present

## 2017-08-21 DIAGNOSIS — I82442 Acute embolism and thrombosis of left tibial vein: Secondary | ICD-10-CM | POA: Diagnosis present

## 2017-08-21 DIAGNOSIS — I130 Hypertensive heart and chronic kidney disease with heart failure and stage 1 through stage 4 chronic kidney disease, or unspecified chronic kidney disease: Secondary | ICD-10-CM | POA: Diagnosis present

## 2017-08-21 DIAGNOSIS — K922 Gastrointestinal hemorrhage, unspecified: Secondary | ICD-10-CM

## 2017-08-21 DIAGNOSIS — R0602 Shortness of breath: Secondary | ICD-10-CM

## 2017-08-21 DIAGNOSIS — R34 Anuria and oliguria: Secondary | ICD-10-CM

## 2017-08-21 DIAGNOSIS — N141 Nephropathy induced by other drugs, medicaments and biological substances: Secondary | ICD-10-CM | POA: Diagnosis present

## 2017-08-21 DIAGNOSIS — N888 Other specified noninflammatory disorders of cervix uteri: Secondary | ICD-10-CM

## 2017-08-21 DIAGNOSIS — I82412 Acute embolism and thrombosis of left femoral vein: Secondary | ICD-10-CM | POA: Diagnosis present

## 2017-08-21 DIAGNOSIS — E876 Hypokalemia: Secondary | ICD-10-CM | POA: Diagnosis present

## 2017-08-21 DIAGNOSIS — Z7983 Long term (current) use of bisphosphonates: Secondary | ICD-10-CM

## 2017-08-21 DIAGNOSIS — R55 Syncope and collapse: Secondary | ICD-10-CM | POA: Diagnosis not present

## 2017-08-21 DIAGNOSIS — Z7951 Long term (current) use of inhaled steroids: Secondary | ICD-10-CM

## 2017-08-21 DIAGNOSIS — Z4659 Encounter for fitting and adjustment of other gastrointestinal appliance and device: Secondary | ICD-10-CM

## 2017-08-21 DIAGNOSIS — E119 Type 2 diabetes mellitus without complications: Secondary | ICD-10-CM

## 2017-08-21 DIAGNOSIS — R6521 Severe sepsis with septic shock: Secondary | ICD-10-CM | POA: Diagnosis not present

## 2017-08-21 DIAGNOSIS — E872 Acidosis: Secondary | ICD-10-CM | POA: Diagnosis present

## 2017-08-21 DIAGNOSIS — Z66 Do not resuscitate: Secondary | ICD-10-CM

## 2017-08-21 DIAGNOSIS — E1122 Type 2 diabetes mellitus with diabetic chronic kidney disease: Secondary | ICD-10-CM | POA: Diagnosis present

## 2017-08-21 LAB — CBC WITH DIFFERENTIAL/PLATELET
BASOS ABS: 0 10*3/uL (ref 0.0–0.1)
Basophils Relative: 0 %
EOS PCT: 0 %
Eosinophils Absolute: 0 10*3/uL (ref 0.0–0.7)
HEMATOCRIT: 29.8 % — AB (ref 36.0–46.0)
HEMOGLOBIN: 9.6 g/dL — AB (ref 12.0–15.0)
LYMPHS ABS: 1.6 10*3/uL (ref 0.7–4.0)
Lymphocytes Relative: 5 %
MCH: 28.2 pg (ref 26.0–34.0)
MCHC: 32.2 g/dL (ref 30.0–36.0)
MCV: 87.6 fL (ref 78.0–100.0)
Monocytes Absolute: 1.3 10*3/uL — ABNORMAL HIGH (ref 0.1–1.0)
Monocytes Relative: 4 %
Neutro Abs: 29 10*3/uL — ABNORMAL HIGH (ref 1.7–7.7)
Neutrophils Relative %: 91 %
Platelets: 619 10*3/uL — ABNORMAL HIGH (ref 150–400)
RBC: 3.4 MIL/uL — AB (ref 3.87–5.11)
RDW: 14.8 % (ref 11.5–15.5)
WBC: 31.9 10*3/uL — ABNORMAL HIGH (ref 4.0–10.5)

## 2017-08-21 LAB — CBC
HEMATOCRIT: 26.8 % — AB (ref 36.0–46.0)
HEMOGLOBIN: 8.7 g/dL — AB (ref 12.0–15.0)
MCH: 28.4 pg (ref 26.0–34.0)
MCHC: 32.5 g/dL (ref 30.0–36.0)
MCV: 87.6 fL (ref 78.0–100.0)
Platelets: 654 10*3/uL — ABNORMAL HIGH (ref 150–400)
RBC: 3.06 MIL/uL — ABNORMAL LOW (ref 3.87–5.11)
RDW: 15 % (ref 11.5–15.5)
WBC: 30.6 10*3/uL — ABNORMAL HIGH (ref 4.0–10.5)

## 2017-08-21 LAB — CREATININE, SERUM
Creatinine, Ser: 0.96 mg/dL (ref 0.44–1.00)
GFR calc Af Amer: >60 mL/min (ref >60)
GFR calc non Af Amer: 56 mL/min — ABNORMAL LOW (ref >60)

## 2017-08-21 LAB — COMPREHENSIVE METABOLIC PANEL
ALBUMIN: 2.8 g/dL — AB (ref 3.5–5.0)
ALK PHOS: 135 U/L — AB (ref 38–126)
ALT: 11 U/L — AB (ref 14–54)
AST: 17 U/L (ref 15–41)
Anion gap: 14 (ref 5–15)
BUN: 31 mg/dL — ABNORMAL HIGH (ref 6–20)
CALCIUM: 12.8 mg/dL — AB (ref 8.9–10.3)
CO2: 22 mmol/L (ref 22–32)
CREATININE: 1.17 mg/dL — AB (ref 0.44–1.00)
Chloride: 102 mmol/L (ref 101–111)
GFR calc Af Amer: 51 mL/min — ABNORMAL LOW (ref >60)
GFR calc non Af Amer: 44 mL/min — ABNORMAL LOW (ref >60)
GLUCOSE: 169 mg/dL — AB (ref 65–99)
Potassium: 4.9 mmol/L (ref 3.5–5.1)
SODIUM: 138 mmol/L (ref 135–145)
Total Bilirubin: 0.7 mg/dL (ref 0.3–1.2)
Total Protein: 7 g/dL (ref 6.5–8.1)

## 2017-08-21 LAB — URINALYSIS, ROUTINE W REFLEX MICROSCOPIC
Bilirubin Urine: NEGATIVE
Glucose, UA: NEGATIVE mg/dL
Hgb urine dipstick: NEGATIVE
Ketones, ur: 5 mg/dL — AB
Leukocytes, UA: NEGATIVE
NITRITE: NEGATIVE
PROTEIN: NEGATIVE mg/dL
Specific Gravity, Urine: 1.017 (ref 1.005–1.030)
pH: 5 (ref 5.0–8.0)

## 2017-08-21 LAB — CK: CK TOTAL: 52 U/L (ref 38–234)

## 2017-08-21 LAB — CBG MONITORING, ED: GLUCOSE-CAPILLARY: 133 mg/dL — AB (ref 65–99)

## 2017-08-21 LAB — GLUCOSE, CAPILLARY: GLUCOSE-CAPILLARY: 268 mg/dL — AB (ref 65–99)

## 2017-08-21 LAB — HEMOGLOBIN A1C
Hgb A1c MFr Bld: 6.4 % — ABNORMAL HIGH (ref 4.8–5.6)
MEAN PLASMA GLUCOSE: 136.98 mg/dL

## 2017-08-21 LAB — TSH: TSH: 0.535 u[IU]/mL (ref 0.350–4.500)

## 2017-08-21 LAB — TROPONIN I: Troponin I: <0.03 ng/mL (ref <0.03)

## 2017-08-21 LAB — LACTATE DEHYDROGENASE: LDH: 153 U/L (ref 98–192)

## 2017-08-21 MED ORDER — ATORVASTATIN CALCIUM 10 MG PO TABS
20.0000 mg | ORAL_TABLET | Freq: Every day | ORAL | Status: DC
  Administered 2017-08-21 – 2017-08-29 (×8): 20 mg via ORAL
  Filled 2017-08-21 (×9): qty 1

## 2017-08-21 MED ORDER — MAGNESIUM CITRATE PO SOLN: 1.0000 | Freq: Once | ORAL | Status: DC | PRN

## 2017-08-21 MED ORDER — RAMIPRIL 10 MG PO TABS: 10.0000 mg | ORAL_TABLET | Freq: Two times a day (BID) | ORAL | Status: DC

## 2017-08-21 MED ORDER — OMEGA 3 1200 MG PO CAPS: 1200.0000 mg | ORAL_CAPSULE | Freq: Every day | ORAL | Status: DC

## 2017-08-21 MED ORDER — INSULIN ASPART 100 UNIT/ML ~~LOC~~ SOLN
0.0000 [IU] | Freq: Three times a day (TID) | SUBCUTANEOUS | Status: DC
  Administered 2017-08-21 – 2017-08-22 (×2): 1 [IU] via SUBCUTANEOUS
  Administered 2017-08-22: 2 [IU] via SUBCUTANEOUS
  Administered 2017-08-22: 1 [IU] via SUBCUTANEOUS
  Administered 2017-08-23 (×3): 3 [IU] via SUBCUTANEOUS
  Administered 2017-08-24: 2 [IU] via SUBCUTANEOUS
  Administered 2017-08-24 (×2): 3 [IU] via SUBCUTANEOUS
  Administered 2017-08-25 – 2017-08-27 (×5): 1 [IU] via SUBCUTANEOUS
  Administered 2017-08-28: 2 [IU] via SUBCUTANEOUS
  Administered 2017-08-29: 1 [IU] via SUBCUTANEOUS
  Administered 2017-08-29 (×2): 2 [IU] via SUBCUTANEOUS

## 2017-08-21 MED ORDER — ACETAMINOPHEN 325 MG PO TABS
650.0000 mg | ORAL_TABLET | Freq: Four times a day (QID) | ORAL | Status: DC | PRN
  Filled 2017-08-21 (×2): qty 2

## 2017-08-21 MED ORDER — SODIUM CHLORIDE 0.9 % IV BOLUS (SEPSIS)
500.0000 mL | Freq: Once | INTRAVENOUS | Status: AC
  Administered 2017-08-21: 500 mL via INTRAVENOUS

## 2017-08-21 MED ORDER — AMLODIPINE BESYLATE 10 MG PO TABS
10.0000 mg | ORAL_TABLET | Freq: Every day | ORAL | Status: DC
  Administered 2017-08-21: 10 mg via ORAL
  Filled 2017-08-21 (×3): qty 1

## 2017-08-21 MED ORDER — DEXTROSE-NACL 5-0.45 % IV SOLN: INTRAVENOUS | Status: DC

## 2017-08-21 MED ORDER — FUROSEMIDE 10 MG/ML IJ SOLN
40.0000 mg | Freq: Once | INTRAMUSCULAR | Status: AC
  Administered 2017-08-21: 40 mg via INTRAVENOUS
  Filled 2017-08-21: qty 4

## 2017-08-21 MED ORDER — HYDROCHLOROTHIAZIDE 12.5 MG PO CAPS: 12.5000 mg | ORAL_CAPSULE | Freq: Every day | ORAL | Status: DC

## 2017-08-21 MED ORDER — CALCITONIN (SALMON) 200 UNIT/ACT NA SOLN
4.0000 | Freq: Three times a day (TID) | NASAL | Status: AC
  Administered 2017-08-21 – 2017-08-22 (×3): 4 via NASAL
  Filled 2017-08-21: qty 3.7

## 2017-08-21 MED ORDER — VITAMIN D3 25 MCG (1000 UNIT) PO TABS: 1000.0000 [IU] | ORAL_TABLET | Freq: Every day | ORAL | Status: DC

## 2017-08-21 MED ORDER — FLUTICASONE PROPIONATE 50 MCG/ACT NA SUSP
1.0000 | Freq: Every day | NASAL | Status: DC | PRN
  Filled 2017-08-21: qty 16

## 2017-08-21 MED ORDER — INSULIN ASPART 100 UNIT/ML ~~LOC~~ SOLN
0.0000 [IU] | Freq: Every day | SUBCUTANEOUS | Status: DC
  Administered 2017-08-21: 3 [IU] via SUBCUTANEOUS
  Administered 2017-08-23 – 2017-08-29 (×2): 2 [IU] via SUBCUTANEOUS

## 2017-08-21 MED ORDER — ONDANSETRON HCL 4 MG/2ML IJ SOLN
4.0000 mg | Freq: Four times a day (QID) | INTRAMUSCULAR | Status: DC | PRN
  Administered 2017-08-29: 4 mg via INTRAVENOUS
  Filled 2017-08-21: qty 2

## 2017-08-21 MED ORDER — ACETAMINOPHEN 650 MG RE SUPP
650.0000 mg | Freq: Four times a day (QID) | RECTAL | Status: DC | PRN
  Administered 2017-08-23 – 2017-08-24 (×2): 650 mg via RECTAL
  Filled 2017-08-21 (×2): qty 1

## 2017-08-21 MED ORDER — OMEGA-3-ACID ETHYL ESTERS 1 G PO CAPS
1.0000 g | ORAL_CAPSULE | Freq: Every day | ORAL | Status: DC
  Administered 2017-08-21 – 2017-08-29 (×8): 1 g via ORAL
  Filled 2017-08-21 (×10): qty 1

## 2017-08-21 MED ORDER — HYDRALAZINE HCL 20 MG/ML IJ SOLN: 10.0000 mg | Freq: Four times a day (QID) | INTRAMUSCULAR | Status: DC | PRN

## 2017-08-21 MED ORDER — ENOXAPARIN SODIUM 40 MG/0.4ML ~~LOC~~ SOLN
40.0000 mg | SUBCUTANEOUS | Status: DC
  Administered 2017-08-21: 40 mg via SUBCUTANEOUS
  Filled 2017-08-21: qty 0.4

## 2017-08-21 MED ORDER — ASPIRIN EC 81 MG PO TBEC
81.0000 mg | DELAYED_RELEASE_TABLET | Freq: Every day | ORAL | Status: DC
  Administered 2017-08-21 – 2017-08-22 (×2): 81 mg via ORAL
  Filled 2017-08-21 (×3): qty 1

## 2017-08-21 MED ORDER — CARVEDILOL 25 MG PO TABS
25.0000 mg | ORAL_TABLET | Freq: Two times a day (BID) | ORAL | Status: DC
  Administered 2017-08-21 – 2017-08-22 (×2): 25 mg via ORAL
  Filled 2017-08-21 (×4): qty 1

## 2017-08-21 MED ORDER — DEXTROSE-NACL 5-0.45 % IV SOLN
INTRAVENOUS | Status: AC
  Administered 2017-08-21 (×2): via INTRAVENOUS
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
## 2017-08-21 NOTE — H&P (Addendum)
TRH H&P   Patient Demographics:    Kimberly Castaneda, is a 76 y.o. female  MRN: [id number]   DOB - Jan 02, 1941  Admit Date - 08/23/2017  Outpatient Primary MD for the patient is [name], MD  Outpatient Specialists: Dr Sallyanne Kuster    Patient coming from: Home  No chief complaint on file.     HPI:    Kimberly Castaneda  is a 76 y.o. 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 She stayed on the floor throughout the night and this morning husband called 911 and she was brought to the ER, husband says that she has had similar episodes at least 7 or 8 times in the ER. She is currently feeling weak all over but denies any other subjective complaints, never had any palpitations or chest pain, no focal weakness, no headache. Besides generalized weakness her review of systems is negative.  Further review of her blood work shows that she has hypercalcemia, blood work reveals leukocytosis, EKG is nonacute, CT head and C-spine are nonacute, UA unremarkable.    Review of systems:    In addition to the HPI above,  No Fever-chills, No Headache, No changes with Vision or hearing, No problems swallowing food or Liquids, No Chest pain, Cough or Shortness of Breath, No Abdominal pain, No Nausea or  Vommitting, Bowel movements are regular, No Blood in stool or Urine, No dysuria, No new skin rashes or bruises, No new joints pains-aches,  No new weakness, tingling, numbness in any extremity, positive generalized weakness No recent weight gain or loss, No polyuria, polydypsia or polyphagia, No significant Mental Stressors.  A full 10 point Review of Systems was done, except as stated above, all other Review of Systems were negative.   With Past History of the following :    Past Medical History:  Diagnosis Date  . CKD (chronic kidney disease) stage 3, GFR 30-59 ml/min (HCC)   . Diabetes mellitus ([place])  type 2  . Hypercholesterolemia   . Mitral regurgitation   . Nonischemic cardiomyopathy ([place])   . Systemic hypertension   . Vertigo       Past Surgical History:  Procedure Laterality Date  . CARDIAC CATHETERIZATION  07/18/2010   r/t nonsichemic cardiomyopathy; normal coronary anatomy; EF 20%;   . TONSILLECTOMY    . TUBAL LIGATION    . US ECHOCARDIOGRAPHY  04/03/2012   EF 55%; mild LVH      Social History:     Social History  Substance Use Topics  . Smoking status: Never Smoker  . Smokeless tobacco: Never Used  . Alcohol use No         Family History :   Cancer in multiple family members, does have history of breast cancer and some female relatives but not sure   Home Medications:   Prior to Admission medications   Medication Sig Start Date End Date Taking? Authorizing Provider  alendronate (FOSAMAX) 70 MG tablet Take 70 mg by mouth once a week.  04/02/13  Yes [provider]  Ascorbic Acid (VITA-C PO) Take 1 tablet by mouth daily.   Yes [provider]  aspirin 81 MG tablet Take 81 mg by mouth daily.   Yes [provider]  atorvastatin (LIPITOR) 20 MG tablet Take 20 mg by mouth daily.   Yes [provider]  carvedilol (COREG) 12.5 MG tablet Take 1 tablet (12.5 mg total) by mouth 2 (two) times daily. Patient taking  differently: Take 25 mg by mouth 2 (two) times daily.  07/05/17  Yes Kilroy, Lurena Joiner K, PA-C  cholecalciferol (VITAMIN D) 1000 UNITS tablet Take 1,000 Units by mouth daily.   Yes [provider]  fluticasone (FLONASE) 50 MCG/ACT nasal spray Place 1-2 sprays into both nostrils daily as needed for allergies.  02/17/13  Yes [provider]  hydrochlorothiazide (MICROZIDE) 12.5 MG capsule Take 12.5 mg by mouth daily.   Yes [provider]  linagliptin (TRADJENTA) 5 MG TABS tablet Take 5 mg by mouth daily.   Yes [provider]  OMEGA 3 1200 MG CAPS Take 1,200 mg by mouth daily.   Yes [provider]  ramipril (ALTACE) 10 MG tablet Take 10 mg by mouth 2 (two) times daily.   Yes [provider]     Allergies:     Allergies  Allergen Reactions  . Erythromycin Nausea And Vomiting  . Quinapril Other (See Comments)    unknown  . Ciprofloxacin Hcl Nausea Only     Physical Exam:   Vitals  Blood pressure (!) 163/91, pulse 98, temperature 97.7 F (36.5 C), temperature source Oral, resp. rate (!) 22, SpO2 97 %.   1. General moderately built elderly white female in poor hygiene who looks tired and fatigued lying in bed in NAD,     2. Normal affect and insight, Not Suicidal or Homicidal, Awake Alert, Oriented X 3.  3. No F.N deficits, ALL C.Nerves Intact, Strength 5/5 all 4 extremities, Sensation intact all 4 extremities, Plantars down going.  4. Ears and Eyes appear Normal, Conjunctivae clear, PERRLA. Moist Oral Mucosa.  5. Supple Neck, No JVD, No cervical lymphadenopathy appriciated, No Carotid Bruits.  6. Symmetrical Chest wall movement, Good air movement bilaterally, CTAB.  7. RRR, No Gallops, Rubs or Murmurs, No Parasternal Heave.  8. Positive Bowel Sounds, Abdomen Soft, No tenderness, No organomegaly appriciated,No rebound -guarding or rigidity.  9.  No Cyanosis, Normal Skin Turgor, No Skin Rash or Bruise.  Left leg slightly more  swollen than right on exam.  10. Good muscle tone,  joints appear normal , no effusions, Normal ROM.  11. No Palpable Lymph Nodes in Neck or Axillae      Data Review:    CBC  Recent Labs Lab 08/13/2017 1038  WBC 31.9*  HGB 9.6*  HCT 29.8*  PLT 619*  MCV 87.6  MCH 28.2  MCHC 32.2  RDW 14.8  LYMPHSABS 1.6  MONOABS 1.3*  EOSABS 0.0  BASOSABS 0.0   ------------------------------------------------------------------------------------------------------------------  Chemistries   Recent Labs Lab 09/03/2017 1038  NA 138  K 4.9  CL 102  CO2 22  GLUCOSE 169*  BUN 31*  CREATININE 1.17*  CALCIUM 12.8*  AST 17  ALT 11*  ALKPHOS 135*  BILITOT 0.7   ------------------------------------------------------------------------------------------------------------------ CrCl cannot be calculated (Unknown ideal weight.). ------------------------------------------------------------------------------------------------------------------ No results for input(s): TSH, T4TOTAL, T3FREE, THYROIDAB in the last 72 hours.  Invalid input(s): FREET3  Coagulation profile No results for input(s): INR, PROTIME in the last 168 hours. ------------------------------------------------------------------------------------------------------------------- No results for input(s): DDIMER in the last 72 hours. -------------------------------------------------------------------------------------------------------------------  Cardiac Enzymes  Recent Labs Lab 08/29/2017 1038  TROPONINI <0.03   ------------------------------------------------------------------------------------------------------------------ No results found for: BNP   ---------------------------------------------------------------------------------------------------------------  Urinalysis    Component Value Date/Time   COLORURINE YELLOW 09/03/2017 [place] 08/15/2017 1544   LABSPEC 1.017 09/03/2017 1544   PHURINE  5.0 08/08/2017 1544   GLUCOSEU NEGATIVE 08/09/2017 1544   HGBUR NEGATIVE 08/18/2017 [place] 08/29/2017 1544   KETONESUR 5 (A) 08/15/2017 1544   PROTEINUR NEGATIVE 08/13/2017 1544   UROBILINOGEN 1.0 11/21/2010 2342   NITRITE NEGATIVE 08/10/2017 1544   LEUKOCYTESUR NEGATIVE 08/23/2017 1544    ----------------------------------------------------------------------------------------------------------------   Imaging Results:    Dg Chest 2 View  Result Date: 09/05/2017 CLINICAL DATA:  Syncopal episode yesterday with fall, initial encounter EXAM: CHEST  2 VIEW COMPARISON:  07/05/2017 FINDINGS: Cardiac shadow is within normal limits. The lungs are well aerated bilaterally. No focal infiltrate or sizable effusion is seen. No acute bony abnormality is noted. Degenerative changes of the thoracic spine are seen. IMPRESSION: No active cardiopulmonary disease. Electronically Signed   By: Inez Catalina M.D.   On: 08/15/2017 10:45   Ct Head Wo Contrast  Result Date: 09/02/2017 CLINICAL DATA:  Fall. EXAM: CT HEAD WITHOUT CONTRAST CT CERVICAL SPINE WITHOUT CONTRAST TECHNIQUE: Multidetector CT imaging of the head and cervical spine was performed following the standard protocol without intravenous contrast. Multiplanar CT image reconstructions of the cervical spine were also generated. COMPARISON:  MRI brain dated July 20, 2010. FINDINGS: CT HEAD FINDINGS Brain: No evidence of acute infarction, hemorrhage, hydrocephalus, extra-axial collection or mass lesion/mass effect. 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 Mild age-related cerebral atrophy with compensatory dilatation of the ventricles, unchanged. Vascular: Intracranial atherosclerotic vascular calcifications. No hyperdense vessel. Skull: Normal. Negative for fracture or focal lesion. Sinuses/Orbits: The bilateral paranasal sinuses and mastoid air cells are clear. The orbits are unremarkable. Other: None. CT CERVICAL SPINE FINDINGS  Alignment: Normal. Skull base and vertebrae: No acute fracture. 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 No primary bone lesion or focal pathologic process. Soft tissues and spinal canal: No prevertebral fluid or swelling. No visible canal hematoma. Disc levels: Prominent anterior endplate flowing osteophytes, consistent with diffuse idiopathic skeletal hyperostosis. Disc spaces are relatively preserved. Upper chest: Negative. Other: None. IMPRESSION: 1. No acute intracranial abnormality. 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 2.  No acute cervical  spine fracture.  Changes consistent with DISH. Electronically Signed   By: Titus Dubin M.D.   On: 08/06/2017 10:54   Ct Cervical Spine Wo Contrast  Result Date: 08/24/2017 CLINICAL DATA:  Fall. EXAM: CT HEAD WITHOUT CONTRAST CT CERVICAL SPINE WITHOUT CONTRAST TECHNIQUE: Multidetector CT imaging of the head and cervical spine was performed following the standard protocol without intravenous contrast. Multiplanar CT image reconstructions of the cervical spine were also generated. COMPARISON:  MRI brain dated July 20, 2010. FINDINGS: CT HEAD FINDINGS Brain: No evidence of acute infarction, hemorrhage, hydrocephalus, extra-axial collection or mass lesion/mass effect. 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 Mild age-related cerebral atrophy with compensatory dilatation of the ventricles, unchanged. Vascular: Intracranial atherosclerotic vascular calcifications. No hyperdense vessel. Skull: Normal. Negative for fracture or focal lesion. Sinuses/Orbits: The bilateral paranasal sinuses and mastoid air cells are clear. The orbits are unremarkable. Other: None. CT CERVICAL SPINE FINDINGS Alignment: Normal. Skull base and vertebrae: No acute fracture. 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 No primary bone lesion or focal pathologic process. Soft tissues and spinal canal: No  prevertebral fluid or swelling. No visible canal hematoma. Disc levels: Prominent anterior endplate flowing osteophytes, consistent with diffuse idiopathic skeletal hyperostosis. Disc spaces are relatively preserved. Upper chest: Negative. Other: None. IMPRESSION: 1. No acute intracranial abnormality. 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 2.  No acute cervical spine fracture.  Changes consistent with DISH. Electronically Signed   By: Titus Dubin M.D.   On: 08/11/2017 10:54    My personal review of EKG: Rhythm NSR/ junctional,  no Acute ST changes   Assessment & Plan:     1. Near syncope. This appears most likely due to dehydration caused by hypercalcemia which could be due to hyperpathyroidism or due to some lymphoproliferative disorder. At this time she will be kept and 23 our observation, telemetry, aggressively hydrate, 1 dose of Lasix after a few hours of hydration, I am me a calcium, check PTH, PTHrp, TSH, ACE levels and LDH, monitor calcium levels again tomorrow. Initiate PT from tomorrow. Check orthostatics as well.  2.leukocytosis. This appears to be somewhat chronic. Likely worse due to severe dehydration with some thrombocytosis, have checked LDH, requested Dr. Blair [facility] to evaluate the patient tomorrow first thing. We'll check peripheral smear.  3. Hypertension Continue home dose Coreg, since she has dehydrated Will hold HCTZ and ACE inhibitor and place her on Norvasc for now, as needed IV hydralazine.  4. Nonischemic cardiomyopathy with chronic diastolic dysfunction. Last EF 50%. Currently no acute issues, continue beta blocker, will repeat echocardiogram due to #1 above.Continue home dose aspirin.  5. DM type II. Check A1c place on sliding scale continue Trajenta.  6. Dyslipidemia. Continue home dose statin.  7. CKD stage II. Close to baseline.  8. L.Leg swelling - check [place].   DVT Prophylaxis Lovenox    AM Labs Ordered, also please review Full Orders  Family  Communication: Admission, patients condition and plan of care including tests being ordered have been discussed with the patient and husband who indicate understanding and agree with the plan and Code Status.  Code Status Full  Likely DC to  TBD  Condition GUARDED    Consults called: Oncology Dr Benay Spice   Admission status: Obs   Time spent in minutes : 35   Lala Lund M.D on 08/09/2017 at 4:52 PM  Between 7am to 7pm - Pager - [phone] ( page via [facility], text pages only, please mention full 10 digit call  back number).  After 7pm go to [email or web address] - password [facility]  [facility] - Office  [phone]

## 2017-08-21 NOTE — ED Notes (Signed)
Patient transported to CT 

## 2017-08-21 NOTE — ED Notes (Signed)
Pt is aware that a urine sample is needed but states she does not feel like she has to go right now.

## 2017-08-21 NOTE — ED Provider Notes (Signed)
[facility] DEPT Provider Note   CSN: [id number] Arrival date & time: 08/17/2017  8786     History   Chief Complaint No chief complaint on file.   HPI Kimberly Castaneda is a 76 y.o. female.  Patient is a 76 year old female with a history of diabetes, hypertension, hyperlipidemia, CHF and chronic kidney disease who presents after a fall. She states that yesterday evening she was walking back from the bathroom at her house and had a syncopal episode. She says at that time she felt dizzy and lightheaded but currently denies any ongoing dizziness. She fell backward onto the ground. She denies any injuries from the fall. She at that time according the husband would not allow EMS to be called. Her husband was unable to get her back into the bed and she laid on the floor all night. This morning, she allowed her husband call EMS. She states she feels a little bit weak but denies any recent illnesses. She does say that she had some vomiting yesterday but no diarrhea. No abdominal pain. No chest pain or shortness of breath. She states that she's had similar syncopal episodes in the past, usually when walking back from the bathroom to her bed. She denies any recent fevers. She does report a little bit of a cough. No urinary symptoms. She states she is able to move all extremities without any weakness. No speech deficits. No vision changes. 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      Past Medical History:  Diagnosis Date  . CKD (chronic kidney disease) stage 3, GFR 30-59 ml/min (HCC)   . Diabetes mellitus ([place])    type 2  . Hypercholesterolemia   . Mitral regurgitation   . Nonischemic cardiomyopathy ([place])   . Systemic hypertension   . Vertigo     Patient Active Problem List   Diagnosis Date Noted  . Normal coronary arteries 07/05/2017  . Weakness 07/05/2017  . Orthostatic hypotension  07/05/2017  . Chronic diastolic CHF (congestive heart failure), NYHA class 1 ([place]) 04/16/2013  . Nonischemic cardiomyopathy ([place])   . Systemic hypertension   . CKD (chronic kidney disease) stage 3, GFR 30-59 ml/min (HCC)   . Non-insulin dependent type 2 diabetes mellitus ([place])   . Dyslipidemia     Past Surgical History:  Procedure Laterality Date  . CARDIAC CATHETERIZATION  07/18/2010   r/t nonsichemic cardiomyopathy; normal coronary anatomy; EF 20%;   . TONSILLECTOMY    . TUBAL LIGATION    . US ECHOCARDIOGRAPHY  04/03/2012   EF 55%; mild LVH    OB History    No data available       Home Medications    Prior to Admission medications   Medication Sig Start Date End Date Taking? Authorizing Provider  alendronate (FOSAMAX) 70 MG tablet Take 70 mg by mouth once a week.  04/02/13  Yes [provider]  Ascorbic Acid (VITA-C PO) Take 1 tablet by mouth daily.   Yes [provider]  aspirin 81 MG tablet Take 81 mg by mouth daily.   Yes [provider]  atorvastatin (LIPITOR) 20 MG tablet Take 20 mg by mouth daily.   Yes [provider]  carvedilol (COREG) 12.5 MG tablet Take 1 tablet (12.5 mg total) by mouth 2 (two) times daily. Patient taking differently: Take 25 mg by mouth 2 (two) times daily.  07/05/17  Yes  Erlene Quan, PA-C  cholecalciferol (VITAMIN D) 1000 UNITS tablet Take 1,000 Units by mouth daily.   Yes [provider]  fluticasone (FLONASE) 50 MCG/ACT nasal spray Place 1-2 sprays into both nostrils daily as needed for allergies.  02/17/13  Yes [provider]  hydrochlorothiazide (MICROZIDE) 12.5 MG capsule Take 12.5 mg by mouth daily.   Yes [provider]  linagliptin (TRADJENTA) 5 MG TABS tablet Take 5 mg by mouth daily.   Yes [provider]  OMEGA 3 1200 MG CAPS Take 1,200 mg by mouth daily.   Yes [provider]  ramipril (ALTACE) 10 MG tablet Take 10 mg by mouth 2 (two) times daily.   Yes  [provider]    Family History History reviewed. No pertinent family history.  Social History Social History  Substance Use Topics  . Smoking status: Never Smoker  . Smokeless tobacco: Never Used  . Alcohol use No     Allergies   Erythromycin; Quinapril; and Ciprofloxacin hcl   Review of Systems Review of Systems  Constitutional: Positive for fatigue. Negative for chills, diaphoresis and fever.  HENT: Negative for congestion, rhinorrhea and sneezing.   Eyes: Negative.   Respiratory: Positive for cough. Negative for chest tightness and shortness of breath.   Cardiovascular: Negative for chest pain and leg swelling.  Gastrointestinal: Positive for nausea and vomiting. Negative for abdominal pain, blood in stool and diarrhea.  Genitourinary: Negative for difficulty urinating, flank pain, frequency and hematuria.  Musculoskeletal: Negative for arthralgias and back pain.  Skin: Negative for rash.  Neurological: Positive for syncope. Negative for dizziness, speech difficulty, weakness, numbness and headaches.     Physical Exam Updated Vital Signs BP (!) 163/91 (BP Location: Right Arm)   Pulse 98   Temp 97.7 F (36.5 C) (Oral)   Resp (!) 22   SpO2 97%   Physical Exam  Constitutional: She is oriented to person, place, and time. She appears well-developed and well-nourished.  disheveled  HENT:  Head: Normocephalic and atraumatic.  Eyes: Pupils are equal, round, and reactive to light.  Neck: Normal range of motion. Neck supple.  No pain along the cervical thoracic or lumbosacral spine  Cardiovascular: Normal rate, regular rhythm and normal heart sounds.   Pulmonary/Chest: Effort normal and breath sounds normal. No respiratory distress. She has no wheezes. She has no rales. She exhibits no tenderness.  Abdominal: Soft. Bowel sounds are normal. There is no tenderness. There is no rebound and no guarding.  Musculoskeletal: Normal range of motion. She exhibits no  edema.  No pain on palpation or range of motion extremities. She has a large soft tissue mass to her left upper arm  Lymphadenopathy:    She has no cervical adenopathy.  Neurological: She is alert and oriented to person, place, and time.  Motor 5/5 all extremities Sensation grossly intact to LT all extremities no pronator drift CN II-XII grossly intact    Skin: Skin is warm and dry. No rash noted.  Psychiatric: She has a normal mood and affect.     ED Treatments / Results  Labs (all labs ordered are listed, but only abnormal results are displayed) Labs Reviewed  COMPREHENSIVE METABOLIC PANEL - Abnormal; Notable for the following:       Result Value   Glucose, Bld 169 (*)    BUN 31 (*)    Creatinine, Ser 1.17 (*)    Calcium 12.8 (*)    Albumin 2.8 (*)    ALT 11 (*)  Alkaline Phosphatase 135 (*)    GFR calc non Af Amer 44 (*)    GFR calc Af Amer 51 (*)    All other components within normal limits  CBC WITH DIFFERENTIAL/PLATELET - Abnormal; Notable for the following:    WBC 31.9 (*)    RBC 3.40 (*)    Hemoglobin 9.6 (*)    HCT 29.8 (*)    Platelets 619 (*)    Neutro Abs 29.0 (*)    Monocytes Absolute 1.3 (*)    All other components within normal limits  URINALYSIS, ROUTINE W REFLEX MICROSCOPIC - Abnormal; Notable for the following:    Ketones, ur 5 (*)    All other components within normal limits  TROPONIN I  CK  OCCULT BLOOD X 1 CARD TO LAB, STOOL  LACTATE DEHYDROGENASE    EKG  EKG Interpretation  Date/Time:  Tuesday August 21 2017 11:15:36 EDT Ventricular Rate:  87 PR Interval:    QRS Duration: 87 QT Interval:  302 QTC Calculation: 364 R Axis:   40 Text Interpretation:  Sinus rhythm Probable anterior infarct, age indeterminate since last tracing no significant change Confirmed by Malvin Johns [phone]) on 09/05/2017 12:03:56 PM       Radiology Dg Chest 2 View  Result Date: 08/28/2017 CLINICAL DATA:  Syncopal episode yesterday with fall, initial  encounter EXAM: CHEST  2 VIEW COMPARISON:  07/05/2017 FINDINGS: Cardiac shadow is within normal limits. The lungs are well aerated bilaterally. No focal infiltrate or sizable effusion is seen. No acute bony abnormality is noted. Degenerative changes of the thoracic spine are seen. IMPRESSION: No active cardiopulmonary disease. Electronically Signed   By: Inez Catalina M.D.   On: 08/20/2017 10:45   Ct Head Wo Contrast  Result Date: 08/20/2017 CLINICAL DATA:  Fall. EXAM: CT HEAD WITHOUT CONTRAST CT CERVICAL SPINE WITHOUT CONTRAST TECHNIQUE: Multidetector CT imaging of the head and cervical spine was performed following the standard protocol without intravenous contrast. Multiplanar CT image reconstructions of the cervical spine were also generated. COMPARISON:  MRI brain dated July 20, 2010. FINDINGS: CT HEAD FINDINGS Brain: No evidence of acute infarction, hemorrhage, hydrocephalus, extra-axial collection or mass lesion/mass effect. 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 Mild age-related cerebral atrophy with compensatory dilatation of the ventricles, unchanged. Vascular: Intracranial atherosclerotic vascular calcifications. No hyperdense vessel. Skull: Normal. Negative for fracture or focal lesion. Sinuses/Orbits: The bilateral paranasal sinuses and mastoid air cells are clear. The orbits are unremarkable. Other: None. CT CERVICAL SPINE FINDINGS Alignment: Normal. Skull base and vertebrae: No acute fracture. 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 No primary bone lesion or focal pathologic process. Soft tissues and spinal canal: No prevertebral fluid or swelling. No visible canal hematoma. Disc levels: Prominent anterior endplate flowing osteophytes, consistent with diffuse idiopathic skeletal hyperostosis. Disc spaces are relatively preserved. Upper chest: Negative. Other: None. IMPRESSION: 1. No acute intracranial abnormality. 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  2.  No acute cervical spine fracture.  Changes consistent with DISH. Electronically Signed   By: Titus Dubin M.D.   On: 08/08/2017 10:54   Ct Cervical Spine Wo Contrast  Result Date: 08/16/2017 CLINICAL DATA:  Fall. EXAM: CT HEAD WITHOUT CONTRAST CT CERVICAL SPINE WITHOUT CONTRAST TECHNIQUE: Multidetector CT imaging of the head and cervical spine was performed following the standard protocol without intravenous contrast. Multiplanar CT image reconstructions of the cervical spine were also generated. COMPARISON:  MRI brain dated July 20, 2010. FINDINGS: CT HEAD FINDINGS Brain: No evidence of  acute infarction, hemorrhage, hydrocephalus, extra-axial collection or mass lesion/mass effect. 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 Mild age-related cerebral atrophy with compensatory dilatation of the ventricles, unchanged. Vascular: Intracranial atherosclerotic vascular calcifications. No hyperdense vessel. Skull: Normal. Negative for fracture or focal lesion. Sinuses/Orbits: The bilateral paranasal sinuses and mastoid air cells are clear. The orbits are unremarkable. Other: None. CT CERVICAL SPINE FINDINGS Alignment: Normal. Skull base and vertebrae: No acute fracture. 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 No primary bone lesion or focal pathologic process. Soft tissues and spinal canal: No prevertebral fluid or swelling. No visible canal hematoma. Disc levels: Prominent anterior endplate flowing osteophytes, consistent with diffuse idiopathic skeletal hyperostosis. Disc spaces are relatively preserved. Upper chest: Negative. Other: None. IMPRESSION: 1. No acute intracranial abnormality. 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 2.  No acute cervical spine fracture.  Changes consistent with DISH. Electronically Signed   By: Titus Dubin M.D.   On: 08/09/2017 10:54    Procedures Procedures (including critical care time)  Medications Ordered in ED Medications    sodium chloride 0.9 % bolus 500 mL (0 mLs Intravenous Stopped 09/01/2017 1515)     Initial Impression / Assessment and Plan / ED Course  I have reviewed the triage vital signs and the nursing notes.  Pertinent labs & imaging results that were available during my care of the patient were reviewed by me and considered in my medical decision making (see chart for details).     Patient is a 75 year old female who presents with generalized weakness and falls and a syncopal episode last night. She laid on the floor for over 12 hours last night. She however has no evidence of rhabdomyolysis.  She doesn't have any focal neurologic deficits. Her weakness seems to be generalized. Her husband once he arrived stated that she's had multiple falls recently due to generalized weakness. She's not able to ambulate without assistance. She has a decreased appetite. I am concerned that her WBC count is still elevated. She reportedly was advised to follow-up with the cancer center regarding previously elevated CBC counts but she has not yet done that. I don't see any signs of infection. There is no evidence of pneumonia. No evidence of urinary tract infection. No areas of cellulitis. She is unable to stand unassisted or ambulate. Given this I feel that she would benefit from hospitalization and further evaluation of the WBC count.  Her hemoglobin is slightly lower than baseline. She hasn't reported any blood in her stool but I will go ahead and check a Hemoccult.  I spoke with the hospitalist who will admit the pt.  Final Clinical Impressions(s) / ED Diagnoses   Final diagnoses:  Syncope, unspecified syncope type  Leukocytosis, unspecified type  Weakness    New Prescriptions New Prescriptions   No medications on file     Malvin Johns, MD 08/30/2017 1626

## 2017-08-21 NOTE — Progress Notes (Signed)
Call report to [place] at 5:45 pm

## 2017-08-21 NOTE — ED Notes (Signed)
Call report but nurse in pt room at this time. Will call me back.

## 2017-08-21 NOTE — ED Notes (Signed)
Pt is aware that a urine sample is needed but is unable to provide one at this time 

## 2017-08-21 NOTE — ED Notes (Signed)
Bed: WA03 Expected date:  Expected time:  Means of arrival:  Comments: EMS_fall 

## 2017-08-22 ENCOUNTER — Observation Stay (HOSPITAL_COMMUNITY): Payer: Medicare Other

## 2017-08-22 ENCOUNTER — Observation Stay (HOSPITAL_BASED_OUTPATIENT_CLINIC_OR_DEPARTMENT_OTHER): Payer: Medicare Other

## 2017-08-22 DIAGNOSIS — A419 Sepsis, unspecified organism: Secondary | ICD-10-CM | POA: Diagnosis not present

## 2017-08-22 DIAGNOSIS — D72829 Elevated white blood cell count, unspecified: Secondary | ICD-10-CM | POA: Diagnosis not present

## 2017-08-22 DIAGNOSIS — N888 Other specified noninflammatory disorders of cervix uteri: Secondary | ICD-10-CM | POA: Diagnosis not present

## 2017-08-22 DIAGNOSIS — Z7982 Long term (current) use of aspirin: Secondary | ICD-10-CM | POA: Diagnosis not present

## 2017-08-22 DIAGNOSIS — I82442 Acute embolism and thrombosis of left tibial vein: Secondary | ICD-10-CM | POA: Diagnosis present

## 2017-08-22 DIAGNOSIS — R0602 Shortness of breath: Secondary | ICD-10-CM

## 2017-08-22 DIAGNOSIS — D649 Anemia, unspecified: Secondary | ICD-10-CM

## 2017-08-22 DIAGNOSIS — R531 Weakness: Secondary | ICD-10-CM | POA: Diagnosis present

## 2017-08-22 DIAGNOSIS — E872 Acidosis: Secondary | ICD-10-CM | POA: Diagnosis present

## 2017-08-22 DIAGNOSIS — Z515 Encounter for palliative care: Secondary | ICD-10-CM | POA: Diagnosis not present

## 2017-08-22 DIAGNOSIS — E785 Hyperlipidemia, unspecified: Secondary | ICD-10-CM | POA: Diagnosis present

## 2017-08-22 DIAGNOSIS — I82432 Acute embolism and thrombosis of left popliteal vein: Secondary | ICD-10-CM | POA: Diagnosis present

## 2017-08-22 DIAGNOSIS — C539 Malignant neoplasm of cervix uteri, unspecified: Secondary | ICD-10-CM | POA: Diagnosis present

## 2017-08-22 DIAGNOSIS — R6521 Severe sepsis with septic shock: Secondary | ICD-10-CM | POA: Diagnosis not present

## 2017-08-22 DIAGNOSIS — N189 Chronic kidney disease, unspecified: Secondary | ICD-10-CM | POA: Diagnosis not present

## 2017-08-22 DIAGNOSIS — I5032 Chronic diastolic (congestive) heart failure: Secondary | ICD-10-CM | POA: Diagnosis present

## 2017-08-22 DIAGNOSIS — N859 Noninflammatory disorder of uterus, unspecified: Secondary | ICD-10-CM | POA: Diagnosis not present

## 2017-08-22 DIAGNOSIS — E119 Type 2 diabetes mellitus without complications: Secondary | ICD-10-CM | POA: Diagnosis not present

## 2017-08-22 DIAGNOSIS — I429 Cardiomyopathy, unspecified: Secondary | ICD-10-CM | POA: Diagnosis present

## 2017-08-22 DIAGNOSIS — I9589 Other hypotension: Secondary | ICD-10-CM | POA: Diagnosis not present

## 2017-08-22 DIAGNOSIS — E43 Unspecified severe protein-calorie malnutrition: Secondary | ICD-10-CM | POA: Diagnosis present

## 2017-08-22 DIAGNOSIS — I82412 Acute embolism and thrombosis of left femoral vein: Secondary | ICD-10-CM | POA: Diagnosis present

## 2017-08-22 DIAGNOSIS — N183 Chronic kidney disease, stage 3 (moderate): Secondary | ICD-10-CM | POA: Diagnosis not present

## 2017-08-22 DIAGNOSIS — K2211 Ulcer of esophagus with bleeding: Secondary | ICD-10-CM | POA: Diagnosis not present

## 2017-08-22 DIAGNOSIS — Z66 Do not resuscitate: Secondary | ICD-10-CM | POA: Diagnosis not present

## 2017-08-22 DIAGNOSIS — I82492 Acute embolism and thrombosis of other specified deep vein of left lower extremity: Secondary | ICD-10-CM | POA: Diagnosis not present

## 2017-08-22 DIAGNOSIS — E1122 Type 2 diabetes mellitus with diabetic chronic kidney disease: Secondary | ICD-10-CM | POA: Diagnosis present

## 2017-08-22 DIAGNOSIS — I5030 Unspecified diastolic (congestive) heart failure: Secondary | ICD-10-CM

## 2017-08-22 DIAGNOSIS — E860 Dehydration: Secondary | ICD-10-CM | POA: Diagnosis present

## 2017-08-22 DIAGNOSIS — N179 Acute kidney failure, unspecified: Secondary | ICD-10-CM | POA: Diagnosis present

## 2017-08-22 DIAGNOSIS — J690 Pneumonitis due to inhalation of food and vomit: Secondary | ICD-10-CM | POA: Diagnosis not present

## 2017-08-22 DIAGNOSIS — G9341 Metabolic encephalopathy: Secondary | ICD-10-CM | POA: Diagnosis not present

## 2017-08-22 DIAGNOSIS — D62 Acute posthemorrhagic anemia: Secondary | ICD-10-CM | POA: Diagnosis not present

## 2017-08-22 DIAGNOSIS — I824Z2 Acute embolism and thrombosis of unspecified deep veins of left distal lower extremity: Secondary | ICD-10-CM | POA: Diagnosis not present

## 2017-08-22 DIAGNOSIS — I82402 Acute embolism and thrombosis of unspecified deep veins of left lower extremity: Secondary | ICD-10-CM | POA: Diagnosis not present

## 2017-08-22 DIAGNOSIS — I130 Hypertensive heart and chronic kidney disease with heart failure and stage 1 through stage 4 chronic kidney disease, or unspecified chronic kidney disease: Secondary | ICD-10-CM | POA: Diagnosis present

## 2017-08-22 DIAGNOSIS — D638 Anemia in other chronic diseases classified elsewhere: Secondary | ICD-10-CM | POA: Diagnosis not present

## 2017-08-22 DIAGNOSIS — D72828 Other elevated white blood cell count: Secondary | ICD-10-CM | POA: Diagnosis not present

## 2017-08-22 DIAGNOSIS — Z79899 Other long term (current) drug therapy: Secondary | ICD-10-CM | POA: Diagnosis not present

## 2017-08-22 DIAGNOSIS — Z7951 Long term (current) use of inhaled steroids: Secondary | ICD-10-CM | POA: Diagnosis not present

## 2017-08-22 DIAGNOSIS — E7800 Pure hypercholesterolemia, unspecified: Secondary | ICD-10-CM | POA: Diagnosis present

## 2017-08-22 LAB — COMPREHENSIVE METABOLIC PANEL
ALBUMIN: 2.3 g/dL — AB (ref 3.5–5.0)
ALK PHOS: 108 U/L (ref 38–126)
ALT: 10 U/L — AB (ref 14–54)
AST: 13 U/L — ABNORMAL LOW (ref 15–41)
Anion gap: 11 (ref 5–15)
BILIRUBIN TOTAL: 0.5 mg/dL (ref 0.3–1.2)
BUN: 23 mg/dL — AB (ref 6–20)
CALCIUM: 10.9 mg/dL — AB (ref 8.9–10.3)
CO2: 22 mmol/L (ref 22–32)
CREATININE: 0.89 mg/dL (ref 0.44–1.00)
Chloride: 100 mmol/L — ABNORMAL LOW (ref 101–111)
GFR calc Af Amer: >60 mL/min (ref >60)
GFR calc non Af Amer: >60 mL/min (ref >60)
GLUCOSE: 160 mg/dL — AB (ref 65–99)
Potassium: 4.1 mmol/L (ref 3.5–5.1)
SODIUM: 133 mmol/L — AB (ref 135–145)
TOTAL PROTEIN: 5.9 g/dL — AB (ref 6.5–8.1)

## 2017-08-22 LAB — CBC WITH DIFFERENTIAL/PLATELET
BASOS ABS: 0 10*3/uL (ref 0.0–0.1)
BASOS PCT: 0 %
EOS ABS: 0 10*3/uL (ref 0.0–0.7)
Eosinophils Relative: 0 %
HEMATOCRIT: 24.5 % — AB (ref 36.0–46.0)
HEMOGLOBIN: 8 g/dL — AB (ref 12.0–15.0)
LYMPHS PCT: 6 %
Lymphs Abs: 1.6 10*3/uL (ref 0.7–4.0)
MCH: 28.6 pg (ref 26.0–34.0)
MCHC: 32.7 g/dL (ref 30.0–36.0)
MCV: 87.5 fL (ref 78.0–100.0)
MONOS PCT: 4 %
Monocytes Absolute: 1.1 10*3/uL — ABNORMAL HIGH (ref 0.1–1.0)
NEUTROS ABS: 24.2 10*3/uL — AB (ref 1.7–7.7)
NEUTROS PCT: 90 %
Platelets: 510 10*3/uL — ABNORMAL HIGH (ref 150–400)
RBC: 2.8 MIL/uL — ABNORMAL LOW (ref 3.87–5.11)
RDW: 14.9 % (ref 11.5–15.5)
WBC: 26.9 10*3/uL — ABNORMAL HIGH (ref 4.0–10.5)

## 2017-08-22 LAB — RETICULOCYTES
RBC.: 2.78 MIL/uL — AB (ref 3.87–5.11)
RETIC COUNT ABSOLUTE: 41.7 10*3/uL (ref 19.0–186.0)
Retic Ct Pct: 1.5 % (ref 0.4–3.1)

## 2017-08-22 LAB — ANGIOTENSIN CONVERTING ENZYME: ANGIOTENSIN-CONVERTING ENZYME: 20 U/L (ref 14–82)

## 2017-08-22 LAB — GLUCOSE, CAPILLARY
GLUCOSE-CAPILLARY: 127 mg/dL — AB (ref 65–99)
GLUCOSE-CAPILLARY: 167 mg/dL — AB (ref 65–99)
GLUCOSE-CAPILLARY: 128 mg/dL — AB (ref 65–99)
GLUCOSE-CAPILLARY: 158 mg/dL — AB (ref 65–99)

## 2017-08-22 LAB — ECHOCARDIOGRAM COMPLETE
Height: 62 in
Weight: 1936.52 oz

## 2017-08-22 LAB — PARATHYROID HORMONE, INTACT (NO CA): PTH: 19 pg/mL (ref 15–65)

## 2017-08-22 LAB — PATHOLOGIST SMEAR REVIEW

## 2017-08-22 MED ORDER — CARVEDILOL 12.5 MG PO TABS
12.5000 mg | ORAL_TABLET | Freq: Two times a day (BID) | ORAL | Status: DC
  Administered 2017-08-23 – 2017-08-25 (×2): 12.5 mg via ORAL
  Filled 2017-08-22 (×3): qty 1

## 2017-08-22 MED ORDER — ENOXAPARIN SODIUM 60 MG/0.6ML ~~LOC~~ SOLN
1.0000 mg/kg | Freq: Two times a day (BID) | SUBCUTANEOUS | Status: DC
  Administered 2017-08-22 – 2017-08-23 (×4): 55 mg via SUBCUTANEOUS
  Filled 2017-08-22 (×4): qty 0.6

## 2017-08-22 MED ORDER — SODIUM CHLORIDE 0.9 % IV BOLUS (SEPSIS)
500.0000 mL | Freq: Once | INTRAVENOUS | Status: AC
  Administered 2017-08-22: 500 mL via INTRAVENOUS

## 2017-08-22 MED ORDER — DEXTROSE-NACL 5-0.45 % IV SOLN
INTRAVENOUS | Status: AC
  Administered 2017-08-22 – 2017-08-23 (×2): via INTRAVENOUS

## 2017-08-22 NOTE — Progress Notes (Signed)
ANTICOAGULATION CONSULT NOTE - Initial Consult  Pharmacy Consult for Lovenox Indication: DVT  Allergies  Allergen Reactions  . Erythromycin Nausea And Vomiting  . Quinapril Other (See Comments)    unknown  . Ciprofloxacin Hcl Nausea Only    Patient Measurements: Height: 5\' 2"  (157.5 cm) Weight: 121 lb 0.5 oz (54.9 kg) IBW/kg (Calculated) : 50.1  Vital Signs: Temp: 97.7 F (36.5 C) (10/17 0611) Temp Source: Oral (10/17 0611) BP: 92/51 (10/17 0921) Pulse Rate: 81 (10/17 0812)  Labs:  Recent Labs  08/20/2017 1038 08/31/2017 1725 08/22/17 0422  HGB 9.6* 8.7* 8.0*  HCT 29.8* 26.8* 24.5*  PLT 619* 654* 510*  CREATININE 1.17* 0.96 0.89  CKTOTAL 52  --   --   TROPONINI <0.03  --   --     Estimated Creatinine Clearance: 43.2 mL/min (by C-G formula based on SCr of 0.89 mg/dL).   Medical History: Past Medical History:  Diagnosis Date  . CKD (chronic kidney disease) stage 3, GFR 30-59 ml/min (HCC)   . Diabetes mellitus ([place])    type 2  . Hypercholesterolemia   . Mitral regurgitation   . Nonischemic cardiomyopathy ([place])   . Systemic hypertension   . Vertigo     Medications:  Scheduled:  . amLODipine  10 mg Oral Daily  . aspirin EC  81 mg Oral Daily  . atorvastatin  20 mg Oral Daily  . calcitonin (salmon)  4 spray Alternating Nares TID  . carvedilol  25 mg Oral BID WC  . enoxaparin (LOVENOX) injection  1 mg/kg Subcutaneous Q12H  . insulin aspart  0-5 Units Subcutaneous QHS  . insulin aspart  0-9 Units Subcutaneous TID WC  . omega-3 acid ethyl esters  1 g Oral Daily   Infusions:   PRN: acetaminophen **OR** acetaminophen, fluticasone, hydrALAZINE, magnesium citrate, ondansetron (ZOFRAN) IV  Assessment: 76 year old female admitted with near syncope.  Doppler of LLE on 10/17 reveals an acute DVT.  Pharmacy is consulted to dose Lovenox.  Patient received a dose of 40mg  last night at 22:54.   SCr 0.89, CrCl 43 ml/min  Hgb 8.0, Plts 510  No anticoagulant use  prior to admission  No bleeding currently documented  Goal of Therapy:  Anti-Xa level 0.6-1 units/ml 4hrs after LMWH dose given Monitor platelets by anticoagulation protocol: Yes   Plan:  Lovenox 55mg  (1mg /kg) SQ q12h CBC at least q72h Monitor for signs/symptoms of bleeding or thrombosis  Peggyann Juba, PharmD, BCPS Pager: [phone] 08/22/2017,10:29 AM

## 2017-08-22 NOTE — Progress Notes (Signed)
VASCULAR LAB PRELIMINARY  PRELIMINARY  PRELIMINARY  PRELIMINARY   Left lower extremity venous duplex completed.    Preliminary report:  Left - Positive of an acute DVT noted in the posterior tibial vein coursing through the popliteal, the confluence of the gastrocnemius with the popliteal. And to the mid femoral veins. No evidence of a superficial thrombosis or Baker's cyst. There Is no propagation to the right.  Shonnie Poudrier, RVS 08/22/2017, 9:58 AM

## 2017-08-22 NOTE — Progress Notes (Signed)
PT Cancellation Note  Patient Details Name: Kimberly Castaneda MRN: [id number] DOB: [birth year]   Cancelled Treatment:    Reason Eval/Treat Not Completed: Medical issues which prohibited therapy . Has bedrest orders written 10/16. Will proceed  eval when bedrest  Lifted.   Claretha Cooper 08/22/2017, 7:55 AM Tresa Endo PT [phone]

## 2017-08-22 NOTE — Plan of Care (Signed)
Problem: Health Behavior/Discharge Planning: Goal: Ability to manage health-related needs will improve Outcome: Progressing May need SNF placement at d/c. Await PT input.   Problem: Physical Regulation: Goal: Ability to maintain clinical measurements within normal limits will improve Outcome: Progressing BP remains soft.   Problem: Activity: Goal: Risk for activity intolerance will decrease Outcome: Progressing Await PT consult.   Problem: Nutrition: Goal: Adequate nutrition will be maintained Outcome: Progressing Encourage PO intake. Appetite poor.

## 2017-08-22 NOTE — Progress Notes (Signed)
  Echocardiogram 2D Echocardiogram has been performed.  Kimberly Castaneda 08/22/2017, 2:23 PM

## 2017-08-22 NOTE — Progress Notes (Signed)
Unable to perform orthostatics due to patient falling and bedrest ordered. Will inform dayshift to re-assess.

## 2017-08-22 NOTE — Progress Notes (Addendum)
Attempted to obtain orthostatic VS with NT. Pt significantly orthostatic lying to sitting but wanted to attempt to stand. Pt able to stand w/ minimal assist with rolling walker and gait belt. Pt became near syncopal with dizziness/weakness/incontinence of urine after standing for ~10 seconds. No LOC but pt was unable to stand without support of 2 staff members and gait belt. Unable to obtain standing VS. Pt assisted back to lying in bed.  BP 117/54 HR 77 lying back in bed with resolution of dizziness. Dr [place] Pink aware. Order for bolus noted and administered.

## 2017-08-22 NOTE — Progress Notes (Signed)
   08/22/17 0239  What Happened  Was fall witnessed? Yes  Who witnessed fall? Katie R.  Patients activity before fall bathroom-assisted (Pt fell forward off BSC)  Point of contact head  Was patient injured? No (pt not c/o pain, no visible injury)  Follow Up  MD notified K. Schorr  Time MD notified [phone]  Family notified No- patient refusal  Additional tests Yes-comment (CT head and neck )  Progress note created (see row info) Yes  Adult Fall Risk Assessment  Risk Factor Category (scoring not indicated) History of more than one fall within 6 months before admission (document High fall risk);Fall has occurred during this admission (document High fall risk)  Patient's Fall Risk High Fall Risk (>13 points)  Adult Fall Risk Interventions  Required Bundle Interventions *See Row Information* High fall risk - low, moderate, and high requirements implemented  Additional Interventions Use of appropriate toileting equipment (bedpan, BSC, etc.);Room near nurses station  Screening for Fall Injury Risk  Risk For Fall Injury- See Row Information  None identified  Required Injury Bundle Interventions *See Row Information* Injury Bundle Implemented Except Low Bed  Screening for Fall Injury Risk Interventions  Specialty Low Bed Contraindicated [place] Smart Bed  Vitals  Temp 97.6 F (36.4 C)  Temp Source Oral  BP 121/61  MAP (mmHg) 77  BP Location Right Arm  BP Method Automatic  Patient Position (if appropriate) Lying  Pulse Rate 80  Pulse Rate Source Dinamap  Resp 18  Oxygen Therapy  SpO2 98 %  O2 Device Room Air  Pain Assessment  Pain Assessment No/denies pain  PCA/Epidural/Spinal Assessment  Respiratory Pattern Regular;Unlabored  Neurological  Neuro (WDL) WDL  Level of Consciousness Alert  Orientation Level Oriented X4  Cognition Appropriate at baseline  Speech Clear  Musculoskeletal  Musculoskeletal (WDL) X  Assistive Device BSC;Cane  Generalized Weakness Yes  Weight  Bearing Restrictions No  Integumentary  Integumentary (WDL) X  Skin Color Pale;Red (blanchable redness to buttocks, scab to left side of head)  Skin Condition Other (Comment) (soft tissue mass on LUE)  Skin Integrity Intact  Skin Turgor Non-tenting  Pain Screening  Clinical Progression Not changed  Pain Assessment  Work-Related Injury No

## 2017-08-22 NOTE — Progress Notes (Signed)
AM norvasc dose held for bp 92/51.

## 2017-08-22 NOTE — Progress Notes (Signed)
[facility] PROGRESS NOTE  Kimberly Castaneda FOY:[id number] DOB: 05-27-1941 DOA: 08/20/2017  PCP: [name], MD  Brief History/Interval Summary: 76 year old Caucasian female with a past medical history of chronic diastolic CHF, nonischemic cardiomyopathy, dyslipidemia, hypertension, type 2 diabetes, chronic leukocytosis, presented after an episode of near syncope. Patient was brought into the hospital the following day. Patient was found to have hypercalcemia. Leukocytosis  Reason for Visit: near syncope  Consultants: none  Procedures:  Left lower extremity venous Doppler Left - Positive of an acute DVT noted in the posterior tibial vein coursing through the popliteal, the confluence of the gastrocnemius with the popliteal. And to the mid femoral veins. No evidence of a superficial thrombosis or Baker's cyst. There Is no propagation to the right.  Antibiotics: none  Subjective/Interval History: Patient states that she is feeling better today. Still feels fatigued. Denies any chest pain, shortness of breath.  ROS: denies any nausea, vomiting  Objective:  Vital Signs  Vitals:   08/22/17 0239 08/22/17 0611 08/22/17 0812 08/22/17 0921  BP: 121/61 122/65 123/61 (!) 92/51  Pulse: 80 76 81   Resp: 18 18 18    Temp: 97.6 F (36.4 C) 97.7 F (36.5 C)    TempSrc: Oral Oral    SpO2: 98% 98% 98%   Weight:      Height:        Intake/Output Summary (Last 24 hours) at 08/22/17 1144 Last data filed at 08/22/17 0900  Gross per 24 hour  Intake          1069.17 ml  Output              300 ml  Net           769.17 ml   Filed Weights   08/11/2017 1831  Weight: 54.9 kg (121 lb 0.5 oz)    General appearance: alert, cooperative, appears stated age and no distress Head: Normocephalic, without obvious abnormality, atraumatic Resp: clear to auscultation bilaterally Cardio: regular rate and rhythm, S1, S2 normal, no murmur, click, rub or gallop GI: soft, non-tender; bowel  sounds normal; no masses,  no organomegaly Extremities: left leg noted to be slightly larger compared to right Neurologic: no focal neurological deficits.  Lab Results:  Data Reviewed: I have personally reviewed following labs and imaging studies  CBC:  Recent Labs Lab 09/03/2017 1038 08/17/2017 1725 08/22/17 0422  WBC 31.9* 30.6* 26.9*  NEUTROABS 29.0*  --  24.2*  HGB 9.6* 8.7* 8.0*  HCT 29.8* 26.8* 24.5*  MCV 87.6 87.6 87.5  PLT 619* 654* 510*    Basic Metabolic Panel:  Recent Labs Lab 08/11/2017 1038 08/06/2017 1725 08/22/17 0422  NA 138  --  133*  K 4.9  --  4.1  CL 102  --  100*  CO2 22  --  22  GLUCOSE 169*  --  160*  BUN 31*  --  23*  CREATININE 1.17* 0.96 0.89  CALCIUM 12.8*  --  10.9*    GFR: Estimated Creatinine Clearance: 43.2 mL/min (by C-G formula based on SCr of 0.89 mg/dL).  Liver Function Tests:  Recent Labs Lab 09/03/2017 1038 08/22/17 0422  AST 17 13*  ALT 11* 10*  ALKPHOS 135* 108  BILITOT 0.7 0.5  PROT 7.0 5.9*  ALBUMIN 2.8* 2.3*    Cardiac Enzymes:  Recent Labs Lab 08/11/2017 1038  CKTOTAL 11  TROPONINI <0.03    HbA1C:  Recent Labs  08/18/2017 1725  HGBA1C 6.4*    CBG:  Recent Labs  Lab 09/02/2017 1727 08/24/2017 2058 08/22/17 0738  GLUCAP 133* 268* 127*    Thyroid Function Tests:  Recent Labs  08/19/2017 1824  TSH 0.535    Anemia Panel:  Recent Labs  08/22/17 0422  RETICCTPCT 1.5     Radiology Studies: Dg Chest 2 View  Result Date: 08/11/2017 CLINICAL DATA:  Syncopal episode yesterday with fall, initial encounter EXAM: CHEST  2 VIEW COMPARISON:  07/05/2017 FINDINGS: Cardiac shadow is within normal limits. The lungs are well aerated bilaterally. No focal infiltrate or sizable effusion is seen. No acute bony abnormality is noted. Degenerative changes of the thoracic spine are seen. IMPRESSION: No active cardiopulmonary disease. Electronically Signed   By: Inez Catalina M.D.   On: 08/20/2017 10:45   Ct Head Wo  Contrast  Result Date: 08/22/2017 CLINICAL DATA:  Patient fell to the floor while using bedside commode. EXAM: CT HEAD WITHOUT CONTRAST CT CERVICAL SPINE WITHOUT CONTRAST TECHNIQUE: Multidetector CT imaging of the head and cervical spine was performed following the standard protocol without intravenous contrast. Multiplanar CT image reconstructions of the cervical spine were also generated. COMPARISON:  08/28/2017 FINDINGS: CT HEAD FINDINGS Brain: Diffuse cerebral atrophy. Ventricular dilatation consistent with central atrophy. Low-attenuation changes in the deep white matter consistent with small vessel ischemia. Encephalomalacia consistent with old infarct in the left occipital region. No mass effect or midline shift. No abnormal extra-axial fluid collections. Gray-white matter junctions are distinct. Basal cisterns are not effaced. No acute intracranial hemorrhage. Vascular: Prominent intracranial vascular calcifications. Skull: The calvarium appears intact. Sinuses/Orbits: Paranasal sinuses and mastoid air cells are not opacified. Other: No significant change since previous study. CT CERVICAL SPINE FINDINGS Alignment: Normal alignment of the cervical spine. Skull base and vertebrae: Skullbase appears intact. No vertebral compression deformities. No focal bone lesion or bone destruction. 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 C1-2 articulation appears intact. Soft tissues and spinal canal: No prevertebral soft tissue swelling. No paraspinal soft tissue infiltration or mass. No visible canal hematoma. Disc levels: Diffuse degenerative changes throughout the cervical spine with bridging anterior osteophytes. Upper chest: Lung apices are clear. Other: None. IMPRESSION: 1. No acute intracranial abnormalities.  Old left occipital infarct. 2. Normal alignment of the cervical spine. Degenerative changes throughout with bridging anterior osteophytes. No acute displaced fractures identified. Electronically  Signed   By: Lucienne Capers M.D.   On: 08/22/2017 04:03   Ct Head Wo Contrast  Result Date: 08/29/2017 CLINICAL DATA:  Fall. EXAM: CT HEAD WITHOUT CONTRAST CT CERVICAL SPINE WITHOUT CONTRAST TECHNIQUE: Multidetector CT imaging of the head and cervical spine was performed following the standard protocol without intravenous contrast. Multiplanar CT image reconstructions of the cervical spine were also generated. COMPARISON:  MRI brain dated July 20, 2010. FINDINGS: CT HEAD FINDINGS Brain: No evidence of acute infarction, hemorrhage, hydrocephalus, extra-axial collection or mass lesion/mass effect. 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 Mild age-related cerebral atrophy with compensatory dilatation of the ventricles, unchanged. Vascular: Intracranial atherosclerotic vascular calcifications. No hyperdense vessel. Skull: Normal. Negative for fracture or focal lesion. Sinuses/Orbits: The bilateral paranasal sinuses and mastoid air cells are clear. The orbits are unremarkable. Other: None. CT CERVICAL SPINE FINDINGS Alignment: Normal. Skull base and vertebrae: No acute fracture. 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 No primary bone lesion or focal pathologic process. Soft tissues and spinal canal: No prevertebral fluid or swelling. No visible canal hematoma. Disc levels: Prominent anterior endplate flowing osteophytes, consistent with diffuse idiopathic skeletal hyperostosis. Disc spaces  are relatively preserved. Upper chest: Negative. Other: None. IMPRESSION: 1. No acute intracranial abnormality. 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 2.  No acute cervical spine fracture.  Changes consistent with DISH. Electronically Signed   By: Titus Dubin M.D.   On: 09/03/2017 10:54   Ct Cervical Spine Wo Contrast  Result Date: 08/22/2017 CLINICAL DATA:  Patient fell to the floor while using bedside commode. EXAM: CT HEAD WITHOUT CONTRAST CT CERVICAL SPINE WITHOUT CONTRAST  TECHNIQUE: Multidetector CT imaging of the head and cervical spine was performed following the standard protocol without intravenous contrast. Multiplanar CT image reconstructions of the cervical spine were also generated. COMPARISON:  08/26/2017 FINDINGS: CT HEAD FINDINGS Brain: Diffuse cerebral atrophy. Ventricular dilatation consistent with central atrophy. Low-attenuation changes in the deep white matter consistent with small vessel ischemia. Encephalomalacia consistent with old infarct in the left occipital region. No mass effect or midline shift. No abnormal extra-axial fluid collections. Gray-white matter junctions are distinct. Basal cisterns are not effaced. No acute intracranial hemorrhage. Vascular: Prominent intracranial vascular calcifications. Skull: The calvarium appears intact. Sinuses/Orbits: Paranasal sinuses and mastoid air cells are not opacified. Other: No significant change since previous study. CT CERVICAL SPINE FINDINGS Alignment: Normal alignment of the cervical spine. Skull base and vertebrae: Skullbase appears intact. No vertebral compression deformities. No focal bone lesion or bone destruction. 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 C1-2 articulation appears intact. Soft tissues and spinal canal: No prevertebral soft tissue swelling. No paraspinal soft tissue infiltration or mass. No visible canal hematoma. Disc levels: Diffuse degenerative changes throughout the cervical spine with bridging anterior osteophytes. Upper chest: Lung apices are clear. Other: None. IMPRESSION: 1. No acute intracranial abnormalities.  Old left occipital infarct. 2. Normal alignment of the cervical spine. Degenerative changes throughout with bridging anterior osteophytes. No acute displaced fractures identified. Electronically Signed   By: Lucienne Capers M.D.   On: 08/22/2017 04:03   Ct Cervical Spine Wo Contrast  Result Date: 08/29/2017 CLINICAL DATA:  Fall. EXAM: CT HEAD WITHOUT CONTRAST CT  CERVICAL SPINE WITHOUT CONTRAST TECHNIQUE: Multidetector CT imaging of the head and cervical spine was performed following the standard protocol without intravenous contrast. Multiplanar CT image reconstructions of the cervical spine were also generated. COMPARISON:  MRI brain dated July 20, 2010. FINDINGS: CT HEAD FINDINGS Brain: No evidence of acute infarction, hemorrhage, hydrocephalus, extra-axial collection or mass lesion/mass effect. 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 Mild age-related cerebral atrophy with compensatory dilatation of the ventricles, unchanged. Vascular: Intracranial atherosclerotic vascular calcifications. No hyperdense vessel. Skull: Normal. Negative for fracture or focal lesion. Sinuses/Orbits: The bilateral paranasal sinuses and mastoid air cells are clear. The orbits are unremarkable. Other: None. CT CERVICAL SPINE FINDINGS Alignment: Normal. Skull base and vertebrae: No acute fracture. 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 No primary bone lesion or focal pathologic process. Soft tissues and spinal canal: No prevertebral fluid or swelling. No visible canal hematoma. Disc levels: Prominent anterior endplate flowing osteophytes, consistent with diffuse idiopathic skeletal hyperostosis. Disc spaces are relatively preserved. Upper chest: Negative. Other: None. IMPRESSION: 1. No acute intracranial abnormality. 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 2.  No acute cervical spine fracture.  Changes consistent with DISH. Electronically Signed   By: Titus Dubin M.D.   On: 08/15/2017 10:54     Medications:  Scheduled: . amLODipine  10 mg Oral Daily  . aspirin EC  81 mg Oral Daily  . atorvastatin  20 mg Oral Daily  . calcitonin (salmon)  4  spray Alternating Nares TID  . carvedilol  25 mg Oral BID WC  . enoxaparin (LOVENOX) injection  1 mg/kg Subcutaneous Q12H  . insulin aspart  0-5 Units Subcutaneous QHS  . insulin aspart  0-9 Units  Subcutaneous TID WC  . omega-3 acid ethyl esters  1 g Oral Daily   Continuous:  QIW:[id number] **OR** acetaminophen, fluticasone, hydrALAZINE, magnesium citrate, ondansetron (ZOFRAN) IV  Assessment/Plan:  Active Problems:   CKD (chronic kidney disease) stage 3, GFR 30-59 ml/min (HCC)   Non-insulin dependent type 2 diabetes mellitus (HCC)   Dyslipidemia   Chronic diastolic CHF (congestive heart failure), NYHA class 1 (HCC)   Syncope, vasovagal   Hypercalcemia   Leukocytosis    Near syncope Etiology for this is not entirely clear, but could be due to dehydration. She was noted to be hypercalcemic at admission. Patient has been found to have a left lower extremity DVT. Could she have had a PE?Marland Kitchen It is possible, although her vital signs are stable. She is not dyspneic. Denies any chest pain. She did have elevated creatinine at admission and has CKD. We will hold off on CT angiogram for now as patient needs to be on anticoagulation anyway for her lower extremity DVT. PT and OT evaluation due to recent fall.  Left lower extremity DVT. Detected on her extremity Doppler study. Placed on full dose Lovenox.  Leukocytosis. This is a chronic issue. Has been present for at least last 1 year. Likely worse due to dehydration. Will need outpatient evaluation by oncology/hematology. LDH is 153.  Hypercalcemia. Most likely due to dehydration. With hydration, calcium level has improved to 10.9. Albumin is 2.3. Corrected calcium is 12.2. Continue IV fluids for 1 more day. She was also started on calcitonin.intact PTH normal. TSH normal.  History of nonischemic cardiomyopathy/chronic diastolic dysfunction. Last EF was 50%. Currently euvolemic.  Diabetes mellitus type 2. Continue sliding scale insulin. Monitor CBGs.HbA1c 6.4.  Chronic kidney disease stage II Close to baseline.  History of essential hypertension Continue home medications.  Normocytic anemia. Monitor hemoglobin closely. No  evidence for overt bleeding.   DVT Prophylaxis: Full dose Lovenox    Code Status: full code  Family Communication: discussed with the patient  Disposition Plan: management as outlined above    LOS: 0 days   [facility] Pager [phone] 08/22/2017, 11:44 AM  If 7PM-7AM, please contact night-coverage at [email or web address], password [facility]

## 2017-08-22 NOTE — Consult Note (Signed)
New Hematology/Oncology Consult   Referral MD: Bonnielee Haff, MD  Reason for Referral: leukocytosis, hypercalcemia  HPI:  Kimberly Castaneda is a 76 y.o. who presented to the emergency room yesterday due to a near syncopal episode by ambulance after suffering an event last night and being unable to get up off the floor throughout the night. She felt lightheaded, dizziness, without loss of consciousness, involuntary movements,loss of sphincter control. Lower to the floor, was unable to get up. Was found the husband this morning and 911 was called bringing her to the emergency room. As reported by husband multiple times during the emergency room stay. On having conversation with them today, he is feeling better, but they both report that she has not been feeling well with progressive deterioration of overall performance status over the past 3 months. She did not have any fevers, chills, night sweats. T2 level has diminished dramatically and she spends most of the day sitting in the chair only getting up to go to bathroom. She does not spend more than half a day in bed, but she has stopped participating in most activities of daily living. Patient denies headaches, vision changes, shortness of breath, chest pain, palpitations, nausea, vomiting, abdominal pain, diarrhea, or constipation. Denies dysuria or hematuria. Denies melena or hematochezia.denies any numbness, tingling,or weakness in the extremities. Denies any pain in the bones.  On review, leukocytosis has been present for at least 6 years. Previously, patient has been evaluated formally by Dr. Alen Blew while hospitalized in Jan 2012. At that time, Barnabas Lister 2 and BCR able testing was acquired, but results are not available at this point in time.    Past Medical History:  Diagnosis Date  . CKD (chronic kidney disease) stage 3, GFR 30-59 ml/min (HCC)   . Diabetes mellitus ([place])    type 2  . Hypercholesterolemia   . Mitral regurgitation   . Nonischemic  cardiomyopathy ([place])   . Systemic hypertension   . Vertigo   :  Past Surgical History:  Procedure Laterality Date  . CARDIAC CATHETERIZATION  07/18/2010   r/t nonsichemic cardiomyopathy; normal coronary anatomy; EF 20%;   . TONSILLECTOMY    . TUBAL LIGATION    . US ECHOCARDIOGRAPHY  04/03/2012   EF 55%; mild LVH  :   Current Facility-Administered Medications:  .  acetaminophen (TYLENOL) tablet 650 mg, 650 mg, Oral, Q6H PRN **OR** acetaminophen (TYLENOL) suppository 650 mg, 650 mg, Rectal, Q6H PRN, Lala Lund K, MD .  amLODipine (NORVASC) tablet 10 mg, 10 mg, Oral, Daily, Thurnell Lose, MD, 10 mg at 08/30/2017 1947 .  atorvastatin (LIPITOR) tablet 20 mg, 20 mg, Oral, Daily, Thurnell Lose, MD, 20 mg at 08/22/17 0920 .  calcitonin (salmon) (MIACALCIN/FORTICAL) nasal spray 4 spray, 4 spray, Alternating Nares, TID, Thurnell Lose, MD, 4 spray at 08/22/17 0919 .  carvedilol (COREG) tablet 25 mg, 25 mg, Oral, BID WC, Thurnell Lose, MD, 25 mg at 08/22/17 0813 .  dextrose 5 %-0.45 % sodium chloride infusion, , Intravenous, Continuous, Bonnielee Haff, MD, Last Rate: 75 mL/hr at 08/22/17 1221 .  enoxaparin (LOVENOX) injection 55 mg, 1 mg/kg, Subcutaneous, Q12H, Emiliano Dyer, RPH, 55 mg at 08/22/17 1226 .  fluticasone (FLONASE) 50 MCG/ACT nasal spray 1-2 spray, 1-2 spray, Each Nare, Daily PRN, Thurnell Lose, MD .  hydrALAZINE (APRESOLINE) injection 10 mg, 10 mg, Intravenous, Q6H PRN, Candiss Norse, Prashant K, MD .  insulin aspart (novoLOG) injection 0-5 Units, 0-5 Units, Subcutaneous, QHS, Candiss Norse, Margaree Mackintosh, MD,  3 Units at 08/23/2017 2254 .  insulin aspart (novoLOG) injection 0-9 Units, 0-9 Units, Subcutaneous, TID WC, Thurnell Lose, MD, 2 Units at 08/22/17 1224 .  magnesium citrate solution 1 Bottle, 1 Bottle, Oral, Once PRN, Lala Lund K, MD .  omega-3 acid ethyl esters (LOVAZA) capsule 1 g, 1 g, Oral, Daily, Thurnell Lose, MD, 1 g at 08/22/17 0920 .  ondansetron  (ZOFRAN) injection 4 mg, 4 mg, Intravenous, Q6H PRN, Thurnell Lose, MD:  . amLODipine  10 mg Oral Daily  . atorvastatin  20 mg Oral Daily  . calcitonin (salmon)  4 spray Alternating Nares TID  . carvedilol  25 mg Oral BID WC  . enoxaparin (LOVENOX) injection  1 mg/kg Subcutaneous Q12H  . insulin aspart  0-5 Units Subcutaneous QHS  . insulin aspart  0-9 Units Subcutaneous TID WC  . omega-3 acid ethyl esters  1 g Oral Daily  :  Allergies  Allergen Reactions  . Erythromycin Nausea And Vomiting  . Quinapril Other (See Comments)    unknown  . Ciprofloxacin Hcl Nausea Only  :  History reviewed. No pertinent family history.   Social History   Social History  . Marital status: Married    Spouse name: N/A  . Number of children: N/A  . Years of education: N/A   Occupational History  . Not on file.   Social History Main Topics  . Smoking status: Never Smoker  . Smokeless tobacco: Never Used  . Alcohol use No  . Drug use: No  . Sexual activity: Not on file   Other Topics Concern  . Not on file   Social History Narrative  . No narrative on file    Review of Systems: As per history of present illness. All other systems are negative.   Physical Exam:  Blood pressure (!) 112/51, pulse 81, temperature 98 F (36.7 C), temperature source Oral, resp. rate 20, height 5' 2"  (1.575 m), weight 121 lb 0.5 oz (54.9 kg), SpO2 98 %.  Patient appears to be fatigued, weak, elderly female. HEENT: anicteric sclera, moist mucous membranes Lungs: Clear to auscultation anteriorly bilaterally Cardiac: S1/S2, regular, no murmurs Abdomen: mildly distended, soft, nontender. No palpable masses or hepatosplenomegaly. Vascular: no significant swelling in bilateral lower extremities. No palpable vascular cords in the left lower extremity. Lymph nodes: no palpable lymphadenopathy in the cervical, axillary, Neurologic:globally, strength is decreased. Left lower extremity 4 out of 5 for  proximal and distal strength compared to 5 out of 5 on the right. Skin: dry, no significant petechia or ecchymosis. Musculoskeletal: large soft tissue mass in the left arm laterally consistent with lipoma  LABS:   Recent Labs  08/27/2017 1725 08/22/17 0422  WBC 30.6* 26.9*  HGB 8.7* 8.0*  HCT 26.8* 24.5*  PLT 654* 510*     Recent Labs  08/25/2017 1038 08/28/2017 1725 08/22/17 0422  NA 138  --  133*  K 4.9  --  4.1  CL 102  --  100*  CO2 22  --  22  GLUCOSE 169*  --  160*  BUN 31*  --  23*  CREATININE 1.17* 0.96 0.89  CALCIUM 12.8*  --  10.9*      RADIOLOGY:  Dg Chest 2 View  Result Date: 08/19/2017 CLINICAL DATA:  Syncopal episode yesterday with fall, initial encounter EXAM: CHEST  2 VIEW COMPARISON:  07/05/2017 FINDINGS: Cardiac shadow is within normal limits. The lungs are well aerated bilaterally. No focal infiltrate or sizable effusion  is seen. No acute bony abnormality is noted. Degenerative changes of the thoracic spine are seen. IMPRESSION: No active cardiopulmonary disease. Electronically Signed   By: Inez Catalina M.D.   On: 08/20/2017 10:45   Ct Head Wo Contrast  Result Date: 08/22/2017 CLINICAL DATA:  Patient fell to the floor while using bedside commode. EXAM: CT HEAD WITHOUT CONTRAST CT CERVICAL SPINE WITHOUT CONTRAST TECHNIQUE: Multidetector CT imaging of the head and cervical spine was performed following the standard protocol without intravenous contrast. Multiplanar CT image reconstructions of the cervical spine were also generated. COMPARISON:  08/27/2017 FINDINGS: CT HEAD FINDINGS Brain: Diffuse cerebral atrophy. Ventricular dilatation consistent with central atrophy. Low-attenuation changes in the deep white matter consistent with small vessel ischemia. Encephalomalacia consistent with old infarct in the left occipital region. No mass effect or midline shift. No abnormal extra-axial fluid collections. Gray-white matter junctions are distinct. Basal cisterns  are not effaced. No acute intracranial hemorrhage. Vascular: Prominent intracranial vascular calcifications. Skull: The calvarium appears intact. Sinuses/Orbits: Paranasal sinuses and mastoid air cells are not opacified. Other: No significant change since previous study. CT CERVICAL SPINE FINDINGS Alignment: Normal alignment of the cervical spine. Skull base and vertebrae: Skullbase appears intact. No vertebral compression deformities. No focal bone lesion or bone destruction. 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 C1-2 articulation appears intact. Soft tissues and spinal canal: No prevertebral soft tissue swelling. No paraspinal soft tissue infiltration or mass. No visible canal hematoma. Disc levels: Diffuse degenerative changes throughout the cervical spine with bridging anterior osteophytes. Upper chest: Lung apices are clear. Other: None. IMPRESSION: 1. No acute intracranial abnormalities.  Old left occipital infarct. 2. Normal alignment of the cervical spine. Degenerative changes throughout with bridging anterior osteophytes. No acute displaced fractures identified. Electronically Signed   By: Lucienne Capers M.D.   On: 08/22/2017 04:03   Ct Head Wo Contrast  Result Date: 08/28/2017 CLINICAL DATA:  Fall. EXAM: CT HEAD WITHOUT CONTRAST CT CERVICAL SPINE WITHOUT CONTRAST TECHNIQUE: Multidetector CT imaging of the head and cervical spine was performed following the standard protocol without intravenous contrast. Multiplanar CT image reconstructions of the cervical spine were also generated. COMPARISON:  MRI brain dated July 20, 2010. FINDINGS: CT HEAD FINDINGS Brain: No evidence of acute infarction, hemorrhage, hydrocephalus, extra-axial collection or mass lesion/mass effect. 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 Mild age-related cerebral atrophy with compensatory dilatation of the ventricles, unchanged. Vascular: Intracranial atherosclerotic vascular calcifications. No hyperdense vessel. Skull:  Normal. Negative for fracture or focal lesion. Sinuses/Orbits: The bilateral paranasal sinuses and mastoid air cells are clear. The orbits are unremarkable. Other: None. CT CERVICAL SPINE FINDINGS Alignment: Normal. Skull base and vertebrae: No acute fracture. 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 No primary bone lesion or focal pathologic process. Soft tissues and spinal canal: No prevertebral fluid or swelling. No visible canal hematoma. Disc levels: Prominent anterior endplate flowing osteophytes, consistent with diffuse idiopathic skeletal hyperostosis. Disc spaces are relatively preserved. Upper chest: Negative. Other: None. IMPRESSION: 1. No acute intracranial abnormality. 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 2.  No acute cervical spine fracture.  Changes consistent with DISH. Electronically Signed   By: Titus Dubin M.D.   On: 08/26/2017 10:54   Ct Cervical Spine Wo Contrast  Result Date: 08/22/2017 CLINICAL DATA:  Patient fell to the floor while using bedside commode. EXAM: CT HEAD WITHOUT CONTRAST CT CERVICAL SPINE WITHOUT CONTRAST TECHNIQUE: Multidetector CT imaging of the head and cervical spine was performed following the standard  protocol without intravenous contrast. Multiplanar CT image reconstructions of the cervical spine were also generated. COMPARISON:  08/20/2017 FINDINGS: CT HEAD FINDINGS Brain: Diffuse cerebral atrophy. Ventricular dilatation consistent with central atrophy. Low-attenuation changes in the deep white matter consistent with small vessel ischemia. Encephalomalacia consistent with old infarct in the left occipital region. No mass effect or midline shift. No abnormal extra-axial fluid collections. Gray-white matter junctions are distinct. Basal cisterns are not effaced. No acute intracranial hemorrhage. Vascular: Prominent intracranial vascular calcifications. Skull: The calvarium appears intact. Sinuses/Orbits: Paranasal  sinuses and mastoid air cells are not opacified. Other: No significant change since previous study. CT CERVICAL SPINE FINDINGS Alignment: Normal alignment of the cervical spine. Skull base and vertebrae: Skullbase appears intact. No vertebral compression deformities. No focal bone lesion or bone destruction. 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 C1-2 articulation appears intact. Soft tissues and spinal canal: No prevertebral soft tissue swelling. No paraspinal soft tissue infiltration or mass. No visible canal hematoma. Disc levels: Diffuse degenerative changes throughout the cervical spine with bridging anterior osteophytes. Upper chest: Lung apices are clear. Other: None. IMPRESSION: 1. No acute intracranial abnormalities.  Old left occipital infarct. 2. Normal alignment of the cervical spine. Degenerative changes throughout with bridging anterior osteophytes. No acute displaced fractures identified. Electronically Signed   By: Lucienne Capers M.D.   On: 08/22/2017 04:03   Ct Cervical Spine Wo Contrast  Result Date: 08/20/2017 CLINICAL DATA:  Fall. EXAM: CT HEAD WITHOUT CONTRAST CT CERVICAL SPINE WITHOUT CONTRAST TECHNIQUE: Multidetector CT imaging of the head and cervical spine was performed following the standard protocol without intravenous contrast. Multiplanar CT image reconstructions of the cervical spine were also generated. COMPARISON:  MRI brain dated July 20, 2010. FINDINGS: CT HEAD FINDINGS Brain: No evidence of acute infarction, hemorrhage, hydrocephalus, extra-axial collection or mass lesion/mass effect. 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 Mild age-related cerebral atrophy with compensatory dilatation of the ventricles, unchanged. Vascular: Intracranial atherosclerotic vascular calcifications. No hyperdense vessel. Skull: Normal. Negative for fracture or focal lesion. Sinuses/Orbits: The bilateral paranasal sinuses and mastoid air cells are clear. The orbits are unremarkable. Other:  None. CT CERVICAL SPINE FINDINGS Alignment: Normal. Skull base and vertebrae: No acute fracture. 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 No primary bone lesion or focal pathologic process. Soft tissues and spinal canal: No prevertebral fluid or swelling. No visible canal hematoma. Disc levels: Prominent anterior endplate flowing osteophytes, consistent with diffuse idiopathic skeletal hyperostosis. Disc spaces are relatively preserved. Upper chest: Negative. Other: None. IMPRESSION: 1. No acute intracranial abnormality. 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 2.  No acute cervical spine fracture.  Changes consistent with DISH. Electronically Signed   By: Titus Dubin M.D.   On: 08/28/2017 10:54    Assessment and Plan:  76yo female visit to the Hospital after presentation with near syncope, weakness, and possible protracted immobility due to fall. Admission labs significant for severe hypercalcemia as well as leukocytosis. Leukocytosis chronic, etiology not entirely clear.Hypercalcemia etiology is yet to be determined at this point in time.Overnight, patient was hydrated aggressively.subsequently, calcium level appears to be improving slightly.  **Hypercalcemia: Etiology is not entirely clear. Differential includes hypercalcemia for mobility considering protracted period of decreased mobility that patient has reported, and recent period of spending night on the floor not being able to get up. Other etiologies are possible such as hyperparathyroidism, or neoplastic syndrome with parathyroid related peptide secretion, and destructive process in the skeletal structures. Imaging off the neck and had  do not immediately demonstrate lytic skeletal lesions in those regions. --Recommend continued hydration --Recommend Zoledronic acid 60m IV x1 --Recommend CT C/A/P for possible malignancy evaluation --SPEP/SIFE, PRT-rp pending at this time  **Leukocytosis: predominantly  neutrophilia and monocytosis with differential including a reactive leukocytosis due to chronic infection such as possible fungal infection or non-hematological malignancy versus primary hematological problem such as chronic myelogenous or chronic myelomonocytic leukemia, MDS, MPN.No immature forms reported in the peripheral blood to suggest more acute process. --Continue daily CBC with differential --if leukocytosis stabilizes, or gets worse, we mayobtain bone marrow biopsy during hospital stay  **Anemia: normocytic normochromic anemia with inappropriately low reticulocyte count. Consistent with anemia of chronic disease or primary bone marrow suppression due to hematological malignancy. --Recommend transfusion at Hgb <=7.0g/dL with 1 Unit pRBC.     MArdath Sax MD 08/22/2017, 2:37 PM

## 2017-08-22 NOTE — Progress Notes (Signed)
PT Cancellation Note  Patient Details Name: Kimberly Castaneda MRN: [id number] DOB: [birth year]   Cancelled Treatment:    Reason Eval/Treat Not Completed: Medical issues which prohibited therapy , RN tested orthostatics, noted near syncope. Check back tomorrow.  Claretha Cooper 08/22/2017, 1:52 PM Tresa Endo PT [phone]

## 2017-08-23 ENCOUNTER — Inpatient Hospital Stay (HOSPITAL_COMMUNITY): Payer: Medicare Other

## 2017-08-23 ENCOUNTER — Encounter (HOSPITAL_COMMUNITY): Payer: Self-pay | Admitting: Radiology

## 2017-08-23 DIAGNOSIS — I82492 Acute embolism and thrombosis of other specified deep vein of left lower extremity: Secondary | ICD-10-CM

## 2017-08-23 DIAGNOSIS — N183 Chronic kidney disease, stage 3 (moderate): Secondary | ICD-10-CM

## 2017-08-23 LAB — GLUCOSE, CAPILLARY
GLUCOSE-CAPILLARY: 231 mg/dL — AB (ref 65–99)
GLUCOSE-CAPILLARY: 208 mg/dL — AB (ref 65–99)
Glucose-Capillary: 211 mg/dL — ABNORMAL HIGH (ref 65–99)
Glucose-Capillary: 244 mg/dL — ABNORMAL HIGH (ref 65–99)

## 2017-08-23 LAB — CBC
HEMATOCRIT: 24.6 % — AB (ref 36.0–46.0)
HEMOGLOBIN: 7.9 g/dL — AB (ref 12.0–15.0)
MCH: 28.1 pg (ref 26.0–34.0)
MCHC: 32.1 g/dL (ref 30.0–36.0)
MCV: 87.5 fL (ref 78.0–100.0)
Platelets: 529 10*3/uL — ABNORMAL HIGH (ref 150–400)
RBC: 2.81 MIL/uL — AB (ref 3.87–5.11)
RDW: 14.8 % (ref 11.5–15.5)
WBC: 25.2 10*3/uL — ABNORMAL HIGH (ref 4.0–10.5)

## 2017-08-23 LAB — BASIC METABOLIC PANEL
Anion gap: 12 (ref 5–15)
BUN: 17 mg/dL (ref 6–20)
CHLORIDE: 103 mmol/L (ref 101–111)
CO2: 19 mmol/L — AB (ref 22–32)
CREATININE: 0.85 mg/dL (ref 0.44–1.00)
Calcium: 9.7 mg/dL (ref 8.9–10.3)
GFR calc non Af Amer: >60 mL/min (ref >60)
Glucose, Bld: 202 mg/dL — ABNORMAL HIGH (ref 65–99)
POTASSIUM: 3.6 mmol/L (ref 3.5–5.1)
Sodium: 134 mmol/L — ABNORMAL LOW (ref 135–145)

## 2017-08-23 LAB — OCCULT BLOOD X 1 CARD TO LAB, STOOL: FECAL OCCULT BLD: NEGATIVE

## 2017-08-23 MED ORDER — IOPAMIDOL (ISOVUE-300) INJECTION 61%
INTRAVENOUS | Status: AC
  Administered 2017-08-23: 10:00:00
  Filled 2017-08-23: qty 30

## 2017-08-23 MED ORDER — SODIUM CHLORIDE 0.9 % IV SOLN
1.5000 g | Freq: Four times a day (QID) | INTRAVENOUS | Status: DC
  Administered 2017-08-23 – 2017-08-24 (×3): 1.5 g via INTRAVENOUS
  Filled 2017-08-23 (×4): qty 1.5

## 2017-08-23 MED ORDER — IOPAMIDOL (ISOVUE-370) INJECTION 76%
100.0000 mL | Freq: Once | INTRAVENOUS | Status: AC | PRN
  Administered 2017-08-23: 100 mL via INTRAVENOUS

## 2017-08-23 MED ORDER — SODIUM CHLORIDE 0.9 % IV BOLUS (SEPSIS)
500.0000 mL | Freq: Once | INTRAVENOUS | Status: AC
  Administered 2017-08-23: 500 mL via INTRAVENOUS

## 2017-08-23 MED ORDER — DEXTROSE-NACL 5-0.45 % IV SOLN
INTRAVENOUS | Status: AC
  Administered 2017-08-24 (×2): via INTRAVENOUS

## 2017-08-23 MED ORDER — IOPAMIDOL (ISOVUE-370) INJECTION 76%
INTRAVENOUS | Status: AC
  Filled 2017-08-23: qty 100

## 2017-08-23 NOTE — Progress Notes (Signed)
[facility] PROGRESS NOTE  Kimberly Castaneda HMC:[id number] DOB: 08-09-1941 DOA: 08/09/2017  PCP: [name], MD  Brief History/Interval Summary: 76 year old Caucasian female with a past medical history of chronic diastolic CHF, nonischemic cardiomyopathy, dyslipidemia, hypertension, type 2 diabetes, chronic leukocytosis, presented after an episode of near syncope. Patient was brought into the hospital the following day. Patient was found to have hypercalcemia. Leukocytosis. Patient also found to have DVT in the left lower extremity.  Reason for Visit: near syncope  Consultants: none  Procedures:  Left lower extremity venous Doppler Left - Positive for an acute DVT noted in the posterior tibial vein coursing through the popliteal, the confluence of the gastrocnemius with the popliteal. And to the mid femoral veins. No evidence of a superficial thrombosis or Baker's cyst. There is no propagation to the right.  Transthoracic echocardiogram Study Conclusions  - Left ventricle: The cavity size was normal. Wall thickness was   normal. Systolic function was normal. The estimated ejection   fraction was in the range of 55% to 60%. Wall motion was normal;   there were no regional wall motion abnormalities. Doppler   parameters are consistent with abnormal left ventricular   relaxation (grade 1 diastolic dysfunction). Impressions: - Normal LV systolic function; mild diastolic dysfunction.  Antibiotics: none  Subjective/Interval History: Patient not very communicative. Per nursing staff she has not been eating much. She reports poor appetite but denies any nausea, vomiting or abdominal pain.  ROS: denies any headaches  Objective:  Vital Signs  Vitals:   08/22/17 0921 08/22/17 1433 08/22/17 2040 08/23/17 0800  BP: (!) 92/51 (!) 112/51 111/65 137/68  Pulse:  81 86 94  Resp:  20 20 18   Temp:  98 F (36.7 C) 98.7 F (37.1 C)   TempSrc:  Oral Oral   SpO2:  98% 97% 96%    Weight:      Height:        Intake/Output Summary (Last 24 hours) at 08/23/17 1202 Last data filed at 08/23/17 1000  Gross per 24 hour  Intake          2178.75 ml  Output                0 ml  Net          2178.75 ml   Filed Weights   08/07/2017 1831  Weight: 54.9 kg (121 lb 0.5 oz)    General appearance: awake alert. Distracted. In no distress. Resp: clear to auscultation bilaterally Cardio: S1 S2 is normal, regular. No S3, S4. No rubs, murmurs or bruit GI: abdomen soft. Nondistended. Tenderness appreciated in the right lower quadrant without any rebound, rigidity or guarding. No masses or organomegaly Extremities: left leg noted to be slightly larger compared to right Neurologic: she is awake, alert. No obvious focal neurological deficits. Remains distracted.  Lab Results:  Data Reviewed: I have personally reviewed following labs and imaging studies  CBC:  Recent Labs Lab 08/06/2017 1038 08/19/2017 1725 08/22/17 0422 08/23/17 0457  WBC 31.9* 30.6* 26.9* 25.2*  NEUTROABS 29.0*  --  24.2*  --   HGB 9.6* 8.7* 8.0* 7.9*  HCT 29.8* 26.8* 24.5* 24.6*  MCV 87.6 87.6 87.5 87.5  PLT 619* 654* 510* 529*    Basic Metabolic Panel:  Recent Labs Lab 08/15/2017 1038 08/24/2017 1725 08/22/17 0422 08/23/17 0457  NA 138  --  133* 134*  K 4.9  --  4.1 3.6  CL 102  --  100* 103  CO2 22  --  22 19*  GLUCOSE 169*  --  160* 202*  BUN 31*  --  23* 17  CREATININE 1.17* 0.96 0.89 0.85  CALCIUM 12.8*  --  10.9* 9.7    GFR: Estimated Creatinine Clearance: 45.2 mL/min (by C-G formula based on SCr of 0.85 mg/dL).  Liver Function Tests:  Recent Labs Lab 09/02/2017 1038 08/22/17 0422  AST 17 13*  ALT 11* 10*  ALKPHOS 135* 108  BILITOT 0.7 0.5  PROT 7.0 5.9*  ALBUMIN 2.8* 2.3*    Cardiac Enzymes:  Recent Labs Lab 08/20/2017 1038  CKTOTAL 52  TROPONINI <0.03    HbA1C:  Recent Labs  08/07/2017 1725  HGBA1C 6.4*    CBG:  Recent Labs Lab 08/22/17 0738 08/22/17 1153  08/22/17 1601 08/22/17 1947 08/23/17 0813  GLUCAP 127* 167* 128* 158* 211*    Thyroid Function Tests:  Recent Labs  08/26/2017 1824  TSH 0.535    Anemia Panel:  Recent Labs  08/22/17 0422  RETICCTPCT 1.5     Radiology Studies: Ct Head Wo Contrast  Result Date: 08/22/2017 CLINICAL DATA:  Patient fell to the floor while using bedside commode. EXAM: CT HEAD WITHOUT CONTRAST CT CERVICAL SPINE WITHOUT CONTRAST TECHNIQUE: Multidetector CT imaging of the head and cervical spine was performed following the standard protocol without intravenous contrast. Multiplanar CT image reconstructions of the cervical spine were also generated. COMPARISON:  08/31/2017 FINDINGS: CT HEAD FINDINGS Brain: Diffuse cerebral atrophy. Ventricular dilatation consistent with central atrophy. Low-attenuation changes in the deep white matter consistent with small vessel ischemia. Encephalomalacia consistent with old infarct in the left occipital region. No mass effect or midline shift. No abnormal extra-axial fluid collections. Gray-white matter junctions are distinct. Basal cisterns are not effaced. No acute intracranial hemorrhage. Vascular: Prominent intracranial vascular calcifications. Skull: The calvarium appears intact. Sinuses/Orbits: Paranasal sinuses and mastoid air cells are not opacified. Other: No significant change since previous study. CT CERVICAL SPINE FINDINGS Alignment: Normal alignment of the cervical spine. Skull base and vertebrae: Skullbase appears intact. No vertebral compression deformities. No focal bone lesion or bone destruction. 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 C1-2 articulation appears intact. Soft tissues and spinal canal: No prevertebral soft tissue swelling. No paraspinal soft tissue infiltration or mass. No visible canal hematoma. Disc levels: Diffuse degenerative changes throughout the cervical spine with bridging anterior osteophytes. Upper chest: Lung apices are clear.  Other: None. IMPRESSION: 1. No acute intracranial abnormalities.  Old left occipital infarct. 2. Normal alignment of the cervical spine. Degenerative changes throughout with bridging anterior osteophytes. No acute displaced fractures identified. Electronically Signed   By: Lucienne Capers M.D.   On: 08/22/2017 04:03   Ct Cervical Spine Wo Contrast  Result Date: 08/22/2017 CLINICAL DATA:  Patient fell to the floor while using bedside commode. EXAM: CT HEAD WITHOUT CONTRAST CT CERVICAL SPINE WITHOUT CONTRAST TECHNIQUE: Multidetector CT imaging of the head and cervical spine was performed following the standard protocol without intravenous contrast. Multiplanar CT image reconstructions of the cervical spine were also generated. COMPARISON:  08/13/2017 FINDINGS: CT HEAD FINDINGS Brain: Diffuse cerebral atrophy. Ventricular dilatation consistent with central atrophy. Low-attenuation changes in the deep white matter consistent with small vessel ischemia. Encephalomalacia consistent with old infarct in the left occipital region. No mass effect or midline shift. No abnormal extra-axial fluid collections. Gray-white matter junctions are distinct. Basal cisterns are not effaced. No acute intracranial hemorrhage. Vascular: Prominent intracranial vascular calcifications. Skull: The calvarium appears intact. Sinuses/Orbits: Paranasal sinuses and mastoid air cells  are not opacified. Other: No significant change since previous study. CT CERVICAL SPINE FINDINGS Alignment: Normal alignment of the cervical spine. Skull base and vertebrae: Skullbase appears intact. No vertebral compression deformities. No focal bone lesion or bone destruction. 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 C1-2 articulation appears intact. Soft tissues and spinal canal: No prevertebral soft tissue swelling. No paraspinal soft tissue infiltration or mass. No visible canal hematoma. Disc levels: Diffuse degenerative changes throughout the cervical  spine with bridging anterior osteophytes. Upper chest: Lung apices are clear. Other: None. IMPRESSION: 1. No acute intracranial abnormalities.  Old left occipital infarct. 2. Normal alignment of the cervical spine. Degenerative changes throughout with bridging anterior osteophytes. No acute displaced fractures identified. Electronically Signed   By: Lucienne Capers M.D.   On: 08/22/2017 04:03     Medications:  Scheduled: . atorvastatin  20 mg Oral Daily  . carvedilol  12.5 mg Oral BID WC  . enoxaparin (LOVENOX) injection  1 mg/kg Subcutaneous Q12H  . insulin aspart  0-5 Units Subcutaneous QHS  . insulin aspart  0-9 Units Subcutaneous TID WC  . omega-3 acid ethyl esters  1 g Oral Daily   Continuous:  XHB:[id number] **OR** acetaminophen, fluticasone, hydrALAZINE, magnesium citrate, ondansetron (ZOFRAN) IV  Assessment/Plan:  Active Problems:   CKD (chronic kidney disease) stage 3, GFR 30-59 ml/min (HCC)   Non-insulin dependent type 2 diabetes mellitus (HCC)   Dyslipidemia   Chronic diastolic CHF (congestive heart failure), NYHA class 1 (HCC)   Syncope, vasovagal   Hypercalcemia   Leukocytosis   Anemia    Near syncope Etiology for this is not entirely clear, but could be due to dehydration. She was noted to be hypercalcemic at admission. Patient has been found to have a left lower extremity DVT. CT angiogram chest been ordered by oncology. Creatinine is normal today.  Acute Left lower extremity DVT. Detected on her extremity Doppler study. Placed on full dose Lovenox. We will change it to oral anticoagulation when she is closer to discharge.  Leukocytosis. This is a chronic issue. Has been present for at least last 1 year. Likely worse due to dehydration. LDH is 153. Hematology/oncology is following. They have ordered a CT scan of the chest, abdomen and pelvis.  Hypercalcemia. Most likely due to dehydration. Patient was hydrated, given a dose of Lasix and given calcitonin.  Her calcium level is 9.7 today. Intact PTH normal. TSH normal.  History of nonischemic cardiomyopathy/chronic diastolic dysfunction. Currently euvolemic. Echocardiogram shows normal systolic function with grade 1 diastolic dysfunction.  Diabetes mellitus type 2. Continue sliding scale insulin. Monitor CBGs.HbA1c 6.4.  Chronic kidney disease stage II Close to baseline. Monitor urine output.  History of essential hypertension Continue home medications.  Normocytic anemia. Mild drop in hemoglobin noted. No evidence for overt bleeding.   DVT Prophylaxis: Full dose Lovenox    Code Status: full code  Family Communication: discussed with the patient  Disposition Plan: management as outlined above. Await CT scan of the chest, abdomen and pelvis.    LOS: 1 day   [facility] Pager [phone] 08/23/2017, 12:02 PM  If 7PM-7AM, please contact night-coverage at [email or web address], password [facility]

## 2017-08-23 NOTE — Progress Notes (Signed)
Pt w/ temp of 102.2 axillary. Pt w/ increased lethargy in comparison with this AM. Barely arousable to voice, opens eyes, moans, and recoils to sternal rub. Dr [place] Pink made aware. Tylenol suppository given. Orders to hold oral coreg at this time. All other VSS. Will monitor.

## 2017-08-23 NOTE — Progress Notes (Signed)
PT Cancellation Note  Patient Details Name: Kimberly Castaneda MRN: [id number] DOB: 12-17-1940   Cancelled Treatment:    Reason Eval/Treat Not Completed: Other (comment), RN reports that patient has been given  Results of tests today. Will check back tomorrow.   Marcelino Freestone PT [phone]  08/23/2017, 4:50 PM

## 2017-08-23 NOTE — Consult Note (Signed)
IP PROGRESS NOTE  Subjective:  No new complaints since yesterday. Intermittent abdominal pain while drinking oral contrast for CT. Patient remains very hypoactive, not engaging much.  Objective: Vital signs in last 24 hours: Blood pressure 137/68, pulse 94, temperature 98.7 F (37.1 C), temperature source Oral, resp. rate 18, height 5' 2"  (1.575 m), weight 121 lb 0.5 oz (54.9 kg), SpO2 96 %.  Intake/Output from previous day: 10/17 0701 - 10/18 0700 In: 1878.8 [P.O.:130; I.V.:1248.8; IV Piggyback:500] Out: 0   Physical Exam: Patient appears to be fatigued, weak, elderly female. HEENT: anicteric sclera, moist mucous membranes Lungs: Clear to auscultation anteriorly bilaterally Cardiac: S1/S2, regular, no murmurs Abdomen: mildly distended, soft, nontender. No palpable masses or hepatosplenomegaly. Vascular: no significant swelling in bilateral lower extremities. No palpable vascular cords in the left lower extremity. Lymph nodes: no palpable lymphadenopathy in the cervical, axillary, Neurologic:globally, strength is decreased. Left lower extremity 4 out of 5 for proximal and distal strength compared to 5 out of 5 on the right. Skin: dry, no significant petechia or ecchymosis. Musculoskeletal: large soft tissue mass in the left arm laterally consistent with lipoma Portacath/PICC-without erythema  Lab Results:  Recent Labs  08/22/17 0422 08/23/17 0457  WBC 26.9* 25.2*  HGB 8.0* 7.9*  HCT 24.5* 24.6*  PLT 510* 529*    BMET  Recent Labs  08/22/17 0422 08/23/17 0457  NA 133* 134*  K 4.1 3.6  CL 100* 103  CO2 22 19*  GLUCOSE 160* 202*  BUN 23* 17  CREATININE 0.89 0.85  CALCIUM 10.9* 9.7    No results found for: CEA1  Studies/Results: Ct Head Wo Contrast  Result Date: 08/22/2017 CLINICAL DATA:  Patient fell to the floor while using bedside commode. EXAM: CT HEAD WITHOUT CONTRAST CT CERVICAL SPINE WITHOUT CONTRAST TECHNIQUE: Multidetector CT imaging of the head and  cervical spine was performed following the standard protocol without intravenous contrast. Multiplanar CT image reconstructions of the cervical spine were also generated. COMPARISON:  08/12/2017 FINDINGS: CT HEAD FINDINGS Brain: Diffuse cerebral atrophy. Ventricular dilatation consistent with central atrophy. Low-attenuation changes in the deep white matter consistent with small vessel ischemia. Encephalomalacia consistent with old infarct in the left occipital region. No mass effect or midline shift. No abnormal extra-axial fluid collections. Gray-white matter junctions are distinct. Basal cisterns are not effaced. No acute intracranial hemorrhage. Vascular: Prominent intracranial vascular calcifications. Skull: The calvarium appears intact. Sinuses/Orbits: Paranasal sinuses and mastoid air cells are not opacified. Other: No significant change since previous study. CT CERVICAL SPINE FINDINGS Alignment: Normal alignment of the cervical spine. Skull base and vertebrae: Skullbase appears intact. No vertebral compression deformities. No focal bone lesion or bone destruction. 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 C1-2 articulation appears intact. Soft tissues and spinal canal: No prevertebral soft tissue swelling. No paraspinal soft tissue infiltration or mass. No visible canal hematoma. Disc levels: Diffuse degenerative changes throughout the cervical spine with bridging anterior osteophytes. Upper chest: Lung apices are clear. Other: None. IMPRESSION: 1. No acute intracranial abnormalities.  Old left occipital infarct. 2. Normal alignment of the cervical spine. Degenerative changes throughout with bridging anterior osteophytes. No acute displaced fractures identified. Electronically Signed   By: Lucienne Capers M.D.   On: 08/22/2017 04:03   Ct Cervical Spine Wo Contrast  Result Date: 08/22/2017 CLINICAL DATA:  Patient fell to the floor while using bedside commode. EXAM: CT HEAD WITHOUT CONTRAST CT  CERVICAL SPINE WITHOUT CONTRAST TECHNIQUE: Multidetector CT imaging of the head and cervical spine  was performed following the standard protocol without intravenous contrast. Multiplanar CT image reconstructions of the cervical spine were also generated. COMPARISON:  08/06/2017 FINDINGS: CT HEAD FINDINGS Brain: Diffuse cerebral atrophy. Ventricular dilatation consistent with central atrophy. Low-attenuation changes in the deep white matter consistent with small vessel ischemia. Encephalomalacia consistent with old infarct in the left occipital region. No mass effect or midline shift. No abnormal extra-axial fluid collections. Gray-white matter junctions are distinct. Basal cisterns are not effaced. No acute intracranial hemorrhage. Vascular: Prominent intracranial vascular calcifications. Skull: The calvarium appears intact. Sinuses/Orbits: Paranasal sinuses and mastoid air cells are not opacified. Other: No significant change since previous study. CT CERVICAL SPINE FINDINGS Alignment: Normal alignment of the cervical spine. Skull base and vertebrae: Skullbase appears intact. No vertebral compression deformities. No focal bone lesion or bone destruction. 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 C1-2 articulation appears intact. Soft tissues and spinal canal: No prevertebral soft tissue swelling. No paraspinal soft tissue infiltration or mass. No visible canal hematoma. Disc levels: Diffuse degenerative changes throughout the cervical spine with bridging anterior osteophytes. Upper chest: Lung apices are clear. Other: None. IMPRESSION: 1. No acute intracranial abnormalities.  Old left occipital infarct. 2. Normal alignment of the cervical spine. Degenerative changes throughout with bridging anterior osteophytes. No acute displaced fractures identified. Electronically Signed   By: Lucienne Capers M.D.   On: 08/22/2017 04:03    Medications: I have reviewed the patient's current  medications.  Assessment/Plan: 76yo female visit to the Hospital after presentation with near syncope, weakness, and possible protracted immobility due to fall. Admission labs significant for severe hypercalcemia as well as leukocytosis. Leukocytosis chronic, etiology not entirely clear.Hypercalcemia etiology is yet to be determined at this point in time.Overnight, patient was hydrated aggressively.subsequently, calcium level appears to be improving slightly.  **Hypercalcemia: Etiology is not entirely clear. Differential includes hypercalcemia for mobility considering protracted period of decreased mobility that patient has reported, and recent period of spending night on the floor not being able to get up. Other etiologies are possible such as hyperparathyroidism, or neoplastic syndrome with parathyroid related peptide secretion, and destructive process in the skeletal structures. Imaging off the neck and had do not immediately demonstrate lytic skeletal lesions in those regions. --Recommend continued hydration --Awiting CT C/A/P for possible malignancy evaluation --SPEP/SIFE, PRT-rp pending at this time  **Leukocytosis: predominantly neutrophilia and monocytosis with differential including a reactive leukocytosis due to chronic infection such as possible fungal infection or non-hematological malignancy versus primary hematological problem such as chronic myelogenous or chronic myelomonocytic leukemia, MDS, MPN.No immature forms reported in the peripheral blood to suggest more acute process. --Continue daily CBC with differential --if leukocytosis stabilizes, or gets worse, we mayobtain bone marrow biopsy during hospital stay  **Anemia: normocytic normochromic anemia with inappropriately low reticulocyte count. Consistent with anemia of chronic disease or primary bone marrow suppression due to hematological malignancy. --Recommend transfusion at Hgb <=7.0g/dL with 1 Unit pRBC.  **VTE: Patient  found to have deep vein thrombosis in the left lower extremity.likely associated with the patient has been experiencing for the protracted period of time. --Agree with enoxaparin for anticoagulation --CTA chest pending   LOS: 1 day   Ardath Sax, MD   08/23/2017, 12:53 PM

## 2017-08-23 NOTE — Progress Notes (Signed)
Pharmacy Antibiotic Note  Kimberly Castaneda is a 76 y.o. female admitted on 08/12/2017 with pneumonia.  Pharmacy has been consulted for unasyn dosing.  Plan:  unasyn 1.5gm IV q6h for renal fx and Total BW  Follow renal function  Height: 5\' 2"  (157.5 cm) Weight: 121 lb 0.5 oz (54.9 kg) IBW/kg (Calculated) : 50.1  Temp (24hrs), Avg:100.5 F (38.1 C), Min:98.7 F (37.1 C), Max:102.2 F (39 C)   Recent Labs Lab 08/17/2017 1038 09/05/2017 1725 08/22/17 0422 08/23/17 0457  WBC 31.9* 30.6* 26.9* 25.2*  CREATININE 1.17* 0.96 0.89 0.85    Estimated Creatinine Clearance: 45.2 mL/min (by C-G formula based on SCr of 0.85 mg/dL).    Allergies  Allergen Reactions  . Erythromycin Nausea And Vomiting  . Quinapril Other (See Comments)    unknown  . Ciprofloxacin Hcl Nausea Only    Antimicrobials this admission: 10/18 unasyn >>   Dose adjustments this admission:  Microbiology results: None  Thank you for allowing pharmacy to be a part of this patient's care.  Doreene Eland, PharmD, BCPS.   Pager: [phone] 08/23/2017 6:07 PM

## 2017-08-24 DIAGNOSIS — J690 Pneumonitis due to inhalation of food and vomit: Secondary | ICD-10-CM

## 2017-08-24 DIAGNOSIS — N179 Acute kidney failure, unspecified: Secondary | ICD-10-CM

## 2017-08-24 DIAGNOSIS — N859 Noninflammatory disorder of uterus, unspecified: Secondary | ICD-10-CM

## 2017-08-24 DIAGNOSIS — N189 Chronic kidney disease, unspecified: Secondary | ICD-10-CM

## 2017-08-24 LAB — BASIC METABOLIC PANEL
Anion gap: 10 (ref 5–15)
BUN: 21 mg/dL — AB (ref 6–20)
CHLORIDE: 103 mmol/L (ref 101–111)
CO2: 18 mmol/L — ABNORMAL LOW (ref 22–32)
Calcium: 8.3 mg/dL — ABNORMAL LOW (ref 8.9–10.3)
Creatinine, Ser: 1.74 mg/dL — ABNORMAL HIGH (ref 0.44–1.00)
GFR calc Af Amer: 32 mL/min — ABNORMAL LOW (ref >60)
GFR calc non Af Amer: 27 mL/min — ABNORMAL LOW (ref >60)
GLUCOSE: 208 mg/dL — AB (ref 65–99)
POTASSIUM: 3.4 mmol/L — AB (ref 3.5–5.1)
SODIUM: 131 mmol/L — AB (ref 135–145)

## 2017-08-24 LAB — CBC
HCT: 21.2 % — ABNORMAL LOW (ref 36.0–46.0)
HEMOGLOBIN: 7 g/dL — AB (ref 12.0–15.0)
MCH: 28.8 pg (ref 26.0–34.0)
MCHC: 33 g/dL (ref 30.0–36.0)
MCV: 87.2 fL (ref 78.0–100.0)
Platelets: 437 10*3/uL — ABNORMAL HIGH (ref 150–400)
RBC: 2.43 MIL/uL — AB (ref 3.87–5.11)
RDW: 15.1 % (ref 11.5–15.5)
WBC: 30.5 10*3/uL — ABNORMAL HIGH (ref 4.0–10.5)

## 2017-08-24 LAB — GLUCOSE, CAPILLARY
GLUCOSE-CAPILLARY: 183 mg/dL — AB (ref 65–99)
GLUCOSE-CAPILLARY: 160 mg/dL — AB (ref 65–99)
Glucose-Capillary: 216 mg/dL — ABNORMAL HIGH (ref 65–99)
Glucose-Capillary: 220 mg/dL — ABNORMAL HIGH (ref 65–99)

## 2017-08-24 LAB — PREPARE RBC (CROSSMATCH)

## 2017-08-24 MED ORDER — HYDROCODONE-ACETAMINOPHEN 5-325 MG PO TABS
2.0000 | ORAL_TABLET | Freq: Once | ORAL | Status: AC
  Administered 2017-08-24: 2 via ORAL
  Filled 2017-08-24: qty 2

## 2017-08-24 MED ORDER — SODIUM CHLORIDE 0.9 % IV SOLN
Freq: Once | INTRAVENOUS | Status: AC
  Administered 2017-08-30: 09:00:00 via INTRAVENOUS

## 2017-08-24 MED ORDER — HYOSCYAMINE SULFATE 0.125 MG SL SUBL
0.2500 mg | SUBLINGUAL_TABLET | Freq: Once | SUBLINGUAL | Status: AC
  Administered 2017-08-24: 0.25 mg via SUBLINGUAL
  Filled 2017-08-24: qty 2

## 2017-08-24 MED ORDER — SODIUM CHLORIDE 0.9 % IV SOLN
1.5000 g | Freq: Two times a day (BID) | INTRAVENOUS | Status: DC
  Administered 2017-08-24 – 2017-08-25 (×2): 1.5 g via INTRAVENOUS
  Filled 2017-08-24 (×2): qty 1.5

## 2017-08-24 MED ORDER — SODIUM CHLORIDE 0.9 % IV BOLUS (SEPSIS)
500.0000 mL | Freq: Once | INTRAVENOUS | Status: AC
  Administered 2017-08-24: 500 mL via INTRAVENOUS

## 2017-08-24 MED ORDER — URELLE 81 MG PO TABS
1.0000 | ORAL_TABLET | Freq: Once | ORAL | Status: DC
  Filled 2017-08-24: qty 1

## 2017-08-24 MED ORDER — SODIUM CHLORIDE 0.9 % IV BOLUS (SEPSIS)
1000.0000 mL | Freq: Once | INTRAVENOUS | Status: AC
  Administered 2017-08-24: 1000 mL via INTRAVENOUS

## 2017-08-24 MED ORDER — ENOXAPARIN SODIUM 60 MG/0.6ML ~~LOC~~ SOLN
60.0000 mg | SUBCUTANEOUS | Status: DC
  Administered 2017-08-24 – 2017-08-25 (×2): 60 mg via SUBCUTANEOUS
  Filled 2017-08-24 (×2): qty 0.6

## 2017-08-24 MED ORDER — POTASSIUM CHLORIDE 10 MEQ/100ML IV SOLN
10.0000 meq | INTRAVENOUS | Status: AC
  Administered 2017-08-24 (×3): 10 meq via INTRAVENOUS
  Filled 2017-08-24 (×3): qty 100

## 2017-08-24 NOTE — Progress Notes (Signed)
Patient alert to self with increased lethargy. Patient responds appropriately at times. BP 80/40s. MD on call notified and multiple fluid boluses given. BP now 104/54. Will continue to monitor patient closely.

## 2017-08-24 NOTE — Progress Notes (Signed)
Pharmacy Antibiotic Note  Shuree Brossart is a 76 y.o. female admitted on 08/06/2017 with pneumonia.  Pharmacy has been consulted for Unasyn dosing.  Today, 08/24/2017:  Day #2 abx  SCr - large increase overnight (0.85 > 1.74), CrCl 24 ml/min  Tm 102.2  WBC elevated and increased to 30.5  Plan:  Unasyn 1.5gm IV q12h for renal fx and Total BW  Follow renal function  Height: 5\' 2"  (157.5 cm) Weight: 132 lb 11.5 oz (60.2 kg) IBW/kg (Calculated) : 50.1  Temp (24hrs), Avg:99.1 F (37.3 C), Min:98.1 F (36.7 C), Max:102.2 F (39 C)   Recent Labs Lab 08/08/2017 1038 08/13/2017 1725 08/22/17 0422 08/23/17 0457 08/24/17 0438  WBC 31.9* 30.6* 26.9* 25.2* 30.5*  CREATININE 1.17* 0.96 0.89 0.85 1.74*    Estimated Creatinine Clearance: 23.9 mL/min (A) (by C-G formula based on SCr of 1.74 mg/dL (H)).    Allergies  Allergen Reactions  . Erythromycin Nausea And Vomiting  . Quinapril Other (See Comments)    unknown  . Ciprofloxacin Hcl Nausea Only    Antimicrobials this admission: 10/18 unasyn >>   Dose adjustments this admission: 10/19 Unasyn dose adjusted for increasing SCr.  Microbiology results: None  Thank you for allowing pharmacy to be a part of this patient's care.  Gretta Arab PharmD, BCPS Pager [phone] 08/24/2017 11:14 AM

## 2017-08-24 NOTE — Progress Notes (Signed)
Inpatient Diabetes Program Recommendations  AACE/ADA: New Consensus Statement on Inpatient Glycemic Control (2015)  Target Ranges:  Prepandial:   less than 140 mg/dL      Peak postprandial:   less than 180 mg/dL (1-2 hours)      Critically ill patients:  140 - 180 mg/dL   Lab Results  Component Value Date   GLUCAP 220 (H) 08/24/2017   HGBA1C 6.4 (H) 08/16/2017    Review of Glycemic Control  Poor po intake. Blood sugars in 200s.  Inpatient Diabetes Program Recommendations:   Change Novolog to 0-9 units Q4H while pt not eating po's May need small amount of basal insulin - Lantus 5 units QHS.  Will follow.  Thank you. Lorenda Peck, RD, LDN, CDE Inpatient Diabetes Coordinator [phone]

## 2017-08-24 NOTE — Progress Notes (Signed)
PT Cancellation Note  Patient Details Name: Kimberly Castaneda MRN: [id number] DOB: [birth year]   Cancelled Treatment:    Reason Eval/Treat Not Completed: Patient not medically ready, per RN, patient is barely arousable, BP continues to be low. Will check back another time when  medically stable.   Claretha Cooper 08/24/2017, 9:05 AM Tresa Endo PT [phone]

## 2017-08-24 NOTE — Evaluation (Signed)
Physical Therapy Evaluation Patient Details Name: Kimberly Castaneda MRN: [id number] DOB: 1941/01/26 Today's Date: 08/24/2017   History of Present Illness  76yo female visit to the Hospital after presentation with near syncope, weakness, immobility due to multiple fall. Admission labs significant for severe hypercalcemia as well as leukocytosis. CT of abdomen presents with question cervical cancer versus  Clinical Impression  The patient is very sluggish, very weak. Assisted to sitting at the bed side. BP supine 134/78, sitting 122/75, after supine134/68, HR 98-101. Pt admitted with above diagnosis. Pt currently with functional limitations due to the deficits listed below (see PT Problem List).  Pt will benefit from skilled PT to increase their independence and safety with mobility to allow discharge to the venue listed below.       Follow Up Recommendations SNF    Equipment Recommendations  None recommended by PT    Recommendations for Other Services       Precautions / Restrictions Precautions Precautions: Fall Precaution Comments: monitor orthostatics      Mobility  Bed Mobility Overal bed mobility: Needs Assistance Bed Mobility: Supine to Sit;Sit to Supine     Supine to sit: Max assist;+2 for physical assistance;+2 for safety/equipment;HOB elevated Sit to supine: Total assist;+2 for physical assistance;+2 for safety/equipment   General bed mobility comments: encouraged patient to self assist, gradually required  total assist to move to sitting and then back to supine. patient sat x 1.5 minutes. very weak, does not support self.  Transfers                 General transfer comment: unable  Ambulation/Gait                Stairs            Wheelchair Mobility    Modified Rankin (Stroke Patients Only)       Balance Overall balance assessment: Needs assistance Sitting-balance support: Bilateral upper extremity supported;Feet supported Sitting  balance-Leahy Scale: Poor   Postural control: Right lateral lean                                   Pertinent Vitals/Pain Pain Assessment: Faces Faces Pain Scale: Hurts little more Pain Location: whereever Pain Descriptors / Indicators: Discomfort;Grimacing Pain Intervention(s): Monitored during session    Home Living Family/patient expects to be discharged to:: Private residence Living Arrangements: Spouse/significant other Available Help at Discharge: Family Type of Home: House       Home Layout: One level Home Equipment: None      Prior Function Level of Independence: Independent         Comments: untol recent fallls and decline     Hand Dominance        Extremity/Trunk Assessment   Upper Extremity Assessment Upper Extremity Assessment: Generalized weakness;LUE deficits/detail LUE Deficits / Details: large mass like area upper arm    Lower Extremity Assessment Lower Extremity Assessment: Generalized weakness    Cervical / Trunk Assessment Cervical / Trunk Assessment: Normal  Communication   Communication: No difficulties  Cognition Arousal/Alertness: Lethargic Behavior During Therapy: WFL for tasks assessed/performed, flat affect Overall Cognitive Status: difficult to assess, oriented to place, name of grand baby in room.                                 General Comments: o  General Comments      Exercises     Assessment/Plan    PT Assessment Patient needs continued PT services  PT Problem List Decreased strength;Decreased activity tolerance;Decreased balance;Decreased mobility;Cardiopulmonary status limiting activity;Decreased knowledge of precautions;Decreased safety awareness;Pain       PT Treatment Interventions Functional mobility training;Therapeutic activities;Therapeutic exercise;Patient/family education    PT Goals (Current goals can be found in the Care Plan section)  Acute Rehab PT Goals Patient  Stated Goal: to take  care of my grandbaby PT Goal Formulation: With patient/family Time For Goal Achievement: 09/07/17 Potential to Achieve Goals: Fair    Frequency Min 3X/week   Barriers to discharge        Co-evaluation               AM-PAC PT "6 Clicks" Daily Activity  Outcome Measure Difficulty turning over in bed (including adjusting bedclothes, sheets and blankets)?: Unable Difficulty moving from lying on back to sitting on the side of the bed? : Unable Difficulty sitting down on and standing up from a chair with arms (e.g., wheelchair, bedside commode, etc,.)?: Unable Help needed moving to and from a bed to chair (including a wheelchair)?: Total Help needed walking in hospital room?: Total Help needed climbing 3-5 steps with a railing? : Total 6 Click Score: 6    End of Session   Activity Tolerance: Patient limited by lethargy;Patient limited by fatigue Patient left: in bed;with call bell/phone within reach;with bed alarm set;with family/visitor present Nurse Communication: Mobility status PT Visit Diagnosis: Unsteadiness on feet (R26.81);Muscle weakness (generalized) (M62.81)    Time: 7846-9629 PT Time Calculation (min) (ACUTE ONLY): 25 min   Charges:   PT Evaluation $PT Eval Low Complexity: 1 Low PT Treatments $Therapeutic Activity: 8-22 mins   PT G CodesTresa Endo PT [phone]   Claretha Cooper 08/24/2017, 2:05 PM

## 2017-08-24 NOTE — Progress Notes (Signed)
ANTICOAGULATION CONSULT NOTE - Follow up [facility] for Lovenox Indication: DVT  Allergies  Allergen Reactions  . Erythromycin Nausea And Vomiting  . Quinapril Other (See Comments)    unknown  . Ciprofloxacin Hcl Nausea Only    Patient Measurements: Height: 5\' 2"  (157.5 cm) Weight: 132 lb 11.5 oz (60.2 kg) IBW/kg (Calculated) : 50.1  Vital Signs: Temp: 98.6 F (37 C) (10/19 0440) Temp Source: Oral (10/19 0440) BP: 104/54 (10/19 0440) Pulse Rate: 85 (10/19 0440)  Labs:  Recent Labs  08/10/2017 1038  08/22/17 0422 08/23/17 0457 08/24/17 0438  HGB 9.6*  < > 8.0* 7.9* 7.0*  HCT 29.8*  < > 24.5* 24.6* 21.2*  PLT 619*  < > 510* 529* 437*  CREATININE 1.17*  < > 0.89 0.85 1.74*  CKTOTAL 52  --   --   --   --   TROPONINI <0.03  --   --   --   --   < > = values in this interval not displayed.  Estimated Creatinine Clearance: 23.9 mL/min (A) (by C-G formula based on SCr of 1.74 mg/dL (H)).   Assessment: 76 year old female admitted with near syncope.  Doppler of LLE on 10/17 reveals an acute DVT.  Pharmacy is consulted to dose Lovenox.  Patient received a dose of 40mg  10/16 22:54, changed to full dose on 10/17 AM.   SCr - large increase overnight (0.85 > 1.74), CrCl 24 ml/min  Hgb decreased to 7, Plt remain elevated at 437  No bleeding currently documented  Goal of Therapy:  Anti-Xa level 0.6-1 units/ml 4hrs after LMWH dose given Monitor platelets by anticoagulation protocol: Yes   Plan:   Lovenox 60mg  (1mg /kg) SQ q24h  CBC at least q72h  Monitor for signs/symptoms of bleeding or thrombosis  Gretta Arab PharmD, BCPS Pager [phone] 08/24/2017 11:11 AM

## 2017-08-24 NOTE — Progress Notes (Signed)
[facility] PROGRESS NOTE  Kimberly Castaneda IEP:[id number] DOB: 08/22/1941 DOA: 08/20/2017  PCP: [name], MD  Brief History/Interval Summary: 76 year old Caucasian female with a past medical history of chronic diastolic CHF, nonischemic cardiomyopathy, dyslipidemia, hypertension, type 2 diabetes, chronic leukocytosis, presented after an episode of near syncope. Patient was brought into the hospital the following day. Patient was found to have hypercalcemia. Leukocytosis. Patient also found to have DVT in the left lower extremity.  Reason for Visit: near syncope  Consultants: none  Procedures:  Left lower extremity venous Doppler Left - Positive for an acute DVT noted in the posterior tibial vein coursing through the popliteal, the confluence of the gastrocnemius with the popliteal. And to the mid femoral veins. No evidence of a superficial thrombosis or Baker's cyst. There is no propagation to the right.  Transthoracic echocardiogram Study Conclusions  - Left ventricle: The cavity size was normal. Wall thickness was   normal. Systolic function was normal. The estimated ejection   fraction was in the range of 55% to 60%. Wall motion was normal;   there were no regional wall motion abnormalities. Doppler   parameters are consistent with abnormal left ventricular   relaxation (grade 1 diastolic dysfunction). Impressions: - Normal LV systolic function; mild diastolic dysfunction.  Antibiotics: Unasyn was initiated on 10/18  Subjective/Interval History: Patient remained so distracted confused. She denies any complaints. Her husband is at the bedside who reports that patient has had very poor appetite over the last 48 hours. There has been no nausea or vomiting.  ROS: denies any headache  Objective:  Vital Signs  Vitals:   08/24/17 0046 08/24/17 0229 08/24/17 0440 08/24/17 0926  BP: (!) 82/54 (!) 114/51 (!) 104/54 (!) 116/59  Pulse: 75  85 88  Resp:   18     Temp: 98.2 F (36.8 C)  98.6 F (37 C)   TempSrc: Oral  Oral   SpO2:   97%   Weight:   60.2 kg (132 lb 11.5 oz)   Height:        Intake/Output Summary (Last 24 hours) at 08/24/17 1042 Last data filed at 08/24/17 1037  Gross per 24 hour  Intake           1252.5 ml  Output                0 ml  Net           1252.5 ml   Filed Weights   08/20/2017 1831 08/24/17 0440  Weight: 54.9 kg (121 lb 0.5 oz) 60.2 kg (132 lb 11.5 oz)    General appearance: awake alert. Distracted. Resp: clear to auscultation bilaterally Cardio: S1, S2 is normal, regular. No S3, S4. No rubs, murmurs, or bruit GI: abdomen remains soft. Nondistended. Continues to have tenderness in the lower abdomen without any rebound, rigidity or guarding. No masses or organomegaly. Extremities: left leg noted to be slightly larger compared to right Neurologic: Awake alert. Responsive but distracted and disoriented. Oriented to city husband Did not know the year. No obvious focal deficits otherwise.  Lab Results:  Data Reviewed: I have personally reviewed following labs and imaging studies  CBC:  Recent Labs Lab 09/04/2017 1038 09/01/2017 1725 08/22/17 0422 08/23/17 0457 08/24/17 0438  WBC 31.9* 30.6* 26.9* 25.2* 30.5*  NEUTROABS 29.0*  --  24.2*  --   --   HGB 9.6* 8.7* 8.0* 7.9* 7.0*  HCT 29.8* 26.8* 24.5* 24.6* 21.2*  MCV 87.6 87.6 87.5 87.5 87.2  PLT 619* 654* 510* 529* 437*    Basic Metabolic Panel:  Recent Labs Lab 08/08/2017 1038 08/13/2017 1725 08/22/17 0422 08/23/17 0457 08/24/17 0438  NA 138  --  133* 134* 131*  K 4.9  --  4.1 3.6 3.4*  CL 102  --  100* 103 103  CO2 22  --  22 19* 18*  GLUCOSE 169*  --  160* 202* 208*  BUN 31*  --  23* 17 21*  CREATININE 1.17* 0.96 0.89 0.85 1.74*  CALCIUM 12.8*  --  10.9* 9.7 8.3*    GFR: Estimated Creatinine Clearance: 23.9 mL/min (A) (by C-G formula based on SCr of 1.74 mg/dL (H)).  Liver Function Tests:  Recent Labs Lab 08/07/2017 1038 08/22/17 0422   AST 17 13*  ALT 11* 10*  ALKPHOS 135* 108  BILITOT 0.7 0.5  PROT 7.0 5.9*  ALBUMIN 2.8* 2.3*    Cardiac Enzymes:  Recent Labs Lab 08/19/2017 1038  CKTOTAL 52  TROPONINI <0.03    HbA1C:  Recent Labs  08/14/2017 1725  HGBA1C 6.4*    CBG:  Recent Labs Lab 08/23/17 0813 08/23/17 1321 08/23/17 1639 08/23/17 2139 08/24/17 0748  GLUCAP 211* 231* 244* 208* 216*    Thyroid Function Tests:  Recent Labs  09/02/2017 1824  TSH 0.535    Anemia Panel:  Recent Labs  08/22/17 0422  RETICCTPCT 1.5     Radiology Studies: Ct Angio Chest Pe W Or Wo Contrast  Result Date: 08/23/2017 CLINICAL DATA:  Near syncopal episode, hypercalcemia, leukocytosis, DVT LEFT lower extremity, anemia, thrombocytosis, suspected colon cancer, acute post hemorrhagic anemia, the history diabetes mellitus, chronic diastolic CHF, non ischemic cardiomyopathy, hypertension EXAM: CT ANGIOGRAPHY CHEST CT ABDOMEN AND PELVIS WITH CONTRAST TECHNIQUE: Multidetector CT imaging of the chest was performed using the standard protocol during bolus administration of intravenous contrast. Multiplanar CT image reconstructions and MIPs were obtained to evaluate the vascular anatomy. Multidetector CT imaging of the abdomen and pelvis was performed using the standard protocol during bolus administration of intravenous contrast. CONTRAST:  100 cc Isovue 370 IV COMPARISON:  CTA chest 07/14/2010 FINDINGS: CTA CHEST FINDINGS Cardiovascular: No atherosclerotic calcifications aorta, proximal great vessels and coronary arteries. Aorta upper normal caliber without aneurysm or dissection. No pericardial effusion. Pulmonary arteries well opacified and patent. No evidence of pulmonary embolism. Mediastinum/Nodes: Tiny hiatal hernia. Questionable mild wall thickening of the esophagus diffusely. Base of cervical region unremarkable. No thoracic adenopathy. Lungs/Pleura: Patchy infiltrate in anterior segment RIGHT upper lobe adjacent to  minor fissure. Respiratory motion artifacts at lower lungs. Several tiny nodular foci RIGHT lung up to 2 mm diameter. Minimal dependent atelectasis in lower lobes. No additional infiltrate, pleural effusion or pneumothorax. Musculoskeletal: Osseous demineralization. Suspected ankylosis of the thoracic spine. Review of the MIP images confirms the above findings. CT ABDOMEN and PELVIS FINDINGS Hepatobiliary: Gallbladder contains numerous dependent calculi. Liver normal appearance. No biliary dilatation. Pancreas: Mildly atrophic, otherwise unremarkable Spleen: Unremarkable Adrenals/Urinary Tract: Adrenal thickening without discrete mass. Nodular focus of thickening and enhancement at the mid LEFT ureter cannot exclude tumor 4 mm diameter. Kidneys ureters and bladder otherwise normal appearance. Stomach/Bowel: Normal appendix. Stomach and bowel loops normal appearance Vascular/Lymphatic: Atherosclerotic calcifications aorta and iliac arteries without aneurysm. No adenopathy. Reproductive: Enlarged irregular and abnormal appearance with large areas of fluid/necrosis and nodular areas of nodularity/enhancement extending into cervix. Overall, the abnormal appearing nodular uterus measures 8.7 x 8.5 x 15.7 cm in size. Some gas is seen at the caudal aspect of the uterine  mass near/at the cervix. Diffuse stranding of surrounding tissue planes in the pelvis with nodularity extending adjacent to the distal sigmoid colon, which is displaced to the RIGHT and appears thickened. Single small parametrial nodule on RIGHT 5 mm diameter image 60. Finding most likely either represents a large irregular cervical cancer or endometrial cancer. Other: Minimal fluid at the LEFT lateral conal fascia. No free air or additional free fluid. No definite distal peritoneal nodularity. Musculoskeletal: No acute osseous findings. Osseous demineralization with suspected ankylosis of the thoracolumbar spine. Review of the MIP images confirms the above  findings. IMPRESSION: Enlarged abnormal appearing uterus with nodular areas of enhancement, areas of cystic change/necrosis and internal gas most likely representing a neoplasm, question cervical cancer versus endometrial cancer. Diffuse stranding of surrounding fat planes cannot exclude tumor extension, with suspected small parametrial nodule on RIGHT 5 mm diameter, LEFT ureteral nodule/ metastasis 4 mm diameter, and involvement of the distal sigmoid colon. No distant metastases. No evidence of pulmonary embolism. Focus of RIGHT upper lobe infiltrate question pneumonia. Several tiny nonspecific RIGHT lung nodules. Cholelithiasis. Electronically Signed   By: Lavonia Dana M.D.   On: 08/23/2017 13:49   Ct Abdomen Pelvis W Contrast  Result Date: 08/23/2017 CLINICAL DATA:  Near syncopal episode, hypercalcemia, leukocytosis, DVT LEFT lower extremity, anemia, thrombocytosis, suspected colon cancer, acute post hemorrhagic anemia, the history diabetes mellitus, chronic diastolic CHF, non ischemic cardiomyopathy, hypertension EXAM: CT ANGIOGRAPHY CHEST CT ABDOMEN AND PELVIS WITH CONTRAST TECHNIQUE: Multidetector CT imaging of the chest was performed using the standard protocol during bolus administration of intravenous contrast. Multiplanar CT image reconstructions and MIPs were obtained to evaluate the vascular anatomy. Multidetector CT imaging of the abdomen and pelvis was performed using the standard protocol during bolus administration of intravenous contrast. CONTRAST:  100 cc Isovue 370 IV COMPARISON:  CTA chest 07/14/2010 FINDINGS: CTA CHEST FINDINGS Cardiovascular: No atherosclerotic calcifications aorta, proximal great vessels and coronary arteries. Aorta upper normal caliber without aneurysm or dissection. No pericardial effusion. Pulmonary arteries well opacified and patent. No evidence of pulmonary embolism. Mediastinum/Nodes: Tiny hiatal hernia. Questionable mild wall thickening of the esophagus diffusely.  Base of cervical region unremarkable. No thoracic adenopathy. Lungs/Pleura: Patchy infiltrate in anterior segment RIGHT upper lobe adjacent to minor fissure. Respiratory motion artifacts at lower lungs. Several tiny nodular foci RIGHT lung up to 2 mm diameter. Minimal dependent atelectasis in lower lobes. No additional infiltrate, pleural effusion or pneumothorax. Musculoskeletal: Osseous demineralization. Suspected ankylosis of the thoracic spine. Review of the MIP images confirms the above findings. CT ABDOMEN and PELVIS FINDINGS Hepatobiliary: Gallbladder contains numerous dependent calculi. Liver normal appearance. No biliary dilatation. Pancreas: Mildly atrophic, otherwise unremarkable Spleen: Unremarkable Adrenals/Urinary Tract: Adrenal thickening without discrete mass. Nodular focus of thickening and enhancement at the mid LEFT ureter cannot exclude tumor 4 mm diameter. Kidneys ureters and bladder otherwise normal appearance. Stomach/Bowel: Normal appendix. Stomach and bowel loops normal appearance Vascular/Lymphatic: Atherosclerotic calcifications aorta and iliac arteries without aneurysm. No adenopathy. Reproductive: Enlarged irregular and abnormal appearance with large areas of fluid/necrosis and nodular areas of nodularity/enhancement extending into cervix. Overall, the abnormal appearing nodular uterus measures 8.7 x 8.5 x 15.7 cm in size. Some gas is seen at the caudal aspect of the uterine mass near/at the cervix. Diffuse stranding of surrounding tissue planes in the pelvis with nodularity extending adjacent to the distal sigmoid colon, which is displaced to the RIGHT and appears thickened. Single small parametrial nodule on RIGHT 5 mm diameter image 60. Finding most likely  either represents a large irregular cervical cancer or endometrial cancer. Other: Minimal fluid at the LEFT lateral conal fascia. No free air or additional free fluid. No definite distal peritoneal nodularity. Musculoskeletal: No  acute osseous findings. Osseous demineralization with suspected ankylosis of the thoracolumbar spine. Review of the MIP images confirms the above findings. IMPRESSION: Enlarged abnormal appearing uterus with nodular areas of enhancement, areas of cystic change/necrosis and internal gas most likely representing a neoplasm, question cervical cancer versus endometrial cancer. Diffuse stranding of surrounding fat planes cannot exclude tumor extension, with suspected small parametrial nodule on RIGHT 5 mm diameter, LEFT ureteral nodule/ metastasis 4 mm diameter, and involvement of the distal sigmoid colon. No distant metastases. No evidence of pulmonary embolism. Focus of RIGHT upper lobe infiltrate question pneumonia. Several tiny nonspecific RIGHT lung nodules. Cholelithiasis. Electronically Signed   By: Lavonia Dana M.D.   On: 08/23/2017 13:49     Medications:  Scheduled: . atorvastatin  20 mg Oral Daily  . carvedilol  12.5 mg Oral BID WC  . enoxaparin (LOVENOX) injection  60 mg Subcutaneous Q24H  . insulin aspart  0-5 Units Subcutaneous QHS  . insulin aspart  0-9 Units Subcutaneous TID WC  . omega-3 acid ethyl esters  1 g Oral Daily   Continuous: . ampicillin-sulbactam (UNASYN) IV    . dextrose 5 % and 0.45% NaCl 100 mL/hr at 08/24/17 1036   OJJ:[id number] **OR** acetaminophen, fluticasone, hydrALAZINE, magnesium citrate, ondansetron (ZOFRAN) IV  Assessment/Plan:  Active Problems:   CKD (chronic kidney disease) stage 3, GFR 30-59 ml/min (HCC)   Non-insulin dependent type 2 diabetes mellitus (HCC)   Dyslipidemia   Chronic diastolic CHF (congestive heart failure), NYHA class 1 (HCC)   Syncope, vasovagal   Hypercalcemia   Leukocytosis   Anemia    Near syncope Etiology for this is not entirely clear, but could ave been due to dehydration. She was noted to be hypercalcemic at admission. Patient has been found to have a left lower extremity DVT. CT scan of the chest did not show any  pulmonary embolism.   Acute Left lower extremity DVT. Detected on her extremity Doppler study. Placed on full dose Lovenox. We will change it to oral anticoagulation when she is closer to discharge.  Uterine Mass concerning for malignancy This was seen on CT scan of the abdomen and pelvis. Discussed with GYN oncology, Dr. Delsa Sale. She recommends endometrial biopsy. This has been arranged on 10/22 at 9:30 AM at the cancer center. If the patient is still in the hospital they will take her down to the cancer center to do this. The results of the CT scan were communicated to the patient's husband. These findings could be the reason for her abdominal tenderness. No other abnormalities noted.  Acute on chronic kidney disease stage II/hypokalemia Creatinine noticed to be higher today than yesterday. She did receive contrast with her CT scan. Monitor urine output. She will be given IV fluids. Check creatinine tomorrow.  Fever/aspiration pneumonia Patient became febrile last night. Her CT scan did suggest infiltrates in the right lung. She could've aspirated. She was started on Unasyn. Continue to monitor.  Leukocytosis. This is a chronic issue. Has been present for at least last 1 year. Likely worse due to dehydration. LDH is 153. Seen by hematology and oncology. Some rise in WBC is also due to aspiration pneumonia.  Hypercalcemia. Most likely due to dehydration. Patient was hydrated, given a dose of Lasix and given calcitonin. Calcium level has improved. Intact PTH  normal. TSH normal. PTH related peptide level is pending.  History of nonischemic cardiomyopathy/chronic diastolic dysfunction. Echocardiogram shows normal systolic function with grade 1 diastolic dysfunction.  Diabetes mellitus type 2. Continue sliding scale insulin. Monitor CBGs.HbA1c 6.4.  History of essential hypertension Continue home medications.  Normocytic anemia. Hemoglobin continues to decrease. There is no evidence  for overt bleeding. Some of this is dilutional. She will be transfused 1 unit of blood today. Stool for occult blood was negative.   DVT Prophylaxis: Full dose Lovenox    Code Status: full code  Family Communication: discussed with the patient  Disposition Plan: management as outlined above.    LOS: 2 days   [facility] Pager [phone] 08/24/2017, 10:42 AM  If 7PM-7AM, please contact night-coverage at [email or web address], password [facility]

## 2017-08-25 ENCOUNTER — Inpatient Hospital Stay (HOSPITAL_COMMUNITY): Payer: Medicare Other

## 2017-08-25 DIAGNOSIS — E872 Acidosis: Secondary | ICD-10-CM

## 2017-08-25 LAB — COMPREHENSIVE METABOLIC PANEL
ALBUMIN: 1.7 g/dL — AB (ref 3.5–5.0)
ALT: 10 U/L — AB (ref 14–54)
AST: 17 U/L (ref 15–41)
Alkaline Phosphatase: 110 U/L (ref 38–126)
Anion gap: 8 (ref 5–15)
BUN: 28 mg/dL — ABNORMAL HIGH (ref 6–20)
CALCIUM: 7.9 mg/dL — AB (ref 8.9–10.3)
CHLORIDE: 106 mmol/L (ref 101–111)
CO2: 17 mmol/L — AB (ref 22–32)
Creatinine, Ser: 3.04 mg/dL — ABNORMAL HIGH (ref 0.44–1.00)
GFR calc Af Amer: 16 mL/min — ABNORMAL LOW (ref >60)
GFR calc non Af Amer: 14 mL/min — ABNORMAL LOW (ref >60)
GLUCOSE: 142 mg/dL — AB (ref 65–99)
POTASSIUM: 4.1 mmol/L (ref 3.5–5.1)
Sodium: 131 mmol/L — ABNORMAL LOW (ref 135–145)
Total Bilirubin: 0.6 mg/dL (ref 0.3–1.2)
Total Protein: 4.9 g/dL — ABNORMAL LOW (ref 6.5–8.1)

## 2017-08-25 LAB — CBC
HCT: 24.9 % — ABNORMAL LOW (ref 36.0–46.0)
Hemoglobin: 8.4 g/dL — ABNORMAL LOW (ref 12.0–15.0)
MCH: 29.7 pg (ref 26.0–34.0)
MCHC: 33.7 g/dL (ref 30.0–36.0)
MCV: 88 fL (ref 78.0–100.0)
PLATELETS: 439 10*3/uL — AB (ref 150–400)
RBC: 2.83 MIL/uL — AB (ref 3.87–5.11)
RDW: 15 % (ref 11.5–15.5)
WBC: 36.6 10*3/uL — AB (ref 4.0–10.5)

## 2017-08-25 LAB — ABO/RH: ABO/RH(D): O POS

## 2017-08-25 LAB — GLUCOSE, CAPILLARY
GLUCOSE-CAPILLARY: 132 mg/dL — AB (ref 65–99)
GLUCOSE-CAPILLARY: 130 mg/dL — AB (ref 65–99)
Glucose-Capillary: 95 mg/dL (ref 65–99)
Glucose-Capillary: 90 mg/dL (ref 65–99)

## 2017-08-25 MED ORDER — HYOSCYAMINE SULFATE 0.125 MG SL SUBL
0.2500 mg | SUBLINGUAL_TABLET | SUBLINGUAL | Status: DC | PRN
  Administered 2017-08-25: 0.25 mg via SUBLINGUAL
  Filled 2017-08-25: qty 2

## 2017-08-25 MED ORDER — SODIUM CHLORIDE 0.45 % IV SOLN
INTRAVENOUS | Status: DC
  Administered 2017-08-25 – 2017-08-27 (×3): via INTRAVENOUS

## 2017-08-25 MED ORDER — CARVEDILOL 3.125 MG PO TABS: 3.1250 mg | ORAL_TABLET | Freq: Two times a day (BID) | ORAL | Status: DC

## 2017-08-25 MED ORDER — HYDROCODONE-ACETAMINOPHEN 5-325 MG PO TABS
1.0000 | ORAL_TABLET | ORAL | Status: DC | PRN
  Administered 2017-08-25 – 2017-08-27 (×2): 2 via ORAL
  Filled 2017-08-25 (×3): qty 2

## 2017-08-25 MED ORDER — SODIUM CHLORIDE 0.9 % IV SOLN
1.5000 g | INTRAVENOUS | Status: DC
  Administered 2017-08-26 – 2017-08-28 (×3): 1.5 g via INTRAVENOUS
  Filled 2017-08-25 (×3): qty 1.5

## 2017-08-25 NOTE — Progress Notes (Signed)
Pt has had an uneventful day. She has been able to rest most of the day/ Family at bedside and able to answer questions and concerns. Husband appreciative if the care she has received. Bladder scan at 1130 100cc of urine and at 1400 115cc of urine. Cont with plan of care

## 2017-08-25 NOTE — Consult Note (Addendum)
Renal Service Consult Note [facility]  Kimberly Castaneda 08/25/2017 [name] Requesting Physician:  Dr. Maryland Pink  Reason for Consult:  Acute renal failure HPI: The patient is a 76 y.o. 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  Poor appetite, ^WBC, anemia present as well. Calcium was high at 10.9.  Seen by ONC who recommended abd CT which showed a large mass in the pelvis, possible uterine cancer.   Also has DVT of LLE.   She needs endometrial biopsy.  In the meantime the creat has gone from 0.8 to 1.74 , to 3.04 today.  She did get IV contrast with the abd CT done 2 days ago on 10/18.  Asked to see for AKI.    Patient is awake but confused and doesn't provide much history.  Denies any active SOB, CP, abd pain.    Husband is at bedside.  Denies any hx of kidney problems.     Meds here: asa, norvasc, lipitor, calcitonin, coreg, viT D, loveno, lasix, microzide, vidodin, levsin, insulin, iopamidol, omege 3 caps, lovaza, ramipril, urelle, IV unasyn, tylenol,   ROS  denies CP  no joint pain   no HA  no blurry vision  no rash  no diarrhea  no nausea/ vomiting   Past Medical History  Past Medical History:  Diagnosis Date  . CKD (chronic kidney disease) stage 3, GFR 30-59 ml/min (HCC)   . Diabetes mellitus ([place])    type 2  . Hypercholesterolemia   . Mitral regurgitation   . Nonischemic cardiomyopathy ([place])   . Systemic hypertension   . Vertigo    Past Surgical History  Past Surgical History:  Procedure Laterality Date  . CARDIAC CATHETERIZATION  07/18/2010   r/t nonsichemic cardiomyopathy; normal coronary anatomy; EF 20%;   . TONSILLECTOMY    . TUBAL LIGATION    . US ECHOCARDIOGRAPHY  04/03/2012   EF 55%; mild LVH   Family History History reviewed. No pertinent family history. Social History  reports that she has never smoked. She has never used smokeless tobacco. She reports  that she does not drink alcohol or use drugs. Allergies  Allergies  Allergen Reactions  . Erythromycin Nausea And Vomiting  . Quinapril Other (See Comments)    unknown  . Ciprofloxacin Hcl Nausea Only   Home medications Prior to Admission medications   Medication Sig Start Date End Date Taking? Authorizing Provider  alendronate (FOSAMAX) 70 MG tablet Take 70 mg by mouth once a week.  04/02/13  Yes [provider]  Ascorbic Acid (VITA-C PO) Take 1 tablet by mouth daily.   Yes [provider]  aspirin 81 MG tablet Take 81 mg by mouth daily.   Yes [provider]  atorvastatin (LIPITOR) 20 MG tablet Take 20 mg by mouth daily.   Yes [provider]  carvedilol (COREG) 12.5 MG tablet Take 1 tablet (12.5 mg total) by mouth 2 (two) times daily. Patient taking differently: Take 25 mg by mouth 2 (two) times daily.  07/05/17  Yes Kilroy, Lurena Joiner K, PA-C  cholecalciferol (VITAMIN D) 1000 UNITS tablet Take 1,000 Units by mouth daily.   Yes [provider]  fluticasone (FLONASE) 50 MCG/ACT nasal spray Place 1-2 sprays into both nostrils daily as needed for allergies.  02/17/13  Yes [provider]  hydrochlorothiazide (MICROZIDE) 12.5 MG capsule Take 12.5 mg by mouth daily.   Yes [provider]  linagliptin (TRADJENTA) 5 MG TABS tablet Take 5 mg by mouth daily.   Yes [provider]  OMEGA 3 1200 MG CAPS Take 1,200 mg by mouth daily.   Yes [provider]  ramipril (ALTACE) 10 MG tablet Take 10 mg by mouth 2 (two) times daily.   Yes [provider]   Liver Function Tests  Recent Labs Lab 08/20/2017 1038 08/22/17 0422 08/25/17 0727  AST 17 13* 17  ALT 11* 10* 10*  ALKPHOS 135* 108 110  BILITOT 0.7 0.5 0.6  PROT 7.0 5.9* 4.9*  ALBUMIN 2.8* 2.3* 1.7*   No results for input(s): LIPASE, AMYLASE in the last 168 hours. CBC  Recent Labs Lab 09/05/2017 1038  08/22/17 0422 08/23/17 0457 08/24/17 0438  08/25/17 0727  WBC 31.9*  < > 26.9* 25.2* 30.5* 36.6*  NEUTROABS 29.0*  --  24.2*  --   --   --   HGB 9.6*  < > 8.0* 7.9* 7.0* 8.4*  HCT 29.8*  < > 24.5* 24.6* 21.2* 24.9*  MCV 87.6  < > 87.5 87.5 87.2 88.0  PLT 619*  < > 510* 529* 437* 439*  < > = values in this interval not displayed. Basic Metabolic Panel  Recent Labs Lab 08/06/2017 1038 08/15/2017 1725 08/22/17 0422 08/23/17 0457 08/24/17 0438 08/25/17 0727  NA 138  --  133* 134* 131* 131*  K 4.9  --  4.1 3.6 3.4* 4.1  CL 102  --  100* 103 103 106  CO2 22  --  22 19* 18* 17*  GLUCOSE 169*  --  160* 202* 208* 142*  BUN 31*  --  23* 17 21* 28*  CREATININE 1.17* 0.96 0.89 0.85 1.74* 3.04*  CALCIUM 12.8*  --  10.9* 9.7 8.3* 7.9*   Iron/TIBC/Ferritin/ %Sat    Component Value Date/Time   IRON 41 (L) 11/24/2010 0619   TIBC 170 (L) 11/24/2010 0619   FERRITIN 605 (H) 11/24/2010 0619   IRONPCTSAT 24 11/24/2010 0619    Vitals:   08/24/17 2220 08/25/17 0130 08/25/17 0230 08/25/17 0500  BP: (!) 106/45 (!) 92/44 (!) 90/39 (!) 113/54  Pulse: 87 87 88 (!) 48  Resp: 18 18 18 18   Temp: 97.9 F (36.6 C) 99.7 F (37.6 C) 98.7 F (37.1 C) 98.5 F (36.9 C)  TempSrc: Axillary Axillary Oral Oral  SpO2: 99% 100% 100% 100%  Weight:      Height:       Exam Gen frail, no distress, some facial edema No rash, cyanosis or gangrene Sclera anicteric, throat clear  No jvd or bruits Chest clear bilat RRR no MRG Abd firm, distended, diffusely tender, +bs , no hsm or ascites GU defer MS no joint effusions or deformity Ext trace LE edema / no wounds or ulcers Neuro is alert, nonfocal, gen'd weak   UA > negative 10/16  CT abd/ pelv/chest (+IV contrast) > IMPRESSION: 1) Enlarged abnormal appearing uterus with nodular areas of enhancement, areas of cystic change/necrosis and internal gas most likely representing a neoplasm, question cervical cancer versus endometrial cancer. 2) Diffuse stranding of surrounding fat planes cannot  exclude tumor extension, with suspected small parametrial nodule on RIGHT 5 mm diameter, LEFT ureteral nodule/ metastasis 4 mm diameter, and involvement of the distal sigmoid colon. 3) No distant metastases. 4) No evidence of pulmonary embolism 5) Focus of RIGHT upper lobe infiltrate question pneumonia. 6) Several tiny nonspecific RIGHT lung nodules. 7) Cholelithiasis.     Date   Creat  eGFR 2011   0.8 2012   1.10- 2.02 2014   1.37 Aug 21, 2017  0.96   Oct 18   1.74 Oct 20   3.04    Impression: 1. Acute renal failure - baseline creat 0.9 on admission.  Has contrast nephropathy.  Not sure if making urine or not , but not much at all has been recorded since admit. Is already + 6 L this admission, don't think she needs more fluids, will decrease to 40 /hr and get urine lytes and renal US.  Avoid acei/ ARB's and nsaid's.  BP's should be kept 110 - 160 range, they are soft now, will decrease her BP meds.  2. HTN - BP's soft, dc coreg 3. Uterine mass - primary issue most likely 4. LLE DVT - on IV heparin 5. AMS - multifactorial 6. Chronic diast HF - no evidence resp compromise 7. Hypercalcemia - on admit, better.  Seen by ONC.  Myeloma panel is pending.  8. Anemia - sp transfusion, Hb up to 8.4    Plan - as above  Kelly Splinter MD [employer] pager [phone]   08/25/2017, 12:57 PM

## 2017-08-25 NOTE — Progress Notes (Addendum)
Shift event note:  Multiple calls from RN since just after shift change regarding foley not draining urine.  RN irrigated but could not get return. Bladder scan = 280-300. 200 cc urine was drained from foley bag at shift change. Foley replaced w/ new foley. Still not draining. Then foley changed out for larger sized foley catheter but still no urine draining. Pt has been c/o pain w/ foley since placed around noon (per RN's report) 08/24/17 and still reporting lots of "pain w/ foley". Pt given Levsin SL (Rx out of [place] and GFR to low for Pyridium) and po Norco which greatly improved pt's discomfort but foley still not draining. RN reports she has attempted position changes and having pt sit on toilet w/o success. Repeated bladder scans reveal no significant increase in urine volume. At bedside pt noted resting quietly w/ eyes closed. She awakens easily and remains confused. She answers yes to question do you have pain but unable to articulate where or quality. Her abd is somewhat distended but uniformly so, and soft. + BS. There is TTP but appears to be generalized. Repeat bladder scan while I'm at bedside = 300. Ct abd/pelvis w/ cm yesterday revealed possible 65mm tumor (L) ureter but otherwise KUB otherwise normal appearance. Pt has rec'd blood and maintenance IVF's at [facility]. VSS and pt remains afebrile. Assessment/Plan: 1. Urinary retention: Despite foley. Unclear why foley not draining despite approx 300 cc urine in bladder and multiple interventions to encourage flow. Suspect possible obstruction d/t large uterine/cervical mass. Though did drain 200 cc at shift change. Will continue to monitor w/ q1-2h bladder scans. If there is no significant increase in urine volume (in bladder) will defer further interventions to rounding MD. Continue Levsin SL for bladder spasms and Norco for pain.  2. Abd pain: Suspect related to large 8 x 8 x10 cm pelvic mass. Doubt 300 cc urine in bladder would cause severe abd pain. Abd  exam w/o findings c/w acute abd.  Managing w/ Norco and Levsin. Continue q1-2h bladder scans to assure no significant increase in urine volume. Will continue to monitor closely on telemetry.   Jeryl Columbia, NP-C [facility] Pager [phone]

## 2017-08-25 NOTE — Progress Notes (Signed)
[facility] PROGRESS NOTE  Jaelin Devincentis IWP:[id number] DOB: 1941-10-28 DOA: 08/27/2017  PCP: [name], MD  Brief History/Interval Summary: 76 year old Caucasian female with a past medical history of chronic diastolic CHF, nonischemic cardiomyopathy, dyslipidemia, hypertension, type 2 diabetes, chronic leukocytosis, presented after an episode of near syncope. Patient was brought into the hospital the following day. Patient was found to have hypercalcemia. Leukocytosis. Patient also found to have DVT in the left lower extremity.  She was seen by oncology.  She underwent CT scan of the chest abdomen and pelvis which raised concern for uterine mass.  Subsequently patient developed acute renal failure.  Reason for Visit: near syncope  Consultants: Neurology.  Nephrology.  Procedures:  Left lower extremity venous Doppler Left - Positive for an acute DVT noted in the posterior tibial vein coursing through the popliteal, the confluence of the gastrocnemius with the popliteal. And to the mid femoral veins. No evidence of a superficial thrombosis or Baker's cyst. There is no propagation to the right.  Transthoracic echocardiogram Study Conclusions  - Left ventricle: The cavity size was normal. Wall thickness was   normal. Systolic function was normal. The estimated ejection   fraction was in the range of 55% to 60%. Wall motion was normal;   there were no regional wall motion abnormalities. Doppler   parameters are consistent with abnormal left ventricular   relaxation (grade 1 diastolic dysfunction). Impressions: - Normal LV systolic function; mild diastolic dysfunction.  Antibiotics: Unasyn was initiated on 10/18  Subjective/Interval History: Patient remains distracted and confused.  Her husband is at the bedside.  Patient unable to provide much information.  Per nursing staff she has not made much urine overnight.    ROS: Unable to do due to her  confusion  Objective:  Vital Signs  Vitals:   08/24/17 2220 08/25/17 0130 08/25/17 0230 08/25/17 0500  BP: (!) 106/45 (!) 92/44 (!) 90/39 (!) 113/54  Pulse: 87 87 88 (!) 48  Resp: 18 18 18 18   Temp: 97.9 F (36.6 C) 99.7 F (37.6 C) 98.7 F (37.1 C) 98.5 F (36.9 C)  TempSrc: Axillary Axillary Oral Oral  SpO2: 99% 100% 100% 100%  Weight:      Height:        Intake/Output Summary (Last 24 hours) at 08/25/17 1313 Last data filed at 08/25/17 1028  Gross per 24 hour  Intake             1930 ml  Output                0 ml  Net             1930 ml   Filed Weights   08/10/2017 1831 08/24/17 0440  Weight: 54.9 kg (121 lb 0.5 oz) 60.2 kg (132 lb 11.5 oz)    General appearance: Awake alert.  In no distress.  Remains confused. Resp: Clear to auscultation bilaterally.  No wheezing rales or rhonchi Cardio: S1-S2 is normal regular.  No S3-S4.  No rubs murmurs or bruit GI: Abdomen remains soft.  Noted to be mildly distended.  Continues to be tender in the lower abdomen without any rebound rigidity or guarding.  No masses or organomegaly  Extremities: Left lower extremity remains larger than right. Neurologic: Patient is awake alert.  Cooperative.  Distracted and disoriented.  No facial asymmetry.  Motor strength is equal bilateral upper and lower extremities.  Lab Results:  Data Reviewed: I have personally reviewed following labs and imaging studies  CBC:  Recent Labs Lab 08/17/2017 1038 08/18/2017 1725 08/22/17 0422 08/23/17 0457 08/24/17 0438 08/25/17 0727  WBC 31.9* 30.6* 26.9* 25.2* 30.5* 36.6*  NEUTROABS 29.0*  --  24.2*  --   --   --   HGB 9.6* 8.7* 8.0* 7.9* 7.0* 8.4*  HCT 29.8* 26.8* 24.5* 24.6* 21.2* 24.9*  MCV 87.6 87.6 87.5 87.5 87.2 88.0  PLT 619* 654* 510* 529* 437* 439*    Basic Metabolic Panel:  Recent Labs Lab 08/25/2017 1038 08/14/2017 1725 08/22/17 0422 08/23/17 0457 08/24/17 0438 08/25/17 0727  NA 138  --  133* 134* 131* 131*  K 4.9  --  4.1 3.6  3.4* 4.1  CL 102  --  100* 103 103 106  CO2 22  --  22 19* 18* 17*  GLUCOSE 169*  --  160* 202* 208* 142*  BUN 31*  --  23* 17 21* 28*  CREATININE 1.17* 0.96 0.89 0.85 1.74* 3.04*  CALCIUM 12.8*  --  10.9* 9.7 8.3* 7.9*    GFR: Estimated Creatinine Clearance: 13.7 mL/min (A) (by C-G formula based on SCr of 3.04 mg/dL (H)).  Liver Function Tests:  Recent Labs Lab 08/30/2017 1038 08/22/17 0422 08/25/17 0727  AST 17 13* 17  ALT 11* 10* 10*  ALKPHOS 135* 108 110  BILITOT 0.7 0.5 0.6  PROT 7.0 5.9* 4.9*  ALBUMIN 2.8* 2.3* 1.7*    Cardiac Enzymes:  Recent Labs Lab 08/18/2017 1038  CKTOTAL 52  TROPONINI <0.03    CBG:  Recent Labs Lab 08/24/17 1113 08/24/17 1648 08/24/17 2218 08/25/17 0818 08/25/17 1147  GLUCAP 220* 183* 160* 132* 130*     Radiology Studies: No results found.   Medications:  Scheduled: . atorvastatin  20 mg Oral Daily  . carvedilol  12.5 mg Oral BID WC  . enoxaparin (LOVENOX) injection  60 mg Subcutaneous Q24H  . insulin aspart  0-5 Units Subcutaneous QHS  . insulin aspart  0-9 Units Subcutaneous TID WC  . omega-3 acid ethyl esters  1 g Oral Daily   Continuous: . sodium chloride    . sodium chloride    . [START ON 08/26/2017] ampicillin-sulbactam (UNASYN) IV     ZOX:[id number] **OR** acetaminophen, fluticasone, hydrALAZINE, HYDROcodone-acetaminophen, magnesium citrate, ondansetron (ZOFRAN) IV  Assessment/Plan:  Active Problems:   CKD (chronic kidney disease) stage 3, GFR 30-59 ml/min (HCC)   Non-insulin dependent type 2 diabetes mellitus (HCC)   Dyslipidemia   Chronic diastolic CHF (congestive heart failure), NYHA class 1 (HCC)   Syncope, vasovagal   Hypercalcemia   Leukocytosis   Anemia    Acute on chronic kidney disease stage II/non-anion gap metabolic acidosis Creatinine continues to rise.  She has not made much urine.  She does have a Foley catheter.  The uterus can cause pressure on the urinary bladder but no evidence of  obstruction noted on recent CT scan.  CT scan done recently did not show any hydronephrosis.  We will order renal ultrasound.  Yellow urine as noted in the Foley catheter but much output.  Patient received contrast for the CT scan 2 days ago.  She was also started on Unasyn 2 days ago for aspiration pneumonia.  Etiology for her worsening renal function is not clear.  UA will be ordered.  Nephrology will be consulted.   Near syncope Etiology for this is not entirely clear, but could ave been due to dehydration. She was noted to be hypercalcemic at admission. Patient has been found to have a left lower  extremity DVT. CT scan of the chest did not show any pulmonary embolism.   Acute Left lower extremity DVT. Detected on her extremity Doppler study. Placed on full dose Lovenox. We will change it to oral anticoagulation when she is closer to discharge.  Dose of Lovenox may need to be adjusted if renal function continues to get worse.  Uterine Mass concerning for malignancy This was seen on CT scan of the abdomen and pelvis. Discussed with GYN oncology, Dr. Delsa Sale. She recommends endometrial biopsy. This has been arranged on 10/22 at 9:30 AM at the cancer center. If the patient is still in the hospital they will take her down to the cancer center to do this. The results of the CT scan were communicated to the patient's husband. These findings could be the reason for her abdominal tenderness. No other abnormalities noted.  Fever/aspiration pneumonia Patient became febrile on the night of 10/18. Her CT scan suggested infiltrates in the right lung. She could've aspirated. She was started on Unasyn.  She remains afebrile.  WBC continues to climb.  Continue to monitor.  Leukocytosis. He has had elevated WBC on and off for the past year or so.  Reason for acute worsening is not entirely clear.  Could be related to hypovolemia, aspiration pneumonia.  LDH was normal at 153.  Patient has been seen by  hematology and oncology.    Hypercalcemia. Most likely due to dehydration. Patient was hydrated, given a dose of Lasix and given calcitonin. Calcium level has improved. Intact PTH normal. TSH normal. PTH related peptide level is pending.  History of nonischemic cardiomyopathy/chronic diastolic dysfunction. Echocardiogram shows normal systolic function with grade 1 diastolic dysfunction.  Diabetes mellitus type 2. Continue sliding scale insulin. Monitor CBGs. HbA1c 6.4.  History of essential hypertension Blood pressure noted to be borderline low.  We will hold her beta-blocker for today.  Reassess tomorrow morning.  Normocytic anemia. Hemoglobin was noted to be 7 on 10/19.  Patient was transfused 1 unit of blood.  Hemoglobin has responded.  There has been no evidence for overt bleeding.  Stool for occult blood was negative.     DVT Prophylaxis: Full dose Lovenox    Code Status: full code  Family Communication: Discussed with the patient's husband Disposition Plan: management as outlined above.    LOS: 3 days   [facility] Pager [phone] 08/25/2017, 1:13 PM  If 7PM-7AM, please contact night-coverage at [email or web address], password [facility]

## 2017-08-25 NOTE — Progress Notes (Signed)
Pharmacy Antibiotic Note  Kimberly Castaneda is a 76 y.o. female admitted on 08/24/2017 with pneumonia.  Pharmacy has been consulted for Unasyn dosing.  Today, 08/25/2017:  Day #3 abx  SCr continues to rise (0.85 > 1.74 > 3.04), CrCl 62ml/min  Afebrile  WBC elevated and rising  Plan:  Change Unasyn 1.5gm IV q12h to q24h  Follow renal function  Height: 5\' 2"  (157.5 cm) Weight: 132 lb 11.5 oz (60.2 kg) IBW/kg (Calculated) : 50.1  Temp (24hrs), Avg:98.7 F (37.1 C), Min:97.9 F (36.6 C), Max:99.7 F (37.6 C)   Recent Labs Lab 08/26/2017 1725 08/22/17 0422 08/23/17 0457 08/24/17 0438 08/25/17 0727  WBC 30.6* 26.9* 25.2* 30.5* 36.6*  CREATININE 0.96 0.89 0.85 1.74* 3.04*    Estimated Creatinine Clearance: 13.7 mL/min (A) (by C-G formula based on SCr of 3.04 mg/dL (H)).    Allergies  Allergen Reactions  . Erythromycin Nausea And Vomiting  . Quinapril Other (See Comments)    unknown  . Ciprofloxacin Hcl Nausea Only    Antimicrobials this admission: 10/18 unasyn >>   Dose adjustments this admission: 10/19 Unasyn dose adjusted for increasing SCr. 10/20 Unasyn dose adjusted again   Microbiology results: None  Thank you for allowing pharmacy to be a part of this patient's care.  Romeo Rabon, PharmD. Mobile: [phone]. 08/25/2017,10:26 AM.

## 2017-08-26 DIAGNOSIS — I824Z2 Acute embolism and thrombosis of unspecified deep veins of left distal lower extremity: Secondary | ICD-10-CM

## 2017-08-26 LAB — URINALYSIS, ROUTINE W REFLEX MICROSCOPIC
BILIRUBIN URINE: NEGATIVE
Glucose, UA: NEGATIVE mg/dL
Ketones, ur: 5 mg/dL — AB
NITRITE: NEGATIVE
PH: 5 (ref 5.0–8.0)
Protein, ur: 100 mg/dL — AB
SPECIFIC GRAVITY, URINE: 1.029 (ref 1.005–1.030)

## 2017-08-26 LAB — TYPE AND SCREEN
ABO/RH(D): O POS
Antibody Screen: NEGATIVE
Unit division: 0

## 2017-08-26 LAB — CBC
HEMATOCRIT: 25.2 % — AB (ref 36.0–46.0)
Hemoglobin: 8.6 g/dL — ABNORMAL LOW (ref 12.0–15.0)
MCH: 29.7 pg (ref 26.0–34.0)
MCHC: 34.1 g/dL (ref 30.0–36.0)
MCV: 86.9 fL (ref 78.0–100.0)
Platelets: 466 10*3/uL — ABNORMAL HIGH (ref 150–400)
RBC: 2.9 MIL/uL — ABNORMAL LOW (ref 3.87–5.11)
RDW: 15.6 % — AB (ref 11.5–15.5)
WBC: 37 10*3/uL — ABNORMAL HIGH (ref 4.0–10.5)

## 2017-08-26 LAB — BPAM RBC
BLOOD PRODUCT EXPIRATION DATE: 201811162359
ISSUE DATE / TIME: 201810200159
Unit Type and Rh: 5100

## 2017-08-26 LAB — BASIC METABOLIC PANEL
Anion gap: 12 (ref 5–15)
BUN: 34 mg/dL — ABNORMAL HIGH (ref 6–20)
CALCIUM: 7.8 mg/dL — AB (ref 8.9–10.3)
CHLORIDE: 105 mmol/L (ref 101–111)
CO2: 15 mmol/L — AB (ref 22–32)
Creatinine, Ser: 3.94 mg/dL — ABNORMAL HIGH (ref 0.44–1.00)
GFR calc Af Amer: 12 mL/min — ABNORMAL LOW (ref >60)
GFR calc non Af Amer: 10 mL/min — ABNORMAL LOW (ref >60)
Glucose, Bld: 96 mg/dL (ref 65–99)
Potassium: 4.4 mmol/L (ref 3.5–5.1)
Sodium: 132 mmol/L — ABNORMAL LOW (ref 135–145)

## 2017-08-26 LAB — GLUCOSE, CAPILLARY
GLUCOSE-CAPILLARY: 102 mg/dL — AB (ref 65–99)
GLUCOSE-CAPILLARY: 112 mg/dL — AB (ref 65–99)
GLUCOSE-CAPILLARY: 119 mg/dL — AB (ref 65–99)
Glucose-Capillary: 123 mg/dL — ABNORMAL HIGH (ref 65–99)

## 2017-08-26 LAB — SODIUM, URINE, RANDOM: Sodium, Ur: 33 mmol/L

## 2017-08-26 LAB — HEPARIN LEVEL (UNFRACTIONATED): HEPARIN UNFRACTIONATED: 0.48 [IU]/mL (ref 0.30–0.70)

## 2017-08-26 LAB — CREATININE, URINE, RANDOM: Creatinine, Urine: 113.76 mg/dL

## 2017-08-26 LAB — PTH-RELATED PEPTIDE: PTH-related peptide: 2.1 pmol/L

## 2017-08-26 MED ORDER — HEPARIN (PORCINE) IN NACL 100-0.45 UNIT/ML-% IJ SOLN
900.0000 [IU]/h | INTRAMUSCULAR | Status: AC
  Administered 2017-08-26: 900 [IU]/h via INTRAVENOUS
  Filled 2017-08-26: qty 250

## 2017-08-26 NOTE — Progress Notes (Signed)
Patient had an uneventful day.  Resting in bed with husband at bedside.  Little urine output during shift.  Patient slow to respond to commands today.  Cont. With plan of care.

## 2017-08-26 NOTE — Progress Notes (Signed)
Updated MD pt flagged sepsis advisory, and UA results. New order to collect urine culture.

## 2017-08-26 NOTE — Progress Notes (Signed)
ANTICOAGULATION CONSULT NOTE - Follow up [place] for Lovenox --> IV heparin Indication: DVT  Allergies  Allergen Reactions  . Erythromycin Nausea And Vomiting  . Quinapril Other (See Comments)    unknown  . Ciprofloxacin Hcl Nausea Only    Patient Measurements: Height: 5\' 2"  (157.5 cm) Weight: 136 lb 0.4 oz (61.7 kg) IBW/kg (Calculated) : 50.1  Vital Signs: Temp: 98.6 F (37 C) (10/21 1520) Temp Source: Oral (10/21 1520) BP: 137/74 (10/21 1520) Pulse Rate: 101 (10/21 1520)  Labs:  Recent Labs  08/24/17 0438 08/25/17 0727 08/26/17 0517 08/26/17 1740  HGB 7.0* 8.4* 8.6*  --   HCT 21.2* 24.9* 25.2*  --   PLT 437* 439* 466*  --   HEPARINUNFRC  --   --   --  0.48  CREATININE 1.74* 3.04* 3.94*  --     Estimated Creatinine Clearance: 10.7 mL/min (A) (by C-G formula based on SCr of 3.94 mg/dL (H)).   Assessment: 76 year old female admitted with near syncope.  Doppler of LLE on 10/17 reveals an acute DVT.  Pharmacy is consulted to dose Lovenox.  Patient received a dose of 40mg  10/16 22:54, changed to full dose on 10/17 AM.  10/19-20: Acute rise in SCr and little UOP. Nephrology consulting, likely contrast nephropathy. 10/21: Switch from Lovenox to IV heparin. No oral anticoagulant yet per [facility].   Heparin level in target range on 900units/hr.   SCr - cont to rise. CrCl 11 ml/min. UOP very low.  CBC ok.   No bleeding currently documented  Goal of Therapy:  Heparin level 0.3-0.7 units/ml Monitor platelets by anticoagulation protocol: Yes   Plan:  Cont heparin infusion at 900 units/hr. Stopping at 0600 for biopsy. Check heparin level w/ am labs at 0500 and daily thereafter. Check CBC q24h while on heparin.  F/u daily.  Romeo Rabon, PharmD. Mobile: [phone]. 08/26/2017,7:22 PM.

## 2017-08-26 NOTE — Progress Notes (Signed)
Pt had minimal amount of urine output via foley this shift (75 ml). Bladder scanner revealed 100 ml. Will continue to monitor closely.

## 2017-08-26 NOTE — Progress Notes (Signed)
[facility] PROGRESS NOTE  Kimberly Castaneda QPY:[id number] DOB: 24-Nov-1940 DOA: 08/28/2017  PCP: [name], MD  Brief History/Interval Summary: 76 year old Caucasian female with a past medical history of chronic diastolic CHF, nonischemic cardiomyopathy, dyslipidemia, hypertension, type 2 diabetes, chronic leukocytosis, presented after an episode of near syncope. Patient was brought into the hospital the following day. Patient was found to have hypercalcemia. Leukocytosis. Patient also found to have DVT in the left lower extremity.  She was seen by oncology.  She underwent CT scan of the chest abdomen and pelvis which raised concern for uterine mass.  Subsequently patient developed acute renal failure.  Reason for Visit: near syncope  Consultants: Medical oncology.  Nephrology.  GYN oncology.  Procedures:  Left lower extremity venous Doppler Left - Positive for an acute DVT noted in the posterior tibial vein coursing through the popliteal, the confluence of the gastrocnemius with the popliteal. And to the mid femoral veins. No evidence of a superficial thrombosis or Baker's cyst. There is no propagation to the right.  Transthoracic echocardiogram Study Conclusions  - Left ventricle: The cavity size was normal. Wall thickness was   normal. Systolic function was normal. The estimated ejection   fraction was in the range of 55% to 60%. Wall motion was normal;   there were no regional wall motion abnormalities. Doppler   parameters are consistent with abnormal left ventricular   relaxation (grade 1 diastolic dysfunction). Impressions: - Normal LV systolic function; mild diastolic dysfunction.  Antibiotics: Unasyn was initiated on 10/18  Subjective/Interval History: Patient noted to be much more awake alert today.  However remains distracted.  Denies any pain.  Her husband is at the bedside.     ROS: Unable to do due to her confusion  Objective:  Vital Signs  Vitals:    08/25/17 1354 08/25/17 2055 08/26/17 0500 08/26/17 0534  BP: (!) 113/57 123/62  129/74  Pulse: 97 98  96  Resp: 16 18  18   Temp: 97.8 F (36.6 C) 98.7 F (37.1 C)  98.3 F (36.8 C)  TempSrc: Oral Oral  Oral  SpO2: 98% 98%  95%  Weight:   61.7 kg (136 lb 0.4 oz) 61.7 kg (136 lb 0.4 oz)  Height:        Intake/Output Summary (Last 24 hours) at 08/26/17 1121 Last data filed at 08/26/17 0600  Gross per 24 hour  Intake           946.33 ml  Output               75 ml  Net           871.33 ml   Filed Weights   08/24/17 0440 08/26/17 0500 08/26/17 0534  Weight: 60.2 kg (132 lb 11.5 oz) 61.7 kg (136 lb 0.4 oz) 61.7 kg (136 lb 0.4 oz)    General appearance: Awake alert.  In no distress.  Remains confused. Resp: Diminished air entry at the bases.  No wheezing.  No rhonchi.  Normal effort Cardio: S1-S2 is normal regular.  No S3-S4.  No rubs murmurs or bruit GI: Soft.  Mildly distended.  Continues to be tender in the lower abdomen without any rebound rigidity or guarding.  No masses or organomegaly.   Extremities: Left lower extremity remains larger than right. Neurologic: Patient is much more awake alert.  No focal neurological deficits.  Continues to be distracted and disoriented.     Lab Results:  Data Reviewed: I have personally reviewed following labs and  imaging studies  CBC:  Recent Labs Lab 08/22/2017 1038  08/22/17 0422 08/23/17 0457 08/24/17 0438 08/25/17 0727 08/26/17 0517  WBC 31.9*  < > 26.9* 25.2* 30.5* 36.6* 37.0*  NEUTROABS 29.0*  --  24.2*  --   --   --   --   HGB 9.6*  < > 8.0* 7.9* 7.0* 8.4* 8.6*  HCT 29.8*  < > 24.5* 24.6* 21.2* 24.9* 25.2*  MCV 87.6  < > 87.5 87.5 87.2 88.0 86.9  PLT 619*  < > 510* 529* 437* 439* 466*  < > = values in this interval not displayed.  Basic Metabolic Panel:  Recent Labs Lab 08/22/17 0422 08/23/17 0457 08/24/17 0438 08/25/17 0727 08/26/17 0517  NA 133* 134* 131* 131* 132*  K 4.1 3.6 3.4* 4.1 4.4  CL 100* 103 103  106 105  CO2 22 19* 18* 17* 15*  GLUCOSE 160* 202* 208* 142* 96  BUN 23* 17 21* 28* 34*  CREATININE 0.89 0.85 1.74* 3.04* 3.94*  CALCIUM 10.9* 9.7 8.3* 7.9* 7.8*    GFR: Estimated Creatinine Clearance: 10.7 mL/min (A) (by C-G formula based on SCr of 3.94 mg/dL (H)).  Liver Function Tests:  Recent Labs Lab 08/20/2017 1038 08/22/17 0422 08/25/17 0727  AST 17 13* 17  ALT 11* 10* 10*  ALKPHOS 135* 108 110  BILITOT 0.7 0.5 0.6  PROT 7.0 5.9* 4.9*  ALBUMIN 2.8* 2.3* 1.7*    Cardiac Enzymes:  Recent Labs Lab 08/11/2017 1038  CKTOTAL 52  TROPONINI <0.03    CBG:  Recent Labs Lab 08/25/17 0818 08/25/17 1147 08/25/17 1704 08/25/17 2053 08/26/17 0731  GLUCAP 132* 130* 95 90 102*     Radiology Studies: US Renal  Result Date: 08/25/2017 CLINICAL DATA:  Acute renal failure EXAM: RENAL / URINARY TRACT ULTRASOUND COMPLETE COMPARISON:  04/07/2013 FINDINGS: Right Kidney: Length: 9.8 cm.  No hydronephrosis.  Normal renal echogenicity. Left Kidney: Length: 11.0 cm.  No hydronephrosis.  Normal renal echogenicity. Bladder: Decompressed around a Foley catheter. IMPRESSION: Normal renal ultrasound for age. Electronically Signed   By: Abigail Miyamoto M.D.   On: 08/25/2017 17:44     Medications:  Scheduled: . atorvastatin  20 mg Oral Daily  . insulin aspart  0-5 Units Subcutaneous QHS  . insulin aspart  0-9 Units Subcutaneous TID WC  . omega-3 acid ethyl esters  1 g Oral Daily   Continuous: . sodium chloride 40 mL/hr at 08/26/17 0957  . sodium chloride    . ampicillin-sulbactam (UNASYN) IV Stopped (08/26/17 0602)  . heparin 900 Units/hr (08/26/17 0946)   OVF:IEPPIRJJOACZY **OR** acetaminophen, fluticasone, HYDROcodone-acetaminophen, magnesium citrate, ondansetron (ZOFRAN) IV  Assessment/Plan:  Active Problems:   CKD (chronic kidney disease) stage 3, GFR 30-59 ml/min (HCC)   Non-insulin dependent type 2 diabetes mellitus (HCC)   Dyslipidemia   Chronic diastolic CHF  (congestive heart failure), NYHA class 1 (HCC)   Syncope, vasovagal   Hypercalcemia   Leukocytosis   Anemia    Acute on chronic kidney disease stage II/non-anion gap metabolic acidosis Creatinine continues to rise however she has made some urine over the last 12 hours.  Appreciate nephrology input.  Urine studies are pending.  Ultrasound does not show any evidence for hydronephrosis.  She received contrast for the CT scan on 10/18.  She has been on Unasyn as well.  This is most likely contrast nephropathy.  Continue to monitor for now.  Bicarbonate noted to be low.  Could consider oral sodium bicarbonate.  However will  defer to nephrology.  Uterine Mass concerning for malignancy This was seen on CT scan of the abdomen and pelvis. Discussed with GYN oncology, Dr. Delsa Sale. She recommends endometrial biopsy. This has been arranged on 10/22 at 9:30 AM at the cancer center. If the patient is still in the hospital they will take her down to the cancer center to do this.  We will hold her heparin prior to the procedure.  The results of the CT scan were communicated to the patient's husband. These findings could be the reason for her abdominal tenderness. No other abnormalities noted.  Near syncope Etiology for this is not entirely clear, but could ave been due to dehydration. She was noted to be hypercalcemic at admission. Patient has been found to have a left lower extremity DVT. CT scan of the chest did not show any pulmonary embolism.   Acute Left lower extremity DVT. Detected on her extremity Doppler study.  She was initially placed on full dose Lovenox.  However due to worsening renal failure she has been transitioned to IV heparin.  We will change it to oral anticoagulation when she is closer to discharge.    Fever/aspiration pneumonia Patient became febrile on the night of 10/18. Her CT scan suggested infiltrates in the right lung. She could've aspirated. She was started on Unasyn.  She  remains afebrile.  WBC remains elevated.  Continue to monitor.  Leukocytosis. He has had elevated WBC on and off for the past year or so.  Reason for acute worsening is not entirely clear.  Could be related to hypovolemia, aspiration pneumonia.  LDH was normal at 153.  Patient has been seen by hematology and oncology.    Hypercalcemia. Most likely due to dehydration. Patient was hydrated, given a dose of Lasix and given calcitonin. Calcium level has improved. Intact PTH normal. TSH normal. PTH related peptide level is pending.  History of nonischemic cardiomyopathy/chronic diastolic dysfunction. Echocardiogram shows normal systolic function with grade 1 diastolic dysfunction.  Diabetes mellitus type 2. Continue sliding scale insulin. Monitor CBGs. HbA1c 6.4.  History of essential hypertension Blood pressure noted to be borderline low.  Beta-blockers were discontinued.  Blood pressure has improved.  Continue to monitor for now.   Normocytic anemia. Hemoglobin was noted to be 7 on 10/19.  Patient was transfused 1 unit of blood.  Hemoglobin has responded.  There has been no evidence for overt bleeding.  Stool for occult blood was negative.     DVT Prophylaxis: IV heparin Code Status: full code  Family Communication: Discussed with the patient's husband Disposition Plan: Management as outlined above    LOS: 4 days   [facility] Pager [phone] 08/26/2017, 11:21 AM  If 7PM-7AM, please contact night-coverage at [email or web address], password [facility]

## 2017-08-26 NOTE — Progress Notes (Addendum)
ANTICOAGULATION CONSULT NOTE - Follow up [place] for Lovenox --> IV heparin Indication: DVT  Allergies  Allergen Reactions  . Erythromycin Nausea And Vomiting  . Quinapril Other (See Comments)    unknown  . Ciprofloxacin Hcl Nausea Only    Patient Measurements: Height: 5\' 2"  (157.5 cm) Weight: 136 lb 0.4 oz (61.7 kg) IBW/kg (Calculated) : 50.1  Vital Signs: Temp: 98.3 F (36.8 C) (10/21 0534) Temp Source: Oral (10/21 0534) BP: 129/74 (10/21 0534) Pulse Rate: 96 (10/21 0534)  Labs:  Recent Labs  08/24/17 0438 08/25/17 0727 08/26/17 0517  HGB 7.0* 8.4* 8.6*  HCT 21.2* 24.9* 25.2*  PLT 437* 439* 466*  CREATININE 1.74* 3.04* 3.94*    Estimated Creatinine Clearance: 10.7 mL/min (A) (by C-G formula based on SCr of 3.94 mg/dL (H)).   Assessment: 76 year old female admitted with near syncope.  Doppler of LLE on 10/17 reveals an acute DVT.  Pharmacy is consulted to dose Lovenox.  Patient received a dose of 40mg  10/16 22:54, changed to full dose on 10/17 AM.  10/19-20: Acute rise in SCr and little UOP. Nephrology consulting, likely contrast nephropathy. 10/21: Switch from Lovenox to IV heparin. No oral anticoagulant yet per [facility].   SCr - cont to rise. CrCl 11 ml/min. UOP very low.  CBC ok.   No bleeding currently documented  Goal of Therapy:  Heparin level 0.3-0.7 units/ml Monitor platelets by anticoagulation protocol: Yes   Plan:   DC Lovenox. Last given at 2200. Start heparin infusion at 900 units/hr (no bolus). Check heparin level in 8hrs and daily thereafter. Check CBC q24h while on heparin.  F/u daily.  Romeo Rabon, PharmD. Mobile: [phone]. 08/26/2017,9:18 AM.  Spoke with Dr. Maryland Pink. With endometrial biopsy scheduled at the cancer center around 1000 tomorrow, he gave a verbal order to stop the heparin infusion 4-5 hours before.   Romeo Rabon, PharmD. Mobile: [phone]. 08/26/2017,11:25 AM.

## 2017-08-26 NOTE — Progress Notes (Signed)
[facility] Progress Note  Subjective: a bit more awake and alert today, still disoriented a bit.  No SOB or CP.   Vitals:   08/25/17 2055 08/26/17 0500 08/26/17 0534 08/26/17 1255  BP: 123/62  129/74 140/70  Pulse: 98  96 100  Resp: 18  18 (!) 21  Temp: 98.7 F (37.1 C)  98.3 F (36.8 C) 98.6 F (37 C)  TempSrc: Oral  Oral Oral  SpO2: 98%  95% 97%  Weight:  61.7 kg (136 lb 0.4 oz) 61.7 kg (136 lb 0.4 oz)   Height:        Inpatient medications: . atorvastatin  20 mg Oral Daily  . insulin aspart  0-5 Units Subcutaneous QHS  . insulin aspart  0-9 Units Subcutaneous TID WC  . omega-3 acid ethyl esters  1 g Oral Daily   . sodium chloride 40 mL/hr at 08/26/17 0957  . sodium chloride    . ampicillin-sulbactam (UNASYN) IV Stopped (08/26/17 0602)  . heparin 900 Units/hr (08/26/17 0946)   acetaminophen **OR** acetaminophen, fluticasone, HYDROcodone-acetaminophen, magnesium citrate, ondansetron (ZOFRAN) IV  Exam: Gen frail, no distress, some facial edema No rash, cyanosis or gangrene Sclera anicteric, throat clear  No jvd or bruits Chest clear bilat RRR no MRG Abd firm, distended, diffusely tender, +bs , no hsm or ascites GU defer MS no joint effusions or deformity Ext trace LE edema / no wounds or ulcers Neuro is alert, nonfocal, gen'd weak   UA > negative 10/16 Renal US > 10- 11 cm kidneys, no hydro, normal bladder    Impression: 1. Acute renal failure - baseline creat 0.9 on admission.   Foley in, urine clear but low UOP.  She has contrast nephropathy, creat still rising. Hopefully will turn around soon. Not grossly uremic. Will ^IVF to 75/ hr.  2. HTN - dc'd coreg due to soft BP's. Let BP come up.  3. Uterine mass - work-up pending 4. ^WBC - 30- 40k,may have BM disorder per ONC 5. LLE DVT - on IV heparin 6. AMS - multifactorial, improving 7. Hypercalcemia - on admit Ca was 10.6, treated with 48 hrs of calcitonin, Ca came down and is now around  7.8, corrects to 9.6.  Seen by ONC, r/o myelom testing is pending.  8. Anemia - sp transfusion, Hb up to 8.4   Plan - have d/w husband. Plan as above.   Kelly Splinter MD [facility] pager [phone]   08/26/2017, 2:23 PM    Recent Labs Lab 08/24/17 0438 08/25/17 0727 08/26/17 0517  NA 131* 131* 132*  K 3.4* 4.1 4.4  CL 103 106 105  CO2 18* 17* 15*  GLUCOSE 208* 142* 96  BUN 21* 28* 34*  CREATININE 1.74* 3.04* 3.94*  CALCIUM 8.3* 7.9* 7.8*    Recent Labs Lab 08/25/2017 1038 08/22/17 0422 08/25/17 0727  AST 17 13* 17  ALT 11* 10* 10*  ALKPHOS 135* 108 110  BILITOT 0.7 0.5 0.6  PROT 7.0 5.9* 4.9*  ALBUMIN 2.8* 2.3* 1.7*    Recent Labs Lab 08/31/2017 1038  08/22/17 0422  08/24/17 0438 08/25/17 0727 08/26/17 0517  WBC 31.9*  < > 26.9*  < > 30.5* 36.6* 37.0*  NEUTROABS 29.0*  --  24.2*  --   --   --   --   HGB 9.6*  < > 8.0*  < > 7.0* 8.4* 8.6*  HCT 29.8*  < > 24.5*  < > 21.2* 24.9* 25.2*  MCV 87.6  < >  87.5  < > 87.2 88.0 86.9  PLT 619*  < > 510*  < > 437* 439* 466*  < > = values in this interval not displayed. Iron/TIBC/Ferritin/ %Sat    Component Value Date/Time   IRON 41 (L) 11/24/2010 0619   TIBC 170 (L) 11/24/2010 0619   FERRITIN 605 (H) 11/24/2010 9371   IRONPCTSAT 24 11/24/2010 6967

## 2017-08-27 ENCOUNTER — Ambulatory Visit: Payer: Medicare Other | Admitting: Gynecologic Oncology

## 2017-08-27 ENCOUNTER — Other Ambulatory Visit: Payer: Self-pay | Admitting: Gynecologic Oncology

## 2017-08-27 DIAGNOSIS — L8990 Pressure ulcer of unspecified site, unspecified stage: Secondary | ICD-10-CM | POA: Insufficient documentation

## 2017-08-27 DIAGNOSIS — C539 Malignant neoplasm of cervix uteri, unspecified: Principal | ICD-10-CM

## 2017-08-27 DIAGNOSIS — N888 Other specified noninflammatory disorders of cervix uteri: Secondary | ICD-10-CM

## 2017-08-27 LAB — BASIC METABOLIC PANEL
Anion gap: 12 (ref 5–15)
BUN: 37 mg/dL — ABNORMAL HIGH (ref 6–20)
CHLORIDE: 105 mmol/L (ref 101–111)
CO2: 14 mmol/L — AB (ref 22–32)
CREATININE: 3.57 mg/dL — AB (ref 0.44–1.00)
Calcium: 7.5 mg/dL — ABNORMAL LOW (ref 8.9–10.3)
GFR calc Af Amer: 13 mL/min — ABNORMAL LOW (ref >60)
GFR calc non Af Amer: 12 mL/min — ABNORMAL LOW (ref >60)
Glucose, Bld: 118 mg/dL — ABNORMAL HIGH (ref 65–99)
Potassium: 4 mmol/L (ref 3.5–5.1)
Sodium: 131 mmol/L — ABNORMAL LOW (ref 135–145)

## 2017-08-27 LAB — HEPARIN LEVEL (UNFRACTIONATED)
Heparin Unfractionated: 0.33 IU/mL (ref 0.30–0.70)
Heparin Unfractionated: 0.23 IU/mL — ABNORMAL LOW (ref 0.30–0.70)

## 2017-08-27 LAB — CBC
HEMATOCRIT: 24.1 % — AB (ref 36.0–46.0)
HEMOGLOBIN: 8.3 g/dL — AB (ref 12.0–15.0)
MCH: 29.9 pg (ref 26.0–34.0)
MCHC: 34.4 g/dL (ref 30.0–36.0)
MCV: 86.7 fL (ref 78.0–100.0)
Platelets: 458 10*3/uL — ABNORMAL HIGH (ref 150–400)
RBC: 2.78 MIL/uL — ABNORMAL LOW (ref 3.87–5.11)
RDW: 15.8 % — ABNORMAL HIGH (ref 11.5–15.5)
WBC: 28.5 10*3/uL — ABNORMAL HIGH (ref 4.0–10.5)

## 2017-08-27 LAB — GLUCOSE, CAPILLARY
GLUCOSE-CAPILLARY: 125 mg/dL — AB (ref 65–99)
GLUCOSE-CAPILLARY: 110 mg/dL — AB (ref 65–99)
GLUCOSE-CAPILLARY: 143 mg/dL — AB (ref 65–99)
Glucose-Capillary: 138 mg/dL — ABNORMAL HIGH (ref 65–99)

## 2017-08-27 MED ORDER — HEPARIN (PORCINE) IN NACL 100-0.45 UNIT/ML-% IJ SOLN
900.0000 [IU]/h | INTRAMUSCULAR | Status: DC
  Administered 2017-08-27: 900 [IU]/h via INTRAVENOUS
  Filled 2017-08-27: qty 250

## 2017-08-27 MED ORDER — SODIUM BICARBONATE 8.4 % IV SOLN
INTRAVENOUS | Status: DC
  Administered 2017-08-27 – 2017-08-29 (×3): via INTRAVENOUS
  Filled 2017-08-27 (×7): qty 850

## 2017-08-27 MED ORDER — DEXTROSE 5 % IV SOLN
INTRAVENOUS | Status: DC
  Filled 2017-08-27: qty 1000

## 2017-08-27 NOTE — Progress Notes (Signed)
ANTICOAGULATION CONSULT NOTE - Follow up [place] for Lovenox --> IV heparin Indication: DVT  Allergies  Allergen Reactions  . Erythromycin Nausea And Vomiting  . Quinapril Other (See Comments)    unknown  . Ciprofloxacin Hcl Nausea Only    Patient Measurements: Height: 5\' 2"  (157.5 cm) Weight: 136 lb 11 oz (62 kg) IBW/kg (Calculated) : 50.1  Vital Signs: Temp: 98.5 F (36.9 C) (10/22 1255) Temp Source: Oral (10/22 1255) BP: 155/81 (10/22 1255) Pulse Rate: 101 (10/22 1255)  Labs:  Recent Labs  08/25/17 0727 08/26/17 0517 08/26/17 1740 08/27/17 0443 08/27/17 2236  HGB 8.4* 8.6*  --  8.3*  --   HCT 24.9* 25.2*  --  24.1*  --   PLT 439* 466*  --  458*  --   HEPARINUNFRC  --   --  0.48 0.33 0.23*  CREATININE 3.04* 3.94*  --  3.57*  --     Estimated Creatinine Clearance: 11.8 mL/min (A) (by C-G formula based on SCr of 3.57 mg/dL (H)).   Assessment: 76 year old female admitted with near syncope.  Doppler of LLE on 10/17 reveals an acute DVT. FOBT negative 10/18. Pharmacy initially consulted to dose Lovenox. Acute rise in SCr 10/19-10/20 and little UOP. Nephrology consulted, likely contrast nephropathy.Pharmacy asked to transition patient to IV heparin on 10/21.   Heparin level this AM @ 0443 = 0.33 units/mL on heparin infusion at 900 units/hr  Heparin infusion held this AM for biopsy.  Per Dr. Maryland Pink, ok to resume at 2pm today with no bolus.    SCr elevated, but now trending down.   CBC: Hgb low, but stable. Pltc elevated   No bleeding currently documented  22:36 Heparin level 0.23, subtherapeutic on 900 units/hr. No bleeding or problems with IV infusion site per discussion with RN.  Goal of Therapy:  Heparin level 0.3-0.7 units/ml Monitor platelets by anticoagulation protocol: Yes   Plan:  Increase heparin infusion to 1000 units/hr. Check heparin level 8 hours after heparin resumed. Daily CBC and heparin level while on heparin  infusion. Monitor for s/sx of bleeding.   Peggyann Juba, PharmD, BCPS Pager: [phone] 08/27/2017 12:33 PM

## 2017-08-27 NOTE — Progress Notes (Signed)
[facility] Progress Note  Subjective:  Creatinine starting to fall some BP up For biopsy uterine mass  Vitals:   08/26/17 1520 08/26/17 2100 08/27/17 0517 08/27/17 1255  BP: 137/74 (!) 157/75 (!) 148/67 (!) 155/81  Pulse: (!) 101 (!) 107 99 (!) 101  Resp: 20 20 18 20   Temp: 98.6 F (37 C) 98.1 F (36.7 C) 98.2 F (36.8 C) 98.5 F (36.9 C)  TempSrc: Oral Oral Oral Oral  SpO2: 97% 95% 97% 99%  Weight:   62 kg (136 lb 11 oz)   Height:       Exam: Gen frail, no distress Confused, thinks her dog just died No rash, cyanosis or gangrene No jvd  Chest clear bilat RRR no MRG Abd firm, distended, diffusely tender, +bs , no hsm or ascites Ext trace LE edema no change Generally week, somewhat confused No definite asterixis Clear urine foley  Inpatient medications: . atorvastatin  20 mg Oral Daily  . insulin aspart  0-5 Units Subcutaneous QHS  . insulin aspart  0-9 Units Subcutaneous TID WC  . omega-3 acid ethyl esters  1 g Oral Daily   . sodium chloride 75 mL/hr at 08/27/17 0621  . sodium chloride    . ampicillin-sulbactam (UNASYN) IV Stopped (08/27/17 5638)  . heparin     acetaminophen **OR** acetaminophen, fluticasone, HYDROcodone-acetaminophen, magnesium citrate, ondansetron (ZOFRAN) IV   Recent Labs Lab 08/25/17 0727 08/26/17 0517 08/27/17 0443  NA 131* 132* 131*  K 4.1 4.4 4.0  CL 106 105 105  CO2 17* 15* 14*  GLUCOSE 142* 96 118*  BUN 28* 34* 37*  CREATININE 3.04* 3.94* 3.57*  CALCIUM 7.9* 7.8* 7.5*    Recent Labs Lab 08/30/2017 1038 08/22/17 0422 08/25/17 0727  AST 17 13* 17  ALT 11* 10* 10*  ALKPHOS 135* 108 110  BILITOT 0.7 0.5 0.6  PROT 7.0 5.9* 4.9*  ALBUMIN 2.8* 2.3* 1.7*    Recent Labs Lab 08/17/2017 1038  08/22/17 0422  08/25/17 0727 08/26/17 0517 08/27/17 0443  WBC 31.9*  < > 26.9*  < > 36.6* 37.0* 28.5*  NEUTROABS 29.0*  --  24.2*  --   --   --   --   HGB 9.6*  < > 8.0*  < > 8.4* 8.6* 8.3*  HCT 29.8*  < > 24.5*  <  > 24.9* 25.2* 24.1*  MCV 87.6  < > 87.5  < > 88.0 86.9 86.7  PLT 619*  < > 510*  < > 439* 466* 458*  < > = values in this interval not displayed.   Iron/TIBC/Ferritin/ %Sat    Component Value Date/Time   IRON 41 (L) 11/24/2010 0619   TIBC 170 (L) 11/24/2010 0619   FERRITIN 605 (H) 11/24/2010 0619   IRONPCTSAT 24 11/24/2010 0619   UA > negative 10/16 Renal US > 10- 11 cm kidneys, no hydro, normal bladder   Background:  76 y.o. year-old with hx of HTN, NICM, HL, DM2, CKD 2/3 , presented to ED 08/15/2017 with fall at home and gen'd weakness. Poor appetite, ^WBC, anemia present.  Calcium high at 10.9.  ONC recommended abd CT ->large mass in the pelvis (rec'd contrast). Also DVT of LLE. Creatinine increased 0.8 to 1.74  To 3.04 . Renal was asked to see. On ramipril PTA, none here. BP's soft on admission   Impression/Recommendations: 1. Acute renal failure - baseline creat 0.9 on admission.   Foley in, urine clear, [place] no hydro. Presume contrast nephropathy/relative hypotension/ACE  on board PTA.  reatinine appears to have reached plateau and falling, but still low UOP.  Getting IVF at 37. 8L + balance. MAY be seeing some increase in UOP. Hopefully will turn around soon. Confused but unclear if this is 2/2 to her RF or not  2. Metabolic acidosis - change IVF to isotonic bicarb (and reduce rate to 50)  3. HTN - dc'd coreg due to soft BP's. BP up now.  4. Stage IIIB cervical cancer. Locally invasive. Biopsy done. Treatment options being considered. Prob cause of hypercalcemia. 5. Chronic leukocytosis 6. LLE DVT - on IV heparin 7. AMS - multifactorial, improving 8. Hypercalcemia - mild elev PTHrP. Cervical Ca.  9. Anemia - sp transfusion, Hb 8.3. Fe studies OK.   Jamal Maes, MD [facility] [phone] Pager 08/27/2017, 2:02 PM

## 2017-08-27 NOTE — Care Management Important Message (Signed)
Important Message  Patient Details  Name: Kimberly Castaneda MRN: [id number] Date of Birth: Jan 11, 1941   Medicare Important Message Given:  Yes    Kerin Salen 08/27/2017, 12:59 [place] Message  Patient Details  Name: Kimberly Castaneda MRN: [id number] Date of Birth: [birth year]   Medicare Important Message Given:  Yes    Kerin Salen 08/27/2017, 12:59 PM

## 2017-08-27 NOTE — Progress Notes (Signed)
[facility] PROGRESS NOTE  Kimberly Castaneda NKN:[id number] DOB: [birth year] DOA: 08/28/2017  PCP: [name], MD  Brief History/Interval Summary: 76 year old Caucasian female with a past medical history of chronic diastolic CHF, nonischemic cardiomyopathy, dyslipidemia, hypertension, type 2 diabetes, chronic leukocytosis, presented after an episode of near syncope. Patient was brought into the hospital the following day. Patient was found to have hypercalcemia. Leukocytosis. Patient also found to have DVT in the left lower extremity.  She was seen by oncology.  She underwent CT scan of the chest abdomen and pelvis which raised concern for uterine mass.  Subsequently patient developed acute renal failure to be due to contrast.  Nephrology was consulted.  Patient to undergo biopsy of the uterine mass on Monday, October 22.  Reason for Visit: near syncope  Consultants: Medical oncology.  Nephrology.  GYN oncology.  Procedures:  Left lower extremity venous Doppler Left - Positive for an acute DVT noted in the posterior tibial vein coursing through the popliteal, the confluence of the gastrocnemius with the popliteal. And to the mid femoral veins. No evidence of a superficial thrombosis or Baker's cyst. There is no propagation to the right.  Transthoracic echocardiogram Study Conclusions  - Left ventricle: The cavity size was normal. Wall thickness was   normal. Systolic function was normal. The estimated ejection   fraction was in the range of 55% to 60%. Wall motion was normal;   there were no regional wall motion abnormalities. Doppler   parameters are consistent with abnormal left ventricular   relaxation (grade 1 diastolic dysfunction). Impressions: - Normal LV systolic function; mild diastolic dysfunction.  Antibiotics: Unasyn was initiated on 10/18  Subjective/Interval History: Patient remains awake alert although distracted and mildly confused.  She denies any new  complaints.     ROS: Unable to do due to her confusion  Objective:  Vital Signs  Vitals:   08/26/17 1255 08/26/17 1520 08/26/17 2100 08/27/17 0517  BP: 140/70 137/74 (!) 157/75 (!) 148/67  Pulse: 100 (!) 101 (!) 107 99  Resp: (!) 21 20 20 18   Temp: 98.6 F (37 C) 98.6 F (37 C) 98.1 F (36.7 C) 98.2 F (36.8 C)  TempSrc: Oral Oral Oral Oral  SpO2: 97% 97% 95% 97%  Weight:    62 kg (136 lb 11 oz)  Height:        Intake/Output Summary (Last 24 hours) at 08/27/17 1020 Last data filed at 08/27/17 3790  Gross per 24 hour  Intake           1658.6 ml  Output              500 ml  Net           1158.6 ml   Filed Weights   08/26/17 0500 08/26/17 0534 08/27/17 0517  Weight: 61.7 kg (136 lb 0.4 oz) 61.7 kg (136 lb 0.4 oz) 62 kg (136 lb 11 oz)    General appearance: Awake alert.  In no distress.  Remains confused Resp: Diminished air entry at the bases.  No wheezing.  No rhonchi. Cardio: S1-S2 is normal regular.  No S3-S4.  No rubs murmurs or bruit GI: Abdomen remains soft.  Mildly distended.  Continues to be tender in the lower abdomen without any rebound rigidity or guarding.   Extremities: Left lower extremity remains larger than right. Neurologic: Patient remains awake and alert.  No focal neurological deficits.  Remains disoriented at times.    Lab Results:  Data Reviewed: I have personally  reviewed following labs and imaging studies  CBC:  Recent Labs Lab 08/30/2017 1038  08/22/17 0422 08/23/17 0457 08/24/17 0438 08/25/17 0727 08/26/17 0517 08/27/17 0443  WBC 31.9*  < > 26.9* 25.2* 30.5* 36.6* 37.0* 28.5*  NEUTROABS 29.0*  --  24.2*  --   --   --   --   --   HGB 9.6*  < > 8.0* 7.9* 7.0* 8.4* 8.6* 8.3*  HCT 29.8*  < > 24.5* 24.6* 21.2* 24.9* 25.2* 24.1*  MCV 87.6  < > 87.5 87.5 87.2 88.0 86.9 86.7  PLT 619*  < > 510* 529* 437* 439* 466* 458*  < > = values in this interval not displayed.  Basic Metabolic Panel:  Recent Labs Lab 08/23/17 0457 08/24/17 0438  08/25/17 0727 08/26/17 0517 08/27/17 0443  NA 134* 131* 131* 132* 131*  K 3.6 3.4* 4.1 4.4 4.0  CL 103 103 106 105 105  CO2 19* 18* 17* 15* 14*  GLUCOSE 202* 208* 142* 96 118*  BUN 17 21* 28* 34* 37*  CREATININE 0.85 1.74* 3.04* 3.94* 3.57*  CALCIUM 9.7 8.3* 7.9* 7.8* 7.5*    GFR: Estimated Creatinine Clearance: 11.8 mL/min (A) (by C-G formula based on SCr of 3.57 mg/dL (H)).  Liver Function Tests:  Recent Labs Lab 09/03/2017 1038 08/22/17 0422 08/25/17 0727  AST 17 13* 17  ALT 11* 10* 10*  ALKPHOS 135* 108 110  BILITOT 0.7 0.5 0.6  PROT 7.0 5.9* 4.9*  ALBUMIN 2.8* 2.3* 1.7*    Cardiac Enzymes:  Recent Labs Lab 08/17/2017 1038  CKTOTAL 52  TROPONINI <0.03    CBG:  Recent Labs Lab 08/26/17 0731 08/26/17 1143 08/26/17 1720 08/26/17 2100 08/27/17 0728  GLUCAP 102* 112* 123* 119* 125*     Radiology Studies: US Renal  Result Date: 08/25/2017 CLINICAL DATA:  Acute renal failure EXAM: RENAL / URINARY TRACT ULTRASOUND COMPLETE COMPARISON:  04/07/2013 FINDINGS: Right Kidney: Length: 9.8 cm.  No hydronephrosis.  Normal renal echogenicity. Left Kidney: Length: 11.0 cm.  No hydronephrosis.  Normal renal echogenicity. Bladder: Decompressed around a Foley catheter. IMPRESSION: Normal renal ultrasound for age. Electronically Signed   By: Abigail Miyamoto M.D.   On: 08/25/2017 17:44     Medications:  Scheduled: . atorvastatin  20 mg Oral Daily  . insulin aspart  0-5 Units Subcutaneous QHS  . insulin aspart  0-9 Units Subcutaneous TID WC  . omega-3 acid ethyl esters  1 g Oral Daily   Continuous: . sodium chloride 75 mL/hr at 08/27/17 0621  . sodium chloride    . ampicillin-sulbactam (UNASYN) IV Stopped (08/27/17 6045)   WUJ:[id number] **OR** acetaminophen, fluticasone, HYDROcodone-acetaminophen, magnesium citrate, ondansetron (ZOFRAN) IV  Assessment/Plan:  Active Problems:   CKD (chronic kidney disease) stage 3, GFR 30-59 ml/min (HCC)   Non-insulin dependent  type 2 diabetes mellitus (HCC)   Dyslipidemia   Chronic diastolic CHF (congestive heart failure), NYHA class 1 (HCC)   Syncope, vasovagal   Hypercalcemia   Leukocytosis   Anemia    Acute on chronic kidney disease stage II/non-anion gap metabolic acidosis Patient has made more urine over the last 24 hours than before.  Her creatinine is improved this morning.  Acute renal failure was most likely due to contrast.  Appreciate nephrology input.  Ultrasound of the renal system did not show any hydronephrosis.  UA reviewed.  Urine cultures ordered.  She received contrast for the CT scan on 10/18.  She has been on Unasyn as well. Bicarbonate means low.  Could consider oral sodium bicarbonate.  However will defer to nephrology.  Uterine Mass concerning for malignancy/likely reason for abdominal pain This was seen on CT scan of the abdomen and pelvis. Discussed with GYN oncology, Dr. Delsa Sale. She recommends endometrial biopsy. This has been arranged on 10/22 at 9:30 AM at the cancer center.  Patient will be transported to the cancer center for same this morning.  IV heparin has been held this morning.  The results of the CT scan were communicated to the patient's husband. These findings could be the reason for her abdominal tenderness. No other abnormalities noted.  Near syncope Etiology for this is not entirely clear, but could ave been due to dehydration. She was noted to be hypercalcemic at admission. Patient has been found to have a left lower extremity DVT. CT scan of the chest did not show any pulmonary embolism.  No recurrence since admission.  Acute Left lower extremity DVT. Detected on her extremity Doppler study.  She was initially placed on full dose Lovenox.  However due to worsening renal failure she has been transitioned to IV heparin.    Fever/aspiration pneumonia Patient became febrile on the night of 10/18. Her CT scan suggested infiltrates in the right lung. She could've  aspirated. She was started on Unasyn.  She remains afebrile.  WBC remains elevated but improved today.  Continue to monitor.  Leukocytosis. He has had elevated WBC on and off for the past year or so.  Reason for acute worsening is not entirely clear.  Could be related to hypovolemia, aspiration pneumonia.  LDH was normal at 153.  Patient has been seen by hematology and oncology.  WBC is better today.  Hypercalcemia. Most likely due to dehydration. Patient was hydrated, given a dose of Lasix and given calcitonin. Calcium level has improved. Intact PTH normal. TSH normal. PTH related peptide level is pending.  History of nonischemic cardiomyopathy/chronic diastolic dysfunction. Echocardiogram shows normal systolic function with grade 1 diastolic dysfunction.  Diabetes mellitus type 2. Continue sliding scale insulin. Monitor CBGs. HbA1c 6.4.  History of essential hypertension Blood pressure was noted to be borderline low.  Beta-blockers were discontinued.  Blood pressure has improved.  Continue to monitor for now.  Heart rate noted to be slightly elevated.  Consider resuming beta-blocker at a lower dose tomorrow.  Normocytic anemia. Hemoglobin was noted to be 7 on 10/19.  Patient was transfused 1 unit of blood.  There has been no evidence for overt bleeding.  Stool for occult blood was negative.     DVT Prophylaxis: IV heparin Code Status: full code  Family Communication: No family at bedside this morning Disposition Plan: Management as outlined above. Endometrial biopsy today.    LOS: 5 days   [facility] Pager [phone] 08/27/2017, 10:20 AM  If 7PM-7AM, please contact night-coverage at [email or web address], password [facility]

## 2017-08-27 NOTE — Progress Notes (Signed)
PT Cancellation Note  Patient Details Name: Kimberly Castaneda MRN: [id number] DOB: July 03, 1941   Cancelled Treatment:    Reason Eval/Treat Not Completed: Patient declined, siting "docto,doctor,doctor and I am just too weak". Claretha Cooper 08/27/2017, 2:46 PM Tresa Endo PT [phone]

## 2017-08-27 NOTE — Consult Note (Signed)
Consult Note: Gyn-Onc  Consult was requested by Dr. Maryland Castaneda for the evaluation of Kimberly Castaneda 76 y.o. female  CC: cervical and uterine mass on imaging, hypercalcemia   Assessment/Plan:  [name]  is a 76 y.o.  year old with apparent stage IIIB cervical cancer.  The tumor is very locally extensive with erosion of the cervix. It is palpable to the sidewalls bilaterally.  I recommend PET/CT to evaluate for distant sites of disease. If present, she would be a candidate for palliative pelvic RT followed by chemtherapy.  If PET/CT shows pelvic-confined (stage IIIB) disease, she would be a candidate for definitive chemoradiation.  Following results of the PET/CT we will facilitate consutlation with Dr's Kimberly Castaneda and Kimberly Castaneda.   Agree with plan to treat hypercalcemia. Dispo according to underlying medical status - this therapy does not need to commence as an inpatient.  HPI: Kimberly Castaneda is a 76 year old parous woman who is seen in consultation at the request of Dr Kimberly Castaneda for a pelvic mass on CT and hypercalcemia.   The patient lost consciousnessness at home on  08/23/2017 and was admitted to [facility] with profound dehydration from hypercalcemia. She also has thrombocytosis.  CT abdo/pelvis on 08/23/17 showed Enlarged abnormal appearing uterus with nodular areas of enhancement, areas of cystic change/necrosis and internal gas most likely representing a neoplasm, question cervical cancer versus endometrial cancer. Diffuse stranding of surrounding fat planes cannot exclude tumor extension, with suspected small parametrial nodule on RIGHT 5 mm diameter, LEFT ureteral nodule/ metastasis 4 mm diameter, and involvement of the distal sigmoid colon. No distant metastases.  She was also diagnosed with a DVT and started on Heparin.  Current Meds:  Current Facility-Administered Medications:  .  0.45 % sodium chloride infusion, , Intravenous, Continuous, Roney Jaffe, MD,  Last Rate: 75 mL/hr at 08/27/17 0175 .  0.9 %  sodium chloride infusion, , Intravenous, Once, Bonnielee Haff, MD .  acetaminophen (TYLENOL) tablet 650 mg, 650 mg, Oral, Q6H PRN **OR** acetaminophen (TYLENOL) suppository 650 mg, 650 mg, Rectal, Q6H PRN, Thurnell Lose, MD, 650 mg at 08/24/17 1726 .  ampicillin-sulbactam (UNASYN) 1.5 g in sodium chloride 0.9 % 50 mL IVPB, 1.5 g, Intravenous, Q24H, Bonnielee Haff, MD, Stopped at 08/27/17 [phone] .  atorvastatin (LIPITOR) tablet 20 mg, 20 mg, Oral, Daily, Thurnell Lose, MD, 20 mg at 08/26/17 0941 .  fluticasone (FLONASE) 50 MCG/ACT nasal spray 1-2 spray, 1-2 spray, Each Nare, Daily PRN, Thurnell Lose, MD .  HYDROcodone-acetaminophen (NORCO/VICODIN) 5-325 MG per tablet 1-2 tablet, 1-2 tablet, Oral, Q4H PRN, Schorr, Rhetta Mura, NP, 2 tablet at 08/25/17 [phone] .  insulin aspart (novoLOG) injection 0-5 Units, 0-5 Units, Subcutaneous, QHS, Thurnell Lose, MD, 2 Units at 08/23/17 2200 .  insulin aspart (novoLOG) injection 0-9 Units, 0-9 Units, Subcutaneous, TID WC, Thurnell Lose, MD, 1 Units at 08/27/17 0750 .  magnesium citrate solution 1 Bottle, 1 Bottle, Oral, Once PRN, Lala Lund K, MD .  omega-3 acid ethyl esters (LOVAZA) capsule 1 g, 1 g, Oral, Daily, Thurnell Lose, MD, 1 g at 08/26/17 0941 .  ondansetron (ZOFRAN) injection 4 mg, 4 mg, Intravenous, Q6H PRN, Thurnell Lose, MD  Allergy:  Allergies  Allergen Reactions  . Erythromycin Nausea And Vomiting  . Quinapril Other (See Comments)    unknown  . Ciprofloxacin Hcl Nausea Only    Social Hx:   Social History   Social History  . Marital status: Married  Spouse name: N/A  . Number of children: N/A  . Years of education: N/A   Occupational History  . Not on file.   Social History Main Topics  . Smoking status: Never Smoker  . Smokeless tobacco: Never Used  . Alcohol use No  . Drug use: No  . Sexual activity: Not on file   Other Topics Concern  . Not on  file   Social History Narrative  . No narrative on file    Past Surgical Hx:  Past Surgical History:  Procedure Laterality Date  . CARDIAC CATHETERIZATION  07/18/2010   r/t nonsichemic cardiomyopathy; normal coronary anatomy; EF 20%;   . TONSILLECTOMY    . TUBAL LIGATION    . US ECHOCARDIOGRAPHY  04/03/2012   EF 55%; mild LVH    Past Medical Hx:  Past Medical History:  Diagnosis Date  . CKD (chronic kidney disease) stage 3, GFR 30-59 ml/min (HCC)   . Diabetes mellitus ([place])    type 2  . Hypercholesterolemia   . Mitral regurgitation   . Nonischemic cardiomyopathy ([place])   . Systemic hypertension   . Vertigo     Past Gynecological History:  SVD x 2, tubal ligation. Denies having pap for a very long time. No LMP recorded. Patient is postmenopausal.  Family Hx: History reviewed. No pertinent family history.  Review of Systems:  Constitutional  Feels fatigued, listless. Palor present. ENT Normal appearing ears and nares bilaterally Skin/Breast  No rash, sores, jaundice, itching, dryness Cardiovascular  + SOB Pulmonary  No cough or wheeze.  Gastro Intestinal  No nausea, vomitting, or diarrhoea. No bright red blood per rectum, no abdominal pain, change in bowel movement, or constipation.  Genito Urinary  No frequency, urgency, dysuria, denies bleeding or discharge Musculo Skeletal  No myalgia, arthralgia, joint swelling or pain  Neurologic  + altered mental status- somnolence Psychology  No depression, anxiety, insomnia.   Vitals:  Blood pressure (!) 148/67, pulse 99, temperature 98.2 F (36.8 C), temperature source Oral, resp. rate 18, height 5\' 2"  (1.575 m), weight 136 lb 11 oz (62 kg), SpO2 97 %.  Physical Exam: WD in NAD Neck  Supple NROM, without any enlargements.  Lymph Node Survey No cervical supraclavicular or inguinal adenopathy Cardiovascular  Pulse normal rate, regularity and rhythm. S1 and S2 normal.  Lungs  Clear to auscultation bilateraly,  without wheezes/crackles/rhonchi. Good air movement.  Skin  No rash/lesions/breakdown  Psychiatry  Alert and oriented to person, place, and time  Abdomen  Normoactive bowel sounds, abdomen soft, non-tender and nonobese without evidence of hernia.  Back No CVA tenderness Genito Urinary  Vulva/vagina: Normal external female genitalia.   No lesions. No discharge or bleeding.  Bladder/urethra:  No lesions or masses, well supported bladder  Vagina: normal  Cervix: replaced by cavitating tumor which extends bilaterally to the side walls, >10cm  Uterus: indistinguishable from the cervical mass, bulky, enlarged, immobile  Adnexa: no discrete masses. Rectal  Good tone, no masses no cul de sac nodularity. No apparent involvement through extension through rectal mucosa Extremities  No bilateral cyanosis, clubbing or edema.  PROCEDURE NOTE:  Verbal consent obtained Time out performed. kevorkian forcep used to take a biopsy of the posterior lip of the necrotic cervical tumor. Hemostasis achieved with monsels. EBL: 19ER Complications: none Specimen: cervical biopsy for permanent histology   Donaciano Eva, MD  08/27/2017, 10:57 AM

## 2017-08-27 NOTE — Progress Notes (Signed)
ANTICOAGULATION CONSULT NOTE - Follow up [place] for Lovenox --> IV heparin Indication: DVT  Allergies  Allergen Reactions  . Erythromycin Nausea And Vomiting  . Quinapril Other (See Comments)    unknown  . Ciprofloxacin Hcl Nausea Only    Patient Measurements: Height: 5\' 2"  (157.5 cm) Weight: 136 lb 11 oz (62 kg) IBW/kg (Calculated) : 50.1  Vital Signs: Temp: 98.5 F (36.9 C) (10/22 0955) Temp Source: Oral (10/22 0517) BP: 152/85 (10/22 0955) Pulse Rate: 118 (10/22 0955)  Labs:  Recent Labs  08/25/17 0727 08/26/17 0517 08/26/17 1740 08/27/17 0443  HGB 8.4* 8.6*  --  8.3*  HCT 24.9* 25.2*  --  24.1*  PLT 439* 466*  --  458*  HEPARINUNFRC  --   --  0.48 0.33  CREATININE 3.04* 3.94*  --  3.57*    Estimated Creatinine Clearance: 11.8 mL/min (A) (by C-G formula based on SCr of 3.57 mg/dL (H)).   Assessment: 76 year old female admitted with near syncope.  Doppler of LLE on 10/17 reveals an acute DVT. FOBT negative 10/18. Pharmacy initially consulted to dose Lovenox. Acute rise in SCr 10/19-10/20 and little UOP. Nephrology consulted, likely contrast nephropathy.Pharmacy asked to transition patient to IV heparin on 10/21.   Heparin level this AM @ 0443 = 0.33 units/mL on heparin infusion at 900 units/hr  Heparin infusion held this AM for biopsy.  Per Dr. Maryland Pink, ok to resume at 2pm today with no bolus.    SCr elevated, but now trending down.   CBC: Hgb low, but stable. Pltc elevated   No bleeding currently documented  Goal of Therapy:  Heparin level 0.3-0.7 units/ml Monitor platelets by anticoagulation protocol: Yes   Plan:  Resume heparin infusion at 900 units/hr at 2pm today. Check heparin level 8 hours after heparin resumed. Daily CBC and heparin level while on heparin infusion. Monitor for s/sx of bleeding.   Lindell Spar, PharmD, BCPS Pager: [phone] 08/27/2017 12:33 PM

## 2017-08-28 LAB — BASIC METABOLIC PANEL
ANION GAP: 10 (ref 5–15)
BUN: 37 mg/dL — ABNORMAL HIGH (ref 6–20)
CALCIUM: 7.4 mg/dL — AB (ref 8.9–10.3)
CO2: 17 mmol/L — AB (ref 22–32)
Chloride: 107 mmol/L (ref 101–111)
Creatinine, Ser: 2.56 mg/dL — ABNORMAL HIGH (ref 0.44–1.00)
GFR calc Af Amer: 20 mL/min — ABNORMAL LOW (ref >60)
GFR calc non Af Amer: 17 mL/min — ABNORMAL LOW (ref >60)
GLUCOSE: 126 mg/dL — AB (ref 65–99)
Potassium: 3.5 mmol/L (ref 3.5–5.1)
Sodium: 134 mmol/L — ABNORMAL LOW (ref 135–145)

## 2017-08-28 LAB — RENAL FUNCTION PANEL
Albumin: 1.6 g/dL — ABNORMAL LOW (ref 3.5–5.0)
Anion gap: 10 (ref 5–15)
BUN: 37 mg/dL — AB (ref 6–20)
CHLORIDE: 107 mmol/L (ref 101–111)
CO2: 17 mmol/L — ABNORMAL LOW (ref 22–32)
Calcium: 7.4 mg/dL — ABNORMAL LOW (ref 8.9–10.3)
Creatinine, Ser: 2.57 mg/dL — ABNORMAL HIGH (ref 0.44–1.00)
GFR calc Af Amer: 20 mL/min — ABNORMAL LOW (ref >60)
GFR, EST NON AFRICAN AMERICAN: 17 mL/min — AB (ref >60)
Glucose, Bld: 127 mg/dL — ABNORMAL HIGH (ref 65–99)
POTASSIUM: 3.5 mmol/L (ref 3.5–5.1)
Phosphorus: 2.6 mg/dL (ref 2.5–4.6)
Sodium: 134 mmol/L — ABNORMAL LOW (ref 135–145)

## 2017-08-28 LAB — URINE CULTURE: Culture: NO GROWTH

## 2017-08-28 LAB — GLUCOSE, CAPILLARY
GLUCOSE-CAPILLARY: 129 mg/dL — AB (ref 65–99)
Glucose-Capillary: 120 mg/dL — ABNORMAL HIGH (ref 65–99)
Glucose-Capillary: 157 mg/dL — ABNORMAL HIGH (ref 65–99)
Glucose-Capillary: 124 mg/dL — ABNORMAL HIGH (ref 65–99)

## 2017-08-28 LAB — CBC
HEMATOCRIT: 23.5 % — AB (ref 36.0–46.0)
Hemoglobin: 7.8 g/dL — ABNORMAL LOW (ref 12.0–15.0)
MCH: 28.7 pg (ref 26.0–34.0)
MCHC: 33.2 g/dL (ref 30.0–36.0)
MCV: 86.4 fL (ref 78.0–100.0)
Platelets: 472 10*3/uL — ABNORMAL HIGH (ref 150–400)
RBC: 2.72 MIL/uL — ABNORMAL LOW (ref 3.87–5.11)
RDW: 15.7 % — AB (ref 11.5–15.5)
WBC: 22.9 10*3/uL — AB (ref 4.0–10.5)

## 2017-08-28 LAB — HEPARIN LEVEL (UNFRACTIONATED)
HEPARIN UNFRACTIONATED: 0.26 [IU]/mL — AB (ref 0.30–0.70)
Heparin Unfractionated: 0.37 IU/mL (ref 0.30–0.70)

## 2017-08-28 MED ORDER — CARVEDILOL 3.125 MG PO TABS
3.1250 mg | ORAL_TABLET | Freq: Two times a day (BID) | ORAL | Status: DC
  Administered 2017-08-28 – 2017-08-29 (×4): 3.125 mg via ORAL
  Filled 2017-08-28 (×4): qty 1

## 2017-08-28 MED ORDER — HEPARIN (PORCINE) IN NACL 100-0.45 UNIT/ML-% IJ SOLN: 1000.0000 [IU]/h | INTRAMUSCULAR | Status: DC

## 2017-08-28 MED ORDER — AMOXICILLIN-POT CLAVULANATE 500-125 MG PO TABS
1.0000 | ORAL_TABLET | Freq: Two times a day (BID) | ORAL | Status: DC
  Administered 2017-08-28 – 2017-08-29 (×2): 500 mg via ORAL
  Filled 2017-08-28 (×4): qty 1

## 2017-08-28 MED ORDER — HEPARIN (PORCINE) IN NACL 100-0.45 UNIT/ML-% IJ SOLN
1050.0000 [IU]/h | INTRAMUSCULAR | Status: DC
  Administered 2017-08-28 – 2017-08-29 (×3): 1050 [IU]/h via INTRAVENOUS
  Filled 2017-08-28 (×2): qty 250

## 2017-08-28 NOTE — Progress Notes (Signed)
ANTICOAGULATION CONSULT NOTE - Follow up [place] for Lovenox --> IV heparin Indication: DVT  Allergies  Allergen Reactions  . Erythromycin Nausea And Vomiting  . Quinapril Other (See Comments)    unknown  . Ciprofloxacin Hcl Nausea Only    Patient Measurements: Height: 5\' 2"  (157.5 cm) Weight: 138 lb 1.6 oz (62.6 kg) IBW/kg (Calculated) : 50.1  Vital Signs: Temp: 98.7 F (37.1 C) (10/23 1332) Temp Source: Oral (10/23 1332) BP: 122/70 (10/23 1641) Pulse Rate: 100 (10/23 1641)  Labs:  Recent Labs  08/26/17 0517  08/27/17 0443 08/27/17 2236 08/28/17 0401 08/28/17 0723 08/28/17 1904  HGB 8.6*  --  8.3*  --  7.8*  --   --   HCT 25.2*  --  24.1*  --  23.5*  --   --   PLT 466*  --  458*  --  472*  --   --   HEPARINUNFRC  --   < > 0.33 0.23*  --  0.26* 0.37  CREATININE 3.94*  --  3.57*  --  2.56*  2.57*  --   --   < > = values in this interval not displayed.  Estimated Creatinine Clearance: 16.5 mL/min (A) (by C-G formula based on SCr of 2.56 mg/dL (H)).   Assessment: 76 year old female admitted with near syncope.  Doppler of LLE on 10/17 reveals an acute DVT. FOBT negative 10/18. Pharmacy initially consulted to dose Lovenox. Acute rise in SCr 10/19-10/20 and little UOP. Nephrology consulted, likely contrast nephropathy.Pharmacy asked to transition patient to IV heparin on 10/21. Heparin held for biopsy 10/22 and resumed afterwards.    Heparin level this AM @ 0723 = 0.26 units/mL on heparin infusion at 1000 units/hr- slightly below goal of 0.3 - 0.7, infusing OK per RN  PM HL is 0.37, no bleeding issues or line issues per RN   SCr elevated, but now trending down.   CBC: Hgb low 7.8 . Pltc elevated   No bleeding currently documented  Goal of Therapy:  Heparin level 0.3-0.7 units/ml Monitor platelets by anticoagulation protocol: Yes   Plan:  Continue heparin infusion at 1050 units/hr. Check heparin level 8 hours after heparin resumed. Daily  CBC and heparin level while on heparin infusion. Monitor for s/sx of bleeding.  Royetta Asal, PharmD, BCPS Pager [phone] 08/28/2017 8:40 PM

## 2017-08-28 NOTE — Progress Notes (Signed)
[facility] PROGRESS NOTE  Kimberly Castaneda XTG:[id number] DOB: Jun 28, 1941 DOA: 08/16/2017  PCP: [name], MD  Brief History/Interval Summary: 76 year old Caucasian female with a past medical history of chronic diastolic CHF, nonischemic cardiomyopathy, dyslipidemia, hypertension, type 2 diabetes, chronic leukocytosis, presented after an episode of near syncope. Patient was brought into the hospital the following day. Patient was found to have hypercalcemia. Leukocytosis. Patient also found to have DVT in the left lower extremity.  She was seen by oncology.  She underwent CT scan of the chest abdomen and pelvis which raised concern for uterine mass.  Subsequently patient developed acute renal failure to be due to contrast.  Nephrology was consulted.  Patient underwent biopsy of the uterine/cervical mass on Monday, October 22.  Reason for Visit: near syncope  Consultants: Medical oncology.  Nephrology.  GYN oncology.  Procedures:  Left lower extremity venous Doppler Left - Positive for an acute DVT noted in the posterior tibial vein coursing through the popliteal, the confluence of the gastrocnemius with the popliteal. And to the mid femoral veins. No evidence of a superficial thrombosis or Baker's cyst. There is no propagation to the right.  Transthoracic echocardiogram Study Conclusions  - Left ventricle: The cavity size was normal. Wall thickness was   normal. Systolic function was normal. The estimated ejection   fraction was in the range of 55% to 60%. Wall motion was normal;   there were no regional wall motion abnormalities. Doppler   parameters are consistent with abnormal left ventricular   relaxation (grade 1 diastolic dysfunction). Impressions: - Normal LV systolic function; mild diastolic dysfunction.  Antibiotics: Unasyn was initiated on 10/18 Transition to oral Augmentin today.  Subjective/Interval History: Patient remains awake and alert.  She remains  distracted.  Denies any complaints.     ROS: Unable to do due to her confusion  Objective:  Vital Signs  Vitals:   08/26/17 2100 08/27/17 0517 08/27/17 1255 08/28/17 0518  BP: (!) 157/75 (!) 148/67 (!) 155/81 132/65  Pulse: (!) 107 99 (!) 101 100  Resp: 20 18 20 20   Temp: 98.1 F (36.7 C) 98.2 F (36.8 C) 98.5 F (36.9 C) 98.5 F (36.9 C)  TempSrc: Oral Oral Oral Oral  SpO2: 95% 97% 99% 98%  Weight:  62 kg (136 lb 11 oz)  62.6 kg (138 lb 1.6 oz)  Height:        Intake/Output Summary (Last 24 hours) at 08/28/17 0756 Last data filed at 08/28/17 0519  Gross per 24 hour  Intake           845.67 ml  Output             1500 ml  Net          -654.33 ml   Filed Weights   08/26/17 0534 08/27/17 0517 08/28/17 0518  Weight: 61.7 kg (136 lb 0.4 oz) 62 kg (136 lb 11 oz) 62.6 kg (138 lb 1.6 oz)    General appearance: Awake alert.  In no distress.  Confused. Resp: Clear to auscultation bilaterally.  No wheezing rales or rhonchi. Cardio: S1-S2 is normal regular.  No S3-S4.  No rubs murmurs or bruit GI: Abdomen remains soft mildly distended.  Tender in the lower abdomen without any rebound rigidity or guarding. Extremities: Left lower extremity remains larger than right. Neurologic: Patient remains awake and alert.  No focal neurological deficits.  Remains disoriented at times.    Lab Results:  Data Reviewed: I have personally reviewed following labs and  imaging studies  CBC:  Recent Labs Lab 08/27/2017 1038  08/22/17 0422  08/24/17 0438 08/25/17 0727 08/26/17 0517 08/27/17 0443 08/28/17 0401  WBC 31.9*  < > 26.9*  < > 30.5* 36.6* 37.0* 28.5* 22.9*  NEUTROABS 29.0*  --  24.2*  --   --   --   --   --   --   HGB 9.6*  < > 8.0*  < > 7.0* 8.4* 8.6* 8.3* 7.8*  HCT 29.8*  < > 24.5*  < > 21.2* 24.9* 25.2* 24.1* 23.5*  MCV 87.6  < > 87.5  < > 87.2 88.0 86.9 86.7 86.4  PLT 619*  < > 510*  < > 437* 439* 466* 458* 472*  < > = values in this interval not displayed.  Basic Metabolic  Panel:  Recent Labs Lab 08/24/17 0438 08/25/17 0727 08/26/17 0517 08/27/17 0443 08/28/17 0401  NA 131* 131* 132* 131* 134*  134*  K 3.4* 4.1 4.4 4.0 3.5  3.5  CL 103 106 105 105 107  107  CO2 18* 17* 15* 14* 17*  17*  GLUCOSE 208* 142* 96 118* 126*  127*  BUN 21* 28* 34* 37* 37*  37*  CREATININE 1.74* 3.04* 3.94* 3.57* 2.56*  2.57*  CALCIUM 8.3* 7.9* 7.8* 7.5* 7.4*  7.4*  PHOS  --   --   --   --  2.6    GFR: Estimated Creatinine Clearance: 16.5 mL/min (A) (by C-G formula based on SCr of 2.56 mg/dL (H)).  Liver Function Tests:  Recent Labs Lab 08/19/2017 1038 08/22/17 0422 08/25/17 0727 08/28/17 0401  AST 17 13* 17  --   ALT 11* 10* 10*  --   ALKPHOS 135* 108 110  --   BILITOT 0.7 0.5 0.6  --   PROT 7.0 5.9* 4.9*  --   ALBUMIN 2.8* 2.3* 1.7* 1.6*    Cardiac Enzymes:  Recent Labs Lab 08/06/2017 1038  CKTOTAL 52  TROPONINI <0.03    CBG:  Recent Labs Lab 08/27/17 0728 08/27/17 1143 08/27/17 1627 08/27/17 2151 08/28/17 0720  GLUCAP 125* 138* 110* 143* 129*     Radiology Studies: No results found.   Medications:  Scheduled: . atorvastatin  20 mg Oral Daily  . insulin aspart  0-5 Units Subcutaneous QHS  . insulin aspart  0-9 Units Subcutaneous TID WC  . omega-3 acid ethyl esters  1 g Oral Daily   Continuous: . sodium chloride    . ampicillin-sulbactam (UNASYN) IV 1.5 g (08/28/17 0625)  . dextrose 5 % 850 mL with sodium bicarbonate 150 mEq infusion 50 mL/hr at 08/27/17 1539  . heparin 1,000 Units/hr (08/28/17 0002)   RWE:[id number] **OR** acetaminophen, fluticasone, HYDROcodone-acetaminophen, magnesium citrate, ondansetron (ZOFRAN) IV  Assessment/Plan:  Active Problems:   CKD (chronic kidney disease) stage 3, GFR 30-59 ml/min (HCC)   Non-insulin dependent type 2 diabetes mellitus (HCC)   Dyslipidemia   Chronic diastolic CHF (congestive heart failure), NYHA class 1 (HCC)   Syncope, vasovagal   Hypercalcemia   Leukocytosis    Anemia   Pressure injury of skin   Cervical mass    Acute on chronic kidney disease stage II/non-anion gap metabolic acidosis Urine output has picked up.  Creatinine is improving.  Acute renal failure was most likely due to contrast.  Appreciate nephrology input.  Ultrasound of the renal system did not show any hydronephrosis.  UA reviewed.  Urine cultures ordered.  She received contrast for the CT scan on 10/18.  She  has been on Unasyn as well. Bicarbonate has improved as well.  Uterine/cervical mass concerning for malignancy/likely reason for abdominal pain This was seen on CT scan of the abdomen and pelvis. Discussed with GYN oncology.  Patient underwent biopsy of this lesion on 10/22 by Dr. Denman George.  Discussed with Dr. Denman George yesterday.  Recommends PET/CT scan.  This will be pursued as an outpatient.  Patient not a candidate for surgery at this time.  Discussed with Dr. Lebron Conners with medical oncology who recommends follow-up with Dr. Alvy Bimler who manages GYN oncological issues.  He will arrange this.  If patient does not progress during this hospital stay or if she gets worse he can come by and talk to the patient and family.  Near syncope Etiology for this is not entirely clear, but could have been due to dehydration. She was noted to be hypercalcemic at admission. Patient has been found to have a left lower extremity DVT. CT scan of the chest did not show any pulmonary embolism.  No recurrence since admission.  Acute Left lower extremity DVT. Detected on her extremity Doppler study.  She was initially placed on full dose Lovenox.  However due to worsening renal failure she has been transitioned to IV heparin.  Renal function is improving.  If it continues to improve she can be transitioned to apixaban.  Aspiration pneumonia Patient became febrile on the night of 10/18. Her CT scan suggested infiltrates in the right lung. She could've aspirated. She was started on Unasyn. She appears to have  improved.  Transition to Augmentin.    Leukocytosis. He has had elevated WBC on and off for the past year or so.  Reason for acute worsening is not entirely clear.  Could be related to hypovolemia, aspiration pneumonia.  LDH was normal at 153.  Patient has been seen by hematology and oncology.  WBC has been improving.  Hypercalcemia. Most likely due to dehydration. Patient was hydrated, given a dose of Lasix and given calcitonin. Calcium level has improved. Intact PTH normal. TSH normal. PTH related peptide level is 2.1.  History of nonischemic cardiomyopathy/chronic diastolic dysfunction. Echocardiogram shows normal systolic function with grade 1 diastolic dysfunction.  Diabetes mellitus type 2. Continue sliding scale insulin. Monitor CBGs. HbA1c 6.4.  History of essential hypertension Blood pressure was noted to be borderline low.  Beta-blockers were discontinued.  Blood pressure has improved.  Continue to monitor for now.  Heart rate noted to be slightly elevated.  Resume carvedilol at a lower dose.    Normocytic anemia. Hemoglobin was noted to be 7 on 10/19.  Patient was transfused 1 unit of blood.  There has been no evidence for overt bleeding.  Stool for occult blood was negative.  Hemoglobin noted to be slightly lower today.  Continue to monitor for now.  Transfuse if it drops below 7.   DVT Prophylaxis: IV heparin Code Status: full code  Family Communication: No family at bedside this morning Disposition Plan: Management as outlined above. Endometrial biopsy today.    LOS: 6 days   [facility] Pager [phone] 08/28/2017, 7:56 AM  If 7PM-7AM, please contact night-coverage at [email or web address], password [email or web address]

## 2017-08-28 NOTE — Progress Notes (Signed)
Physical Therapy Treatment Patient Details Name: Kimberly Castaneda MRN: [id number] DOB: February 09, 1941 Today's Date: 08/28/2017    History of Present Illness 76yo female visit to the Hospital after presentation with near syncope, weakness, immobility due to multiple fall. Admission labs significant for severe hypercalcemia as well as leukocytosis. CT of abdomen presents with question cervical cancer versus    PT Comments    Spouse "[name]" present during session.  Pt c/o MAX fatigue and overall feels "bad".  Assisted to EOB pt c/o mad dizziness.  BP 118/72.  Assisted with standing + 2 assist however unable to stand long enough for a standing BP.  Pt was only able to take a few steps to recliner.  This activity completed exhausted her. Positioned in recliner to comfort.     Follow Up Recommendations  SNF     Equipment Recommendations  None recommended by PT    Recommendations for Other Services       Precautions / Restrictions Precautions Precautions: Fall Precaution Comments: monitor orthostatics Restrictions Weight Bearing Restrictions: No    Mobility  Bed Mobility Overal bed mobility: Needs Assistance Bed Mobility: Supine to Sit     Supine to sit: Max assist;+2 for physical assistance;+2 for safety/equipment;HOB elevated     General bed mobility comments: HOB elevated and MAX encouragement as pt feels "bad" and "weak".  Required assist to maintain upright sitting balance EOB only 3 min with Mod c/o dizziness.  sitting BP 118/72  Transfers Overall transfer level: Needs assistance Equipment used: None Transfers: Stand Pivot Transfers   Stand pivot transfers: +2 physical assistance;Max assist       General transfer comment: too weak to stand long enough for a standing BP.  + 2 assist 1/4 turn from bed to recliner with 3 or 4 small steps to complete.    Ambulation/Gait             General Gait Details: unable, too weak   Network engineer Rankin (Stroke Patients Only)       Balance                                            Cognition Arousal/Alertness: Awake/alert (fatigued) Behavior During Therapy: WFL for tasks assessed/performed Overall Cognitive Status: Within Functional Limits for tasks assessed                                 General Comments: pt AxOx3 following all commands  "feels bad"      Exercises      General Comments        Pertinent Vitals/Pain Pain Assessment: Faces Faces Pain Scale: Hurts a little bit Pain Location: "all over"  Pain Descriptors / Indicators: Discomfort;Grimacing    Home Living                      Prior Function            PT Goals (current goals can now be found in the care plan section)      Frequency    Min 3X/week      PT Plan Current plan remains appropriate    Co-evaluation  AM-PAC PT "6 Clicks" Daily Activity  Outcome Measure  Difficulty turning over in bed (including adjusting bedclothes, sheets and blankets)?: Unable Difficulty moving from lying on back to sitting on the side of the bed? : Unable Difficulty sitting down on and standing up from a chair with arms (e.g., wheelchair, bedside commode, etc,.)?: Unable Help needed moving to and from a bed to chair (including a wheelchair)?: Total Help needed walking in hospital room?: Total Help needed climbing 3-5 steps with a railing? : Total 6 Click Score: 6    End of Session Equipment Utilized During Treatment: Gait belt Activity Tolerance: Patient limited by fatigue Patient left: in chair;with call bell/phone within reach;with chair alarm set Nurse Communication: Mobility status PT Visit Diagnosis: Unsteadiness on feet (R26.81);Muscle weakness (generalized) (M62.81)     Time: 5038-8828 PT Time Calculation (min) (ACUTE ONLY): 35 min  Charges:  $Therapeutic Activity: 23-37 mins                    G Codes:        {Keziah Drotar  PTA [facility]  Acute  Rehab Pager      [phone]

## 2017-08-28 NOTE — NC FL2 (Signed)
[place] MEDICAID FL2 LEVEL OF CARE SCREENING TOOL     IDENTIFICATION  Patient Name: Kimberly Castaneda Birthdate: 09-28-1941 Sex: female Admission Date (Current Location): 08/13/2017  [facility] and [place] Number:  Herbalist and Address:  [facility],  [place] [address], [place]      Provider Number: [id number]  Attending Physician Name and Address:  Bonnielee Haff, MD  Relative Name and Phone Number:       Current Level of Care: Hospital Recommended Level of Care: [place] Prior Approval Number:    Date Approved/Denied:   PASRR Number: [id number]  Discharge Plan: SNF    Current Diagnoses: Patient Active Problem List   Diagnosis Date Noted  . Pressure injury of skin 08/27/2017  . Cervical mass   . Anemia   . Syncope, vasovagal 08/26/2017  . Hypercalcemia 08/10/2017  . Leukocytosis 08/09/2017  . Normal coronary arteries 07/05/2017  . Weakness 07/05/2017  . Orthostatic hypotension 07/05/2017  . Chronic diastolic CHF (congestive heart failure), NYHA class 1 ([place]) 04/16/2013  . Nonischemic cardiomyopathy ([place])   . Systemic hypertension   . CKD (chronic kidney disease) stage 3, GFR 30-59 ml/min (HCC)   . Non-insulin dependent type 2 diabetes mellitus ([place])   . Dyslipidemia     Orientation RESPIRATION BLADDER Height & Weight     Self, Place  Normal Indwelling catheter Weight: 138 lb 1.6 oz (62.6 kg) Height:  5\' 2"  (157.5 cm)  BEHAVIORAL SYMPTOMS/MOOD NEUROLOGICAL BOWEL NUTRITION STATUS      Incontinent Diet (carb modified)  AMBULATORY STATUS COMMUNICATION OF NEEDS Skin   Total Care Verbally PU Stage and Appropriate Care PU Stage 1 Dressing:  (Mid Coccyx; Barrier cream;)                     Personal Care Assistance Level of Assistance  Bathing, Feeding, Dressing Bathing Assistance: Maximum assistance Feeding assistance: Limited assistance Dressing Assistance: Maximum assistance     Functional Limitations  Info  Sight Sight Info: Impaired        SPECIAL CARE FACTORS FREQUENCY  PT (By licensed PT), OT (By licensed OT)     PT Frequency: Min 3x/week              Contractures Contractures Info: Not present    Additional Factors Info  Code Status, Allergies Code Status Info: Full Code Allergies Info: Erythromycin;Quinapril;Ciprofloxacin Hcl           Current Medications (08/28/2017):  This is the current hospital active medication list Current Facility-Administered Medications  Medication Dose Route Frequency Provider Last Rate Last Dose  . 0.9 %  sodium chloride infusion   Intravenous Once Bonnielee Haff, MD      . acetaminophen (TYLENOL) tablet 650 mg  650 mg Oral Q6H PRN Thurnell Lose, MD       Or  . acetaminophen (TYLENOL) suppository 650 mg  650 mg Rectal Q6H PRN Thurnell Lose, MD   650 mg at 08/24/17 1726  . amoxicillin-clavulanate (AUGMENTIN) 500-125 MG per tablet 500 mg  1 tablet Oral Q12H Eudelia Bunch, RPH      . atorvastatin (LIPITOR) tablet 20 mg  20 mg Oral Daily Thurnell Lose, MD   20 mg at 08/28/17 1127  . carvedilol (COREG) tablet 3.125 mg  3.125 mg Oral BID WC Bonnielee Haff, MD   3.125 mg at 08/28/17 1135  . dextrose 5 % 850 mL with sodium bicarbonate 150 mEq infusion   Intravenous  Continuous Bonnielee Haff, MD 50 mL/hr at 08/28/17 1524    . fluticasone (FLONASE) 50 MCG/ACT nasal spray 1-2 spray  1-2 spray Each Nare Daily PRN Thurnell Lose, MD      . heparin ADULT infusion 100 units/mL (25000 units/267mL sodium chloride 0.45%)  1,050 Units/hr Intravenous Continuous Leodis Sias T, RPH 10.5 mL/hr at 08/28/17 1133 1,050 Units/hr at 08/28/17 1133  . HYDROcodone-acetaminophen (NORCO/VICODIN) 5-325 MG per tablet 1-2 tablet  1-2 tablet Oral Q4H PRN Schorr, Rhetta Mura, NP   2 tablet at 08/27/17 2229  . insulin aspart (novoLOG) injection 0-5 Units  0-5 Units Subcutaneous QHS Thurnell Lose, MD   2 Units at 08/23/17 2200  . insulin aspart  (novoLOG) injection 0-9 Units  0-9 Units Subcutaneous TID WC Thurnell Lose, MD   1 Units at 08/27/17 1153  . magnesium citrate solution 1 Bottle  1 Bottle Oral Once PRN Thurnell Lose, MD      . omega-3 acid ethyl esters (LOVAZA) capsule 1 g  1 g Oral Daily Thurnell Lose, MD   1 g at 08/28/17 1127  . ondansetron (ZOFRAN) injection 4 mg  4 mg Intravenous Q6H PRN Thurnell Lose, MD         Discharge Medications: Please see discharge summary for a list of discharge medications.  Relevant Imaging Results:  Relevant Lab Results:   Additional Information SSN [id number]  Burnis Medin, LCSW

## 2017-08-28 NOTE — Progress Notes (Signed)
[facility] Progress Note  Subjective:  UOP picking up Creatinine clearly falling now Acidosis looking better.  Vitals:   08/27/17 0517 08/27/17 1255 08/28/17 0518 08/28/17 1332  BP: (!) 148/67 (!) 155/81 132/65 121/85  Pulse: 99 (!) 101 100 99  Resp: 18 20 20 18   Temp: 98.2 F (36.8 C) 98.5 F (36.9 C) 98.5 F (36.9 C) 98.7 F (37.1 C)  TempSrc: Oral Oral Oral Oral  SpO2: 97% 99% 98% 99%  Weight: 62 kg (136 lb 11 oz)  62.6 kg (138 lb 1.6 oz)   Height:       Exam: Gen frail, no distress Still mildly confused No rash, cyanosis or gangrene No jvd  Chest clear bilat RRR no MRG Abd firm, distended, diffusely tender, +bs , no hsm or ascites Ext trace LE edema no change Urine clear  Inpatient medications: . amoxicillin-clavulanate  1 tablet Oral Q12H  . atorvastatin  20 mg Oral Daily  . carvedilol  3.125 mg Oral BID WC  . insulin aspart  0-5 Units Subcutaneous QHS  . insulin aspart  0-9 Units Subcutaneous TID WC  . omega-3 acid ethyl esters  1 g Oral Daily   . sodium chloride    . dextrose 5 % 850 mL with sodium bicarbonate 150 mEq infusion 50 mL/hr at 08/27/17 1539  . heparin 1,050 Units/hr (08/28/17 1133)   acetaminophen **OR** acetaminophen, fluticasone, HYDROcodone-acetaminophen, magnesium citrate, ondansetron (ZOFRAN) IV   Recent Labs Lab 08/26/17 0517 08/27/17 0443 08/28/17 0401  NA 132* 131* 134*  134*  K 4.4 4.0 3.5  3.5  CL 105 105 107  107  CO2 15* 14* 17*  17*  GLUCOSE 96 118* 126*  127*  BUN 34* 37* 37*  37*  CREATININE 3.94* 3.57* 2.56*  2.57*  CALCIUM 7.8* 7.5* 7.4*  7.4*  PHOS  --   --  2.6    Recent Labs Lab 08/22/17 0422 08/25/17 0727 08/28/17 0401  AST 13* 17  --   ALT 10* 10*  --   ALKPHOS 108 110  --   BILITOT 0.5 0.6  --   PROT 5.9* 4.9*  --   ALBUMIN 2.3* 1.7* 1.6*    Recent Labs Lab 08/22/17 0422  08/26/17 0517 08/27/17 0443 08/28/17 0401  WBC 26.9*  < > 37.0* 28.5* 22.9*  NEUTROABS 24.2*  --    --   --   --   HGB 8.0*  < > 8.6* 8.3* 7.8*  HCT 24.5*  < > 25.2* 24.1* 23.5*  MCV 87.5  < > 86.9 86.7 86.4  PLT 510*  < > 466* 458* 472*  < > = values in this interval not displayed.   Iron/TIBC/Ferritin/ %Sat    Component Value Date/Time   IRON 41 (L) 11/24/2010 0619   TIBC 170 (L) 11/24/2010 0619   FERRITIN 605 (H) 11/24/2010 0619   IRONPCTSAT 24 11/24/2010 0619   UA > negative 10/16 Renal US > 10- 11 cm kidneys, no hydro, normal bladder   Background:  76 y.o. year-old with hx of HTN, NICM, HL, DM2, CKD 2/3 , presented to ED 09/05/2017 with fall at home and gen'd weakness. Poor appetite, ^WBC, anemia present.  Calcium high at 10.9.  ONC recommended abd CT ->large mass in the pelvis (rec'd contrast). Also DVT of LLE. Creatinine increased 0.8 to 1.74  To 3.04 . Renal was asked to see. On ramipril PTA, none here. BP's soft on admission   Impression/Recommendations: 1. Acute renal failure -  baseline creat 0.9 on admission.   Foley in, urine clear, [place] no hydro. Presume contrast nephropathy/relative hypotension/ACE on board PTA. Creatinine clearly falling now, UOP picking up. Anticipate further recovery to baseline. Current IVF at 50 continue another 24 hours.  2. Metabolic acidosis - changed IVF to isotonic bicarb (and reduced rate to 50) on 10/22. Would continue another 24 hours and if good po stop tomorrow. 3. HTN - dc'd coreg due to soft BP's. BP up now. Back on low dose carvedilol 4. Stage IIIB cervical cancer. Locally invasive. Biopsy done. Treatment options being considered. Prob cause of hypercalcemia. Outpt eval recommended. 5. Chronic leukocytosis 6. LLE DVT - on IV heparin and renal fx will likely improve to point where apixaban can be used.  7. AMS - multifactorial, improving 8. Hypercalcemia - mild elev PTHrP. Cervical Ca likely cause.  9. Anemia - sp transfusion 10/20, Hb 7.8. Fe studies OK. 10. Aspiration PNA - Unasyn>Augmentin   Final renal recommendations:  Continue  IVF another 24 hours (isotonic bicarb at 50). If creatinine down again tomorrow can stop then (anticipate continued improvement as full 1 mg% drop in creatinine past 24 hours). Should not need continued bicarb replacement if renal recovery so no need to start po when IVF stopped. Agree with resumption carvedilol. Renal will sign off at this time. Please call if questions.   Jamal Maes, MD [facility] [phone] Pager 08/28/2017, 2:53 PM

## 2017-08-28 NOTE — Progress Notes (Addendum)
Pharmacy Antibiotic Note  Kimberly Castaneda is a 76 y.o. female admitted on 08/20/2017 with pneumonia.  Pharmacy has been consulted for Unasyn dosing.  Today, 08/28/2017:  Day #6 abx  SCr improving  (0.85 > 1.74 > 3.04>3.94>3.57>2.57), CrCl 16.51ml/min, UOP improved 1500/24 hrs, I/O neg 654  Afebrile  WBC elevated but coming down  Plan:  Unasyn changed to augmentin 500 mg po q12h  Pharmacy to sign off  Height: 5\' 2"  (157.5 cm) Weight: 138 lb 1.6 oz (62.6 kg) IBW/kg (Calculated) : 50.1  Temp (24hrs), Avg:98.5 F (36.9 C), Min:98.5 F (36.9 C), Max:98.5 F (36.9 C)   Recent Labs Lab 08/24/17 0438 08/25/17 0727 08/26/17 0517 08/27/17 0443 08/28/17 0401  WBC 30.5* 36.6* 37.0* 28.5* 22.9*  CREATININE 1.74* 3.04* 3.94* 3.57* 2.56*  2.57*    Estimated Creatinine Clearance: 16.5 mL/min (A) (by C-G formula based on SCr of 2.56 mg/dL (H)).    Allergies  Allergen Reactions  . Erythromycin Nausea And Vomiting  . Quinapril Other (See Comments)    unknown  . Ciprofloxacin Hcl Nausea Only    Antimicrobials this admission: 10/18 unasyn >>   Dose adjustments this admission: 10/19 Unasyn dose adjusted for increasing SCr. 10/20 Unasyn dose adjusted again  10/23 Unasyn dose adjusted to q12h  Microbiology results: 10/21 Ucx NGF  Thank you for allowing pharmacy to be a part of this patient's care. Eudelia Bunch, Pharm.D. [phone] 08/28/2017 10:01 AM

## 2017-08-28 NOTE — Progress Notes (Signed)
ANTICOAGULATION CONSULT NOTE - Follow up [place] for Lovenox --> IV heparin Indication: DVT  Allergies  Allergen Reactions  . Erythromycin Nausea And Vomiting  . Quinapril Other (See Comments)    unknown  . Ciprofloxacin Hcl Nausea Only    Patient Measurements: Height: 5\' 2"  (157.5 cm) Weight: 138 lb 1.6 oz (62.6 kg) IBW/kg (Calculated) : 50.1  Vital Signs: Temp: 98.5 F (36.9 C) (10/23 0518) Temp Source: Oral (10/23 0518) BP: 132/65 (10/23 0518) Pulse Rate: 100 (10/23 0518)  Labs:  Recent Labs  08/26/17 0517  08/27/17 0443 08/27/17 2236 08/28/17 0401 08/28/17 0723  HGB 8.6*  --  8.3*  --  7.8*  --   HCT 25.2*  --  24.1*  --  23.5*  --   PLT 466*  --  458*  --  472*  --   HEPARINUNFRC  --   < > 0.33 0.23*  --  0.26*  CREATININE 3.94*  --  3.57*  --  2.56*  2.57*  --   < > = values in this interval not displayed.  Estimated Creatinine Clearance: 16.5 mL/min (A) (by C-G formula based on SCr of 2.56 mg/dL (H)).   Assessment: 76 year old female admitted with near syncope.  Doppler of LLE on 10/17 reveals an acute DVT. FOBT negative 10/18. Pharmacy initially consulted to dose Lovenox. Acute rise in SCr 10/19-10/20 and little UOP. Nephrology consulted, likely contrast nephropathy.Pharmacy asked to transition patient to IV heparin on 10/21. Heparin held for biopsy 10/22 and resumed afterwards.    Heparin level this AM @ 0723 = 0.26 units/mL on heparin infusion at 1000 units/hr- slightly below goal of 0.3 - 0.7, infusing OK per RN  SCr elevated, but now trending down.   CBC: Hgb low 7.8 . Pltc elevated   No bleeding currently documented  Goal of Therapy:  Heparin level 0.3-0.7 units/ml Monitor platelets by anticoagulation protocol: Yes   Plan:  Increase heparin infusion to 1050 units/hr. Check heparin level 8 hours after heparin resumed. Daily CBC and heparin level while on heparin infusion. Monitor for s/sx of bleeding.  Eudelia Bunch,  Pharm.D. [phone] 08/28/2017 9:50 AM

## 2017-08-29 LAB — GLUCOSE, CAPILLARY
GLUCOSE-CAPILLARY: 185 mg/dL — AB (ref 65–99)
GLUCOSE-CAPILLARY: 151 mg/dL — AB (ref 65–99)
Glucose-Capillary: 141 mg/dL — ABNORMAL HIGH (ref 65–99)
Glucose-Capillary: 213 mg/dL — ABNORMAL HIGH (ref 65–99)

## 2017-08-29 LAB — CBC
HCT: 23.9 % — ABNORMAL LOW (ref 36.0–46.0)
Hemoglobin: 8.1 g/dL — ABNORMAL LOW (ref 12.0–15.0)
MCH: 29.3 pg (ref 26.0–34.0)
MCHC: 33.9 g/dL (ref 30.0–36.0)
MCV: 86.6 fL (ref 78.0–100.0)
PLATELETS: 488 10*3/uL — AB (ref 150–400)
RBC: 2.76 MIL/uL — AB (ref 3.87–5.11)
RDW: 15.5 % (ref 11.5–15.5)
WBC: 25.9 10*3/uL — AB (ref 4.0–10.5)

## 2017-08-29 LAB — MULTIPLE MYELOMA PANEL, SERUM
ALBUMIN SERPL ELPH-MCNC: 2.3 g/dL — AB (ref 2.9–4.4)
ALPHA 1: 0.4 g/dL (ref 0.0–0.4)
ALPHA2 GLOB SERPL ELPH-MCNC: 1.4 g/dL — AB (ref 0.4–1.0)
Albumin/Glob SerPl: 0.7 (ref 0.7–1.7)
B-GLOBULIN SERPL ELPH-MCNC: 0.9 g/dL (ref 0.7–1.3)
GAMMA GLOB SERPL ELPH-MCNC: 0.6 g/dL (ref 0.4–1.8)
GLOBULIN, TOTAL: 3.3 g/dL (ref 2.2–3.9)
IGG (IMMUNOGLOBIN G), SERUM: 585 mg/dL — AB (ref 700–1600)
IgA: 179 mg/dL (ref 64–422)
IgM (Immunoglobulin M), Srm: 153 mg/dL (ref 26–217)
TOTAL PROTEIN ELP: 5.6 g/dL — AB (ref 6.0–8.5)

## 2017-08-29 LAB — BASIC METABOLIC PANEL
ANION GAP: 11 (ref 5–15)
BUN: 29 mg/dL — AB (ref 6–20)
CO2: 23 mmol/L (ref 22–32)
Calcium: 6.8 mg/dL — ABNORMAL LOW (ref 8.9–10.3)
Chloride: 101 mmol/L (ref 101–111)
Creatinine, Ser: 1.83 mg/dL — ABNORMAL HIGH (ref 0.44–1.00)
GFR calc Af Amer: 30 mL/min — ABNORMAL LOW (ref >60)
GFR, EST NON AFRICAN AMERICAN: 26 mL/min — AB (ref >60)
Glucose, Bld: 131 mg/dL — ABNORMAL HIGH (ref 65–99)
POTASSIUM: 3.3 mmol/L — AB (ref 3.5–5.1)
SODIUM: 135 mmol/L (ref 135–145)

## 2017-08-29 LAB — HEPARIN LEVEL (UNFRACTIONATED): HEPARIN UNFRACTIONATED: 0.31 [IU]/mL (ref 0.30–0.70)

## 2017-08-29 MED ORDER — SODIUM CHLORIDE 0.9 % IV SOLN
1.0000 g | Freq: Once | INTRAVENOUS | Status: AC
  Administered 2017-08-29: 1 g via INTRAVENOUS
  Filled 2017-08-29: qty 10

## 2017-08-29 MED ORDER — ONDANSETRON HCL 4 MG/2ML IJ SOLN
4.0000 mg | INTRAMUSCULAR | Status: DC | PRN
Start: 2017-08-29 — End: 2017-09-06
  Administered 2017-08-29 – 2017-08-30 (×2): 4 mg via INTRAVENOUS
  Filled 2017-08-29 (×2): qty 2

## 2017-08-29 MED ORDER — METOCLOPRAMIDE HCL 5 MG/ML IJ SOLN
5.0000 mg | Freq: Once | INTRAMUSCULAR | Status: AC
  Administered 2017-08-29: 5 mg via INTRAVENOUS
  Filled 2017-08-29: qty 2

## 2017-08-29 MED ORDER — POTASSIUM CHLORIDE CRYS ER 20 MEQ PO TBCR
20.0000 meq | EXTENDED_RELEASE_TABLET | Freq: Once | ORAL | Status: DC
  Filled 2017-08-29: qty 1

## 2017-08-29 MED ORDER — PROMETHAZINE HCL 25 MG/ML IJ SOLN
12.5000 mg | Freq: Once | INTRAMUSCULAR | Status: AC
  Administered 2017-08-29: 12.5 mg via INTRAVENOUS
  Filled 2017-08-29: qty 1

## 2017-08-29 NOTE — Clinical Social Work Note (Signed)
Clinical Social Work Assessment  Patient Details  Name: Kimberly Castaneda MRN: [id number] Date of Birth: 08/01/1941  Date of referral:  08/28/17               Reason for consult:  Facility Placement                Permission sought to share information with:  Chartered certified accountant granted to share information::  Yes, Verbal Permission Granted  Name::        Agency::     Relationship::     Contact Information:     Housing/Transportation Living arrangements for the past 2 months:  Single Family Home Source of Information:  Patient, Spouse Patient Interpreter Needed:  None Criminal Activity/Legal Involvement Pertinent to Current Situation/Hospitalization:  No - Comment as needed Significant Relationships:  Adult Children, Spouse Lives with:  Spouse Do you feel safe going back to the place where you live?   (PT recommending SNF) Need for family participation in patient care:  Yes (Comment)  Care giving concerns:  Patient from home with spouse. Patient's spouse reported that patient has had multiple falls at home and the last fall they were unable to get patient up. Patient's spouse reported that patient ambulates with a cane at baseline. PT recommending SNF.    Social Worker assessment / plan:  CSW spoke with patient and patient's husband at bedside regarding PT recommendation for SNF. Patient's husband reported that patient can not return home in her current condition. Patient and patient's husband agreeable to SNF for ST rehab. CSW explained SNF placement process and inquired about any preferred SNFs, patient/patient's husband reported none.   CSW obtained PASRR, will complete FL2 and provide bed offers. CSW will continue to follow and assist with discharge planning.   Employment status:  Retired Nurse, adult PT Recommendations:  [facility] / Referral to community resources:  [place]  Patient/Family's Response to care:  Patient/patient's husband agreeable to SNF for rehab. Patient reported that her care has been good and that everyone is nice.   Patient/Family's Understanding of and Emotional Response to Diagnosis, Current Treatment, and Prognosis:  Patient's husband verbalized understanding of patient's current treatment and reported that they are waiting to hear back about patient's mass. Patient's husband involved in patient's care and presented concerned, CSW validated concerns. Patient's husband verbalized plan for patient to discharge to SNF for ST rehab, patient agreed.   Emotional Assessment Appearance:  Appears stated age Attitude/Demeanor/Rapport:  Lethargic Affect (typically observed):  Calm Orientation:  Oriented to Self, Oriented to Place Alcohol / Substance use:  Not Applicable Psych involvement (Current and /or in the community):  No (Comment)  Discharge Needs  Concerns to be addressed:  Care Coordination Readmission within the last 30 days:  No Current discharge risk:  Dependent with Mobility Barriers to Discharge:  Continued Medical Work up   [employer], LCSW 08/29/2017, 9:28 AM

## 2017-08-29 NOTE — Progress Notes (Signed)
Patient ID: Kimberly Castaneda, female   DOB: 09/10/1941, 76 y.o.   MRN: [id number]    PROGRESS NOTE  Kimberly Castaneda  WVP:[id number] DOB: Mar 09, 1941 DOA: 08/26/2017  PCP: [name], MD   Brief Narrative:  Patient is 76 year old female with known and chronic diastolic CHF, nonischemic cardiomyopathy, hypertension, chronic leukocytosis, presented after an episode of near syncope. Patient was brought into the hospital day after syncopal event and was found to have hypercalcemia, leukocytosis, DVT in the left lower extremity. Patient was seen by oncology, underwent CT scan of the chest abdomen and pelvis which raises concern for uterine mass. Patient subsequently developed acute renal failure due to contrast, nephrology was consulted but has currently signed off his creatinine function has improved. Patient also underwent biopsy of the uterine/cervical mass on Monday, 08/27/2017.  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 Denman George - Recommendation is to obtain PET/CT scan but this will be scheduled as an outpatient - It was determined the patient is not a candidate for surgical intervention at this time - Dr. Maryland Pink discussed with Dr. Lebron Conners, recommend follow-up with Dr. Alvy Bimler in an outpatient setting  Near syncope - Etiology not entirely clear, suspected dehydration - No recurrence since admission  Acute Left lower extremity DVT. - initially placed on Lovenox but due to worsening renal function, changed to heparin - If creatinine better, plan to change to oral  anticoagulation Apixaban in AM  Aspiration pneumonia - currently on Augmentin and will continue same regimen  Leukocytosis. - still elevated, etiology not entirely clear - Has been treated appropriately with antibiotics, currently no fevers, no signs of worsening infectious etiology - CBC in the morning  Hypercalcemia, hypokalemia - calcium currently on low side, will supplement and repeat in the morning - supplement K  History of nonischemic cardiomyopathy/chronic diastolic dysfunction. - echo results outlined below  Diabetes mellitus type 2. - continue sliding scale insulin  History of essential hypertension - reasonable inpatient control - continue Coreg  Normocytic anemia. - has been transfused 1 unit of blood on 08/24/2017 - no evidence of active bleeding  DVT prophylaxis: IV heparin Code Status: full code Family Communication: Patient at bedside  Disposition Plan: skilled nursing facility in the morning  Consultants:   Medical oncology  GYN oncology  Nephrology  Procedures:   Left lower extremity venous Doppler, positive for acute DVT in the posterior tibial vein  Transthoracic echo EF 55-60% with grade 1 diastolic CHF  Antimicrobials:   Unasyn initiated on 08/23/2017, transition to oral Augmentin 08/28/2017 -->  Subjective: Patient reports feeling better, wants to go home.  Objective: Vitals:   08/28/17 1641 08/28/17 2048 08/29/17 0500 08/29/17 0504  BP: 122/70 (!) 120/50  (!) 143/64  Pulse: 100 96  99  Resp:  18  18  Temp:  98.4 F (36.9 C)  98.4 F (36.9 C)  TempSrc:  Oral  Oral  SpO2:  96%  98%  Weight:   63.4 kg (139 lb 12.4 oz)   Height:        Intake/Output Summary (Last 24 hours) at 08/29/17 0815 Last data filed at  08/29/17 0600  Gross per 24 hour  Intake          2432.06 ml  Output              850 ml  Net          1582.06 ml   Filed Weights   08/27/17 0517 08/28/17 0518 08/29/17 0500  Weight: 62 kg (136 lb 11 oz)  62.6 kg (138 lb 1.6 oz) 63.4 kg (139 lb 12.4 oz)    Examination:  General exam: Appears calm and comfortable  Respiratory system: Clear to auscultation. Respiratory effort normal. Cardiovascular system: S1 & S2 heard, RRR. No JVD, murmurs, rubs, gallops or clicks.  Gastrointestinal system: Abdomen is nondistended, soft and nontender. No organomegaly or masses felt. Normal bowel sounds heard. Central nervous system: Alert and oriented. No focal neurological deficits. Extremities: Symmetric 5 x 5 power. Left lower extremity still larger than the right one, slightly tender to palpation  Data Reviewed: I have personally reviewed following labs and imaging studies  CBC:  Recent Labs Lab 08/25/17 0727 08/26/17 0517 08/27/17 0443 08/28/17 0401 08/29/17 0502  WBC 36.6* 37.0* 28.5* 22.9* 25.9*  HGB 8.4* 8.6* 8.3* 7.8* 8.1*  HCT 24.9* 25.2* 24.1* 23.5* 23.9*  MCV 88.0 86.9 86.7 86.4 86.6  PLT 439* 466* 458* 472* 062*   Basic Metabolic Panel:  Recent Labs Lab 08/25/17 0727 08/26/17 0517 08/27/17 0443 08/28/17 0401 08/29/17 0502  NA 131* 132* 131* 134*  134* 135  K 4.1 4.4 4.0 3.5  3.5 3.3*  CL 106 105 105 107  107 101  CO2 17* 15* 14* 17*  17* 23  GLUCOSE 142* 96 118* 126*  127* 131*  BUN 28* 34* 37* 37*  37* 29*  CREATININE 3.04* 3.94* 3.57* 2.56*  2.57* 1.83*  CALCIUM 7.9* 7.8* 7.5* 7.4*  7.4* 6.8*  PHOS  --   --   --  2.6  --    Liver Function Tests:  Recent Labs Lab 08/25/17 0727 08/28/17 0401  AST 17  --   ALT 10*  --   ALKPHOS 110  --   BILITOT 0.6  --   PROT 4.9*  --   ALBUMIN 1.7* 1.6*   CBG:  Recent Labs Lab 08/27/17 2151 08/28/17 0720 08/28/17 1110 08/28/17 1627 08/28/17 2046  GLUCAP 143* 129* 120* 157* 124*   Urine analysis:    Component Value Date/Time   COLORURINE YELLOW 08/26/2017 1654   APPEARANCEUR CLOUDY (A) 08/26/2017 1654   LABSPEC 1.029 08/26/2017 1654   PHURINE 5.0 08/26/2017 1654   GLUCOSEU NEGATIVE 08/26/2017 1654    HGBUR MODERATE (A) 08/26/2017 1654   BILIRUBINUR NEGATIVE 08/26/2017 1654   KETONESUR 5 (A) 08/26/2017 1654   PROTEINUR 100 (A) 08/26/2017 1654   UROBILINOGEN 1.0 11/21/2010 2342   NITRITE NEGATIVE 08/26/2017 1654   LEUKOCYTESUR LARGE (A) 08/26/2017 1654   Recent Results (from the past 240 hour(s))  Culture, Urine     Status: None   Collection Time: 08/26/17  6:44 PM  Result Value Ref Range Status   Specimen Description URINE, RANDOM  Final   Special Requests NONE  Final   Culture   Final    NO GROWTH Performed at [facility], [address], [place], [place] [address]    Report Status 08/28/2017 FINAL  Final    Radiology Studies: No results found.  Scheduled Meds: . amoxicillin-clavulanate  1 tablet Oral Q12H  . atorvastatin  20 mg Oral Daily  .  carvedilol  3.125 mg Oral BID WC  . insulin aspart  0-5 Units Subcutaneous QHS  . insulin aspart  0-9 Units Subcutaneous TID WC  . omega-3 acid ethyl esters  1 g Oral Daily   Continuous Infusions: . sodium chloride    . dextrose 5 % 850 mL with sodium bicarbonate 150 mEq infusion 50 mL/hr at 08/28/17 1524  . heparin 1,050 Units/hr (08/28/17 1843)     LOS: 7 days   Time spent: 25 minutes   Faye Ramsay, MD [facility] Pager [phone]  If 7PM-7AM, please contact night-coverage [email or web address] Password [email or web address] 08/29/2017, 8:15 AM

## 2017-08-29 NOTE — Progress Notes (Signed)
ANTICOAGULATION CONSULT NOTE - Follow up Kimberly Castaneda for Lovenox --> IV heparin Indication: DVT  Allergies  Allergen Reactions  . Erythromycin Nausea And Vomiting  . Quinapril Other (See Comments)    unknown  . Ciprofloxacin Hcl Nausea Only    Patient Measurements: Height: 5\' 2"  (157.5 cm) Weight: 139 lb 12.4 oz (63.4 kg) IBW/kg (Calculated) : 50.1  Vital Signs: Temp: 98.4 F (36.9 C) (10/24 0504) Temp Source: Oral (10/24 0504) BP: 143/64 (10/24 0504) Pulse Rate: 99 (10/24 0504)  Labs:  Recent Labs  08/27/17 0443  08/28/17 0401 08/28/17 0723 08/28/17 1904 08/29/17 0502  HGB 8.3*  --  7.8*  --   --  8.1*  HCT 24.1*  --  23.5*  --   --  23.9*  PLT 458*  --  472*  --   --  488*  HEPARINUNFRC 0.33  < >  --  0.26* 0.37 0.31  CREATININE 3.57*  --  2.56*  2.57*  --   --  1.83*  < > = values in this interval not displayed.  Estimated Creatinine Clearance: 23.2 mL/min (A) (by C-G formula based on SCr of 1.83 mg/dL (H)).   Assessment: 76 year old female admitted with near syncope.  Doppler of LLE on 10/17 reveals an acute DVT. FOBT negative 10/18. Pharmacy initially consulted to dose Lovenox. Acute rise in SCr 10/19-10/20 and little UOP. Nephrology consulted, likely contrast nephropathy.Pharmacy asked to transition patient to IV heparin on 10/21. Heparin held for biopsy 10/22 and resumed afterwards.    Heparin level this AM  = 0.31 units/mL on heparin infusion at 1050 units/hr- therapeutic  SCr trending down. Per renal consult 10/23:  renal fx will likely improve to point where apixaban can be used.  CBC: Hgb low but stable . Pltc elevated   No bleeding currently documented  Goal of Therapy:  Heparin level 0.3-0.7 units/ml Monitor platelets by anticoagulation protocol: Yes   Plan:  Continue heparin infusion at 1050 units/hr. Daily CBC and heparin level while on heparin infusion. Monitor for s/sx of bleeding.  Eudelia Bunch,  Pharm.D. [phone] 08/29/2017 7:24 AM

## 2017-08-30 ENCOUNTER — Inpatient Hospital Stay (HOSPITAL_COMMUNITY): Payer: Medicare Other

## 2017-08-30 DIAGNOSIS — I9589 Other hypotension: Secondary | ICD-10-CM

## 2017-08-30 LAB — BASIC METABOLIC PANEL
Anion gap: 21 — ABNORMAL HIGH (ref 5–15)
BUN: 36 mg/dL — ABNORMAL HIGH (ref 6–20)
CHLORIDE: 94 mmol/L — AB (ref 101–111)
CO2: 24 mmol/L (ref 22–32)
CREATININE: 2.16 mg/dL — AB (ref 0.44–1.00)
Calcium: 7 mg/dL — ABNORMAL LOW (ref 8.9–10.3)
GFR, EST AFRICAN AMERICAN: 25 mL/min — AB (ref >60)
GFR, EST NON AFRICAN AMERICAN: 21 mL/min — AB (ref >60)
Glucose, Bld: 167 mg/dL — ABNORMAL HIGH (ref 65–99)
POTASSIUM: 3.5 mmol/L (ref 3.5–5.1)
SODIUM: 139 mmol/L (ref 135–145)

## 2017-08-30 LAB — HEMOGLOBIN AND HEMATOCRIT, BLOOD
HCT: 34.3 % — ABNORMAL LOW (ref 36.0–46.0)
HCT: 29.6 % — ABNORMAL LOW (ref 36.0–46.0)
HCT: 26.7 % — ABNORMAL LOW (ref 36.0–46.0)
HEMOGLOBIN: 11.7 g/dL — AB (ref 12.0–15.0)
HEMOGLOBIN: 9.4 g/dL — AB (ref 12.0–15.0)
Hemoglobin: 10.3 g/dL — ABNORMAL LOW (ref 12.0–15.0)

## 2017-08-30 LAB — GLUCOSE, CAPILLARY
GLUCOSE-CAPILLARY: 161 mg/dL — AB (ref 65–99)
GLUCOSE-CAPILLARY: 167 mg/dL — AB (ref 65–99)
GLUCOSE-CAPILLARY: 148 mg/dL — AB (ref 65–99)
Glucose-Capillary: 127 mg/dL — ABNORMAL HIGH (ref 65–99)
Glucose-Capillary: 143 mg/dL — ABNORMAL HIGH (ref 65–99)
Glucose-Capillary: 121 mg/dL — ABNORMAL HIGH (ref 65–99)

## 2017-08-30 LAB — CBC
HCT: 18.7 % — ABNORMAL LOW (ref 36.0–46.0)
HEMOGLOBIN: 6.3 g/dL — AB (ref 12.0–15.0)
MCH: 29.3 pg (ref 26.0–34.0)
MCHC: 33.7 g/dL (ref 30.0–36.0)
MCV: 87 fL (ref 78.0–100.0)
PLATELETS: 566 10*3/uL — AB (ref 150–400)
RBC: 2.15 MIL/uL — AB (ref 3.87–5.11)
RDW: 15.6 % — ABNORMAL HIGH (ref 11.5–15.5)
WBC: 35.8 10*3/uL — ABNORMAL HIGH (ref 4.0–10.5)

## 2017-08-30 LAB — LACTIC ACID, PLASMA
LACTIC ACID, VENOUS: 3.6 mmol/L — AB (ref 0.5–1.9)
Lactic Acid, Venous: 9 mmol/L (ref 0.5–1.9)

## 2017-08-30 LAB — HEPARIN LEVEL (UNFRACTIONATED): HEPARIN UNFRACTIONATED: 0.59 [IU]/mL (ref 0.30–0.70)

## 2017-08-30 LAB — PROCALCITONIN: Procalcitonin: 27.63 ng/mL

## 2017-08-30 LAB — MRSA PCR SCREENING: MRSA BY PCR: NEGATIVE

## 2017-08-30 LAB — PREPARE RBC (CROSSMATCH)

## 2017-08-30 MED ORDER — INSULIN ASPART 100 UNIT/ML ~~LOC~~ SOLN
0.0000 [IU] | SUBCUTANEOUS | Status: DC
  Administered 2017-08-30: 2 [IU] via SUBCUTANEOUS
  Administered 2017-08-30: 1 [IU] via SUBCUTANEOUS
  Administered 2017-08-30: 2 [IU] via SUBCUTANEOUS
  Administered 2017-08-31 (×3): 1 [IU] via SUBCUTANEOUS
  Administered 2017-08-31 – 2017-09-01 (×2): 2 [IU] via SUBCUTANEOUS
  Administered 2017-09-01 – 2017-09-02 (×3): 1 [IU] via SUBCUTANEOUS

## 2017-08-30 MED ORDER — PANTOPRAZOLE SODIUM 40 MG IV SOLR
40.0000 mg | Freq: Two times a day (BID) | INTRAVENOUS | Status: DC
  Administered 2017-08-30 – 2017-09-03 (×9): 40 mg via INTRAVENOUS
  Filled 2017-08-30 (×9): qty 40

## 2017-08-30 MED ORDER — PHENYLEPHRINE HCL-NACL 10-0.9 MG/250ML-% IV SOLN: 0.0000 ug/min | INTRAVENOUS | Status: DC

## 2017-08-30 MED ORDER — DEXTROSE 5 % IV SOLN
2.0000 g | INTRAVENOUS | Status: DC
  Administered 2017-08-30 – 2017-09-02 (×4): 2 g via INTRAVENOUS
  Filled 2017-08-30 (×5): qty 2

## 2017-08-30 MED ORDER — PROMETHAZINE HCL 25 MG/ML IJ SOLN
12.5000 mg | INTRAMUSCULAR | Status: DC | PRN
  Administered 2017-09-01: 12.5 mg via INTRAVENOUS
  Filled 2017-08-30: qty 1

## 2017-08-30 MED ORDER — SODIUM CHLORIDE 0.9 % IV SOLN
3.0000 g | Freq: Two times a day (BID) | INTRAVENOUS | Status: DC
  Administered 2017-08-30: 3 g via INTRAVENOUS
  Filled 2017-08-30 (×2): qty 3

## 2017-08-30 MED ORDER — SODIUM CHLORIDE 0.9 % IV SOLN
Freq: Once | INTRAVENOUS | Status: AC
  Administered 2017-08-30: 10:00:00 via INTRAVENOUS

## 2017-08-30 MED ORDER — SODIUM CHLORIDE 0.9 % IV SOLN
1.5000 g | Freq: Two times a day (BID) | INTRAVENOUS | Status: DC
  Administered 2017-08-30: 1.5 g via INTRAVENOUS
  Filled 2017-08-30 (×2): qty 1.5

## 2017-08-30 MED ORDER — MORPHINE SULFATE (PF) 4 MG/ML IV SOLN
1.0000 mg | Freq: Once | INTRAVENOUS | Status: AC
  Administered 2017-08-30: 1 mg via INTRAVENOUS
  Filled 2017-08-30: qty 1

## 2017-08-30 MED ORDER — VANCOMYCIN HCL IN DEXTROSE 1-5 GM/200ML-% IV SOLN
1000.0000 mg | Freq: Once | INTRAVENOUS | Status: AC
  Administered 2017-08-30: 1000 mg via INTRAVENOUS
  Filled 2017-08-30: qty 200

## 2017-08-30 MED ORDER — SODIUM CHLORIDE 0.9 % IV BOLUS (SEPSIS)
500.0000 mL | Freq: Once | INTRAVENOUS | Status: AC
  Administered 2017-08-30: 500 mL via INTRAVENOUS

## 2017-08-30 NOTE — Progress Notes (Addendum)
ANTICOAGULATION CONSULT NOTE - Follow up [place] for Lovenox --> IV heparin Indication: DVT  Allergies  Allergen Reactions  . Erythromycin Nausea And Vomiting  . Quinapril Other (See Comments)    unknown  . Ciprofloxacin Hcl Nausea Only    Patient Measurements: Height: 5\' 2"  (157.5 cm) Weight: 139 lb 12.4 oz (63.4 kg) IBW/kg (Calculated) : 50.1  Vital Signs: Temp: 98.9 F (37.2 C) (10/25 0649) Temp Source: Rectal (10/25 0649) BP: 72/54 (10/25 0652) Pulse Rate: 104 (10/25 0649)  Labs:  Recent Labs  08/28/17 0401  08/28/17 1904 08/29/17 0502 08/30/17 0425  HGB 7.8*  --   --  8.1* 6.3*  HCT 23.5*  --   --  23.9* 18.7*  PLT 472*  --   --  488* 566*  HEPARINUNFRC  --   < > 0.37 0.31 0.59  CREATININE 2.56*  2.57*  --   --  1.83* 2.16*  < > = values in this interval not displayed.  Estimated Creatinine Clearance: 19.7 mL/min (A) (by C-G formula based on SCr of 2.16 mg/dL (H)).   Assessment: 76 year old female admitted with near syncope.  Doppler of LLE on 10/17 reveals an acute DVT. FOBT negative 10/18. Pharmacy initially consulted to dose Lovenox. Acute rise in SCr 10/19-10/20 and little UOP. Nephrology consulted, likely contrast nephropathy.Pharmacy asked to transition patient to IV heparin on 10/21. Heparin held for biopsy 10/22 and resumed afterwards.    Heparin level this AM  = 0.59 units/mL on heparin infusion at 1050 units/hr- therapeutic  SCr up to 2.16 today. Per renal consult 10/23:  renal fx will likely improve to point where apixaban can be used.  CBC: Hgb dropped 8.1>>6.3 - 2 U PRBCs ordered . Pltc elevated   No bleeding currently documented but concern for abd pain and decr Hg  Goal of Therapy:  Heparin level 0.3-0.7 units/ml Monitor platelets by anticoagulation protocol: Yes   Plan:  Continue heparin infusion at 1050 units/hr- f/u w/ Dr Doyle Askew about continuing heparin Daily CBC and heparin level while on heparin infusion. Monitor  for s/sx of bleeding. Adjust Unasyn dose to 1.5 gm IV q12h   Eudelia Bunch, Pharm.D. [phone] 08/30/2017 7:26 AM

## 2017-08-30 NOTE — Progress Notes (Signed)
Report given to [place], ICU RN.  Patient transferred to room 1232.  Husband Kimberly Castaneda updated re: patient's status and new bed assignment.

## 2017-08-30 NOTE — Progress Notes (Addendum)
At 2357 paged by RN with concerns about patient abdominal pain/discomfort, N/V. VS stable. Patient A/Ox3. Paged again for continued abdominal pain, N/V. At 0400. Patient with complaints of severe epigastric pain/discomfort. Still with N/V. 0500 patient has not voided with foley catheter in place but abdominal pain and N.V resolved. H/H 04/08/17.7 0600 NP called to bedside to assess patient for urinary retention, hypotension (BP 78/50). Patient abdomen soft, nontender,distended. Slightly tachycardiac. LSCTA.  Patient responds to verbal stimuli. Will transfer to SDU for higher level of care.   Assessment/plan: Abdominal pain - Morphine IV, flat plate of abdomen. Resolved Nausea/Vomiting -Phenergan Reglan. Changed abx to Unasyn IV. Resolved Urinary retention - Bladder scan. Replaced foley, in/out cath, [place] of abdomen Hypotension - NS Bolus 543ml Worsening anemia - Ordered 2 units PRBCs Transferred patient to SDU for further management.

## 2017-08-30 NOTE — Progress Notes (Signed)
Dr. Ashok Cordia made aware of patient's very low urine output.  VSS.  Order placed for renal US.  Will continue to monitor.

## 2017-08-30 NOTE — Consult Note (Signed)
[facility] Consult Note  Kimberly Castaneda 18-Jul-1941  [id number].    Requesting MD: Doyle Askew Chief Complaint/Reason for Consult: Possible gastric outlet obstruction HPI:  Patient is a 76 y/o female who was brought to the hospital after a syncopal event 08/06/2017. Found to have hypercalcemia, leukocytosis, and LLE DVT. PMH significant for chronic diastolic CHF, nonischemic cardiomyopathy, HTN, chronic leukocytosis. Seen by oncology, had CT that demonstrated uterine mass. No GOO seen on CT 10/18. Patient had bx of uterine mass 10/22. Patient had large volume emesis overnight 10/24-10/25, confusion, hypotension. Abdominal film showed gastric distention 10/25. Hgb noted to be 6, and patient was being transfused with PRBC.   ROS: Review of Systems  Unable to perform ROS: Mental acuity    History reviewed. No pertinent family history.  Past Medical History:  Diagnosis Date  . CKD (chronic kidney disease) stage 3, GFR 30-59 ml/min (HCC)   . Diabetes mellitus ([place])    type 2  . Hypercholesterolemia   . Mitral regurgitation   . Nonischemic cardiomyopathy ([place])   . Systemic hypertension   . Vertigo     Past Surgical History:  Procedure Laterality Date  . CARDIAC CATHETERIZATION  07/18/2010   r/t nonsichemic cardiomyopathy; normal coronary anatomy; EF 20%;   . TONSILLECTOMY    . TUBAL LIGATION    . US ECHOCARDIOGRAPHY  04/03/2012   EF 55%; mild LVH    Social History:  reports that she has never smoked. She has never used smokeless tobacco. She reports that she does not drink alcohol or use drugs.  Allergies:  Allergies  Allergen Reactions  . Erythromycin Nausea And Vomiting  . Quinapril Other (See Comments)    unknown  . Ciprofloxacin Hcl Nausea Only    Medications Prior to Admission  Medication Sig Dispense Refill  . alendronate (FOSAMAX) 70 MG tablet Take 70 mg by mouth once a week.     . Ascorbic Acid (VITA-C PO) Take 1 tablet by mouth daily.    Marland Kitchen aspirin 81 MG  tablet Take 81 mg by mouth daily.    Marland Kitchen atorvastatin (LIPITOR) 20 MG tablet Take 20 mg by mouth daily.    . carvedilol (COREG) 12.5 MG tablet Take 1 tablet (12.5 mg total) by mouth 2 (two) times daily. (Patient taking differently: Take 25 mg by mouth 2 (two) times daily. ) 180 tablet 0  . cholecalciferol (VITAMIN D) 1000 UNITS tablet Take 1,000 Units by mouth daily.    . fluticasone (FLONASE) 50 MCG/ACT nasal spray Place 1-2 sprays into both nostrils daily as needed for allergies.     . hydrochlorothiazide (MICROZIDE) 12.5 MG capsule Take 12.5 mg by mouth daily.    Marland Kitchen linagliptin (TRADJENTA) 5 MG TABS tablet Take 5 mg by mouth daily.    . OMEGA 3 1200 MG CAPS Take 1,200 mg by mouth daily.    . ramipril (ALTACE) 10 MG tablet Take 10 mg by mouth 2 (two) times daily.      Blood pressure (!) 60/47, pulse (!) 105, temperature (!) 96.8 F (36 C), temperature source Rectal, resp. rate (!) 29, height _0  (1.575 m), weight 63.7 kg (140 lb 6.9 oz), SpO2 97 %. Physical Exam: Physical Exam  Constitutional: She appears well-developed and well-nourished. She appears lethargic. She appears ill. No distress.  Mittens present on hands BL  HENT:  Head: Normocephalic and atraumatic.  Right Ear: External ear normal.  Left Ear: External ear normal.  Nose: Nose normal.  Mouth/Throat: Mucous membranes are normal.  Eyes: Lids are normal.  Neck: Neck supple. No thyromegaly present.  Cardiovascular: Regular rhythm.  Tachycardia present.   Pulses:      Radial pulses are 1+ on the right side, and 1+ on the left side.       Dorsalis pedis pulses are 1+ on the right side, and 1+ on the left side.  Pulmonary/Chest: Effort normal. She has rales in the right lower field and the left lower field.  Abdominal: Soft. Normal appearance. She exhibits distension (mild). Bowel sounds are decreased. There is no hepatosplenomegaly. There is no tenderness. There is no rigidity, no rebound and no guarding. No hernia.   Musculoskeletal:  No swelling or deformity of BL upper and lower extremities  Neurological: She appears lethargic.  Skin: Skin is warm, dry and intact. There is pallor.  Psychiatric: She is noncommunicative.    Results for orders placed or performed during the hospital encounter of 08/19/2017 (from the past 48 hour(s))  Glucose, capillary     Status: Abnormal   Collection Time: 08/28/17 11:10 AM  Result Value Ref Range   Glucose-Capillary 120 (H) 65 - 99 mg/dL  Glucose, capillary     Status: Abnormal   Collection Time: 08/28/17  4:27 PM  Result Value Ref Range   Glucose-Capillary 157 (H) 65 - 99 mg/dL  Heparin level (unfractionated)     Status: None   Collection Time: 08/28/17  7:04 PM  Result Value Ref Range   Heparin Unfractionated 0.37 0.30 - 0.70 IU/mL    Comment:        IF HEPARIN RESULTS ARE BELOW EXPECTED VALUES, AND PATIENT DOSAGE HAS BEEN CONFIRMED, SUGGEST FOLLOW UP TESTING OF ANTITHROMBIN III LEVELS.   Glucose, capillary     Status: Abnormal   Collection Time: 08/28/17  8:46 PM  Result Value Ref Range   Glucose-Capillary 124 (H) 65 - 99 mg/dL  CBC     Status: Abnormal   Collection Time: 08/29/17  5:02 AM  Result Value Ref Range   WBC 25.9 (H) 4.0 - 10.5 K/uL   RBC 2.76 (L) 3.87 - 5.11 MIL/uL   Hemoglobin 8.1 (L) 12.0 - 15.0 g/dL   HCT 23.9 (L) 36.0 - 46.0 %   MCV 86.6 78.0 - 100.0 fL   MCH 29.3 26.0 - 34.0 pg   MCHC 33.9 30.0 - 36.0 g/dL   RDW 15.5 11.5 - 15.5 %   Platelets 488 (H) 150 - 400 K/uL  Basic metabolic panel     Status: Abnormal   Collection Time: 08/29/17  5:02 AM  Result Value Ref Range   Sodium 135 135 - 145 mmol/L   Potassium 3.3 (L) 3.5 - 5.1 mmol/L   Chloride 101 101 - 111 mmol/L   CO2 23 22 - 32 mmol/L   Glucose, Bld 131 (H) 65 - 99 mg/dL   BUN 29 (H) 6 - 20 mg/dL   Creatinine, Ser 1.83 (H) 0.44 - 1.00 mg/dL   Calcium 6.8 (L) 8.9 - 10.3 mg/dL   GFR calc non Af Amer 26 (L) >60 mL/min   GFR calc Af Amer 30 (L) >60 mL/min    Comment:  (NOTE) The eGFR has been calculated using the CKD EPI equation. This calculation has not been validated in all clinical situations. eGFR's persistently <60 mL/min signify possible Chronic Kidney Disease.    Anion gap 11 5 - 15  Heparin level (unfractionated)     Status: None   Collection Time: 08/29/17  5:02 AM  Result Value  Ref Range   Heparin Unfractionated 0.31 0.30 - 0.70 IU/mL    Comment:        IF HEPARIN RESULTS ARE BELOW EXPECTED VALUES, AND PATIENT DOSAGE HAS BEEN CONFIRMED, SUGGEST FOLLOW UP TESTING OF ANTITHROMBIN III LEVELS.   Glucose, capillary     Status: Abnormal   Collection Time: 08/29/17  8:32 AM  Result Value Ref Range   Glucose-Capillary 141 (H) 65 - 99 mg/dL  Glucose, capillary     Status: Abnormal   Collection Time: 08/29/17  1:13 PM  Result Value Ref Range   Glucose-Capillary 185 (H) 65 - 99 mg/dL  Glucose, capillary     Status: Abnormal   Collection Time: 08/29/17  5:30 PM  Result Value Ref Range   Glucose-Capillary 151 (H) 65 - 99 mg/dL  Glucose, capillary     Status: Abnormal   Collection Time: 08/29/17 10:27 PM  Result Value Ref Range   Glucose-Capillary 213 (H) 65 - 99 mg/dL  CBC     Status: Abnormal   Collection Time: 08/30/17  4:25 AM  Result Value Ref Range   WBC 35.8 (H) 4.0 - 10.5 K/uL   RBC 2.15 (L) 3.87 - 5.11 MIL/uL   Hemoglobin 6.3 (LL) 12.0 - 15.0 g/dL    Comment: REPEATED TO VERIFY DELTA CHECK NOTED CRITICAL RESULT CALLED TO, READ BACK BY AND VERIFIED WITH: L PATRUM RN [id number] 08/30/17 A NAVARRO    HCT 18.7 (L) 36.0 - 46.0 %   MCV 87.0 78.0 - 100.0 fL   MCH 29.3 26.0 - 34.0 pg   MCHC 33.7 30.0 - 36.0 g/dL   RDW 15.6 (H) 11.5 - 15.5 %   Platelets 566 (H) 150 - 400 K/uL  Basic metabolic panel     Status: Abnormal   Collection Time: 08/30/17  4:25 AM  Result Value Ref Range   Sodium 139 135 - 145 mmol/L   Potassium 3.5 3.5 - 5.1 mmol/L   Chloride 94 (L) 101 - 111 mmol/L   CO2 24 22 - 32 mmol/L   Glucose, Bld 167 (H) 65 - 99  mg/dL   BUN 36 (H) 6 - 20 mg/dL   Creatinine, Ser 2.16 (H) 0.44 - 1.00 mg/dL   Calcium 7.0 (L) 8.9 - 10.3 mg/dL   GFR calc non Af Amer 21 (L) >60 mL/min   GFR calc Af Amer 25 (L) >60 mL/min    Comment: (NOTE) The eGFR has been calculated using the CKD EPI equation. This calculation has not been validated in all clinical situations. eGFR's persistently <60 mL/min signify possible Chronic Kidney Disease.    Anion gap 21 (H) 5 - 15  Heparin level (unfractionated)     Status: None   Collection Time: 08/30/17  4:25 AM  Result Value Ref Range   Heparin Unfractionated 0.59 0.30 - 0.70 IU/mL    Comment:        IF HEPARIN RESULTS ARE BELOW EXPECTED VALUES, AND PATIENT DOSAGE HAS BEEN CONFIRMED, SUGGEST FOLLOW UP TESTING OF ANTITHROMBIN III LEVELS.   Type and screen [place]     Status: None (Preliminary result)   Collection Time: 08/30/17  5:45 AM  Result Value Ref Range   ABO/RH(D) O POS    Antibody Screen NEG    Sample Expiration 08/19/2017    Unit Number [id number]    Blood Component Type RED CELLS,LR    Unit division 00    Status of Unit ISSUED    Transfusion Status OK TO TRANSFUSE  Crossmatch Result Compatible    Unit Number [id number]    Blood Component Type RED CELLS,LR    Unit division 00    Status of Unit ALLOCATED    Transfusion Status OK TO TRANSFUSE    Crossmatch Result Compatible   Prepare RBC     Status: None   Collection Time: 08/30/17  5:46 AM  Result Value Ref Range   Order Confirmation ORDER PROCESSED BY BLOOD BANK   Glucose, capillary     Status: Abnormal   Collection Time: 08/30/17  7:46 AM  Result Value Ref Range   Glucose-Capillary 127 (H) 65 - 99 mg/dL   Comment 1 Notify RN    Dg Abd Portable 1v  Result Date: 08/30/2017 CLINICAL DATA:  76 year old female with sudden onset nausea vomiting. EXAM: PORTABLE ABDOMEN - 1 VIEW COMPARISON:  Abdominal CT dated 08/23/2017 and ultrasound dated 08/25/2017 FINDINGS: There is air  distention of the stomach with relative paucity of the small bowel gas. Findings may be related to dysmotility of the stomach or a degree of gastric outlet obstruction. Clinical correlation and follow-up recommended. There is no date of the small bowel. Multiple pockets of air noted in the right colon. A pocket of air and soft tissue density over the pelvis likely corresponds to the necrotic mass seen on the prior CT. No free air identified on the provided image. There is osteopenia with degenerative changes of the spine and hips. No acute osseous pathology. IMPRESSION: 1. Moderate air distention of the stomach with paucity of small bowel gas may represent gastric dysmotility versus less likely gastric outlet obstruction. 2. No dilatation of the small bowel or evidence of small-bowel obstruction. 3. Soft tissue opacity with pocket of air in the pelvis corresponding to the necrotic mass seen on the prior CT. Electronically Signed   By: Anner Crete M.D.   On: 08/30/2017 01:24      Assessment/Plan Acute on chronic kidney disease stage II/non-anion gap metabolic acidosis Acute metabolic encephalopathy Uterine/cervical mass - bx 10/22, path pending Syncope Acute LLE DVT - heparin being held due to ABL anemia Aspiration PNA - Unasyn Leukocytosis - trending up, 35.8 this AM from 25.9 yesterday Hypercalcemia Hypokalemia Non-ischemic cardiomyopathy/Chronic diastolic CHF Type 2 DM - SSI HTN - currently hypotensive, likely due to low hgb ABL anemia imposed on chronic anemia - hgb 6 this AM, transfusing 2u PRBC  Nausea and Vomiting - XR: Moderate air distention of the stomach with paucity of small bowel gas may represent gastric dysmotility versus less likely gastric outlet obstruction - this is more likely gastric ileus, would be unlikely for patient to have developed GOO with CT on 10/18 showing no signs of this - place NGT and connect to LIWS, film to confirm tube placement - repeat XR tomorrow  AM - allow for decompression of stomach, could consider contrasted study in a few days vs endoscopy - consider GI consult - bowel rest - no indications for acute surgical intervention  FEN: NPO, IVF; NGT to LIWS VTE: holding heparin due to low hgb;  ID: IV unasyn (10/25>>) Foley: 10/19   Brigid Re, [facility] Surgery 08/30/2017, 8:54 AM Pager: [phone] Consults: [phone] Mon-Fri 7:00 am-4:30 pm Sat-Sun 7:00 am-11:30 am

## 2017-08-30 NOTE — Progress Notes (Signed)
PCCM Attending Note:  Labs reviewed. Lactic acid 9.0, Procalcitonin elevated at 27.63 and Hgb now 11.7. Patient more awake & alert. BP now stable. Question possible sepsis given leukocytosis.  1. Stat blood cultures x2 peripheral, U/A, urine culture, and portable chest x-ray. 2. Switching from Unasyn to Vancomycin & [name] for empiric coverage. 3. Continuing to trend procalcitonin & lactic acid.  Sonia Baller Ashok Cordia, M.D. [facility] Pulmonary & Critical Care Pager:  [phone] After 7pm or if no response, call [phone] 3:13 PM 08/30/17

## 2017-08-30 NOTE — Progress Notes (Signed)
Pt admitted to unit.  Was hypotensive upon admission to unit.  2 U PRBCs ordered.  Critical Care MD ordered for them to be administered as fast as they could go.  Both units administered; vital signs stabilized during and post administration.  Neosynephrine ordered if needed, currently BP is 140/84 (104), HR 105, O2 98% on room air; no signs or symptoms of a transfusion reaction have occurred.  Husband is at bedside.  RN and MD spoke with him about the plan of care; he verbalized understanding.    Will continue to monitor.

## 2017-08-30 NOTE — Progress Notes (Signed)
Procalcitonin resulted: 27.63. Critical Care MD made aware, new medication and lab orders placed.  Will continue to monitor.

## 2017-08-30 NOTE — Progress Notes (Addendum)
Patient ID: Kimberly Castaneda, female   DOB: [birth year], 76 y.o.   MRN: [id number]    PROGRESS NOTE  Kimberly Castaneda  BOF:[id number] DOB: [birth year] DOA: 08/20/2017  PCP: [name], MD   Brief Narrative:  Patient is 76 year old female with known and chronic diastolic CHF, nonischemic cardiomyopathy, hypertension, chronic leukocytosis, presented after an episode of near syncope. Patient was brought into the hospital day after syncopal event and was found to have hypercalcemia, leukocytosis, DVT in the left lower extremity. Patient was seen by oncology, underwent CT scan of the chest abdomen and pelvis which raises concern for uterine mass. Patient subsequently developed acute renal failure due to contrast, nephrology was consulted but has currently signed off his creatinine function has improved. Patient also underwent biopsy of the uterine/cervical mass on Monday, 08/27/2017.  Overnight 10/24 to 10/25, pt with multiple episodes of vomiting, more confusion and hypotension, SBP in 70's, required transfer to SDU 08/30/2017 am, blood work also notable for drop in Hg ~6 and 2 U PRBC ordered for transfusion. ABd XRAY worrisome for GOO. Surgery and PCCM consulted for assistance.   Assessment & Plan: Nausea and vomiting - in pt with recent finding of uterine/vervical mass concerning for malignancy - worse overnight and this AM with multiple episodes of vomiting - abd XRAY with ? GOO - place NGT  - will consult with surgery   Hypotension - in the setting of the above and acute blood loss anemia - difficult to obtain reliable BP as pt rather agitated and unable to stay still - 2 U ordered for transfusion - stop Coreg  - keep in SDU - PCCM consulted   Acute on chronic kidney disease stage II/non-anion gap metabolic acidosis - suspected to be contrast-induced - Overall creatinine was trending down but overnight due to vomiting and hypotension, Cr up again  - Renal ultrasound did not show any  signs of hydronephrosis - Nephrology team signed off 10/23 - I suspect this will improve if BP and abd issues stabilize   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 Denman George - Recommendation is to obtain PET/CT scan but this will be scheduled as an outpatient - Dr. Maryland Pink discussed with Dr. Lebron Conners, recommend follow-up with Dr. Alvy Bimler in an outpatient setting - now on abd XRAY ? GOO, will consult with surgery team here to see if any additional recommendations   Acute metabolic encephalopathy - worse this AM due to the above mentioned issues - stay in SDU - PCCM c/s  Near syncope - Etiology not entirely clear, suspected dehydration - No recurrence since admission  Acute Left lower extremity DVT. - initially placed on Lovenox but due to worsening renal function, changed to heparin - once Cr is better, nephrology recommended Apixaban  - due to drop in Hg I will Heparin and hold off on any additional AC until blood counts stable   Aspiration pneumonia - was on Augmentin but now due to NPO status, changed back to Unasyn   Leukocytosis. - still elevated, etiology not entirely clear - Has been treated appropriately with antibiotics and despite this, ABC have been elevated - I suspect that WBC worse this AM from underlying acute event mentioned above    Hypercalcemia, hypokalemia - K is WNL this AM but still on low end of normal   History of nonischemic cardiomyopathy/chronic diastolic dysfunction. - echo results  outlined below  Diabetes mellitus type 2. - continue sliding scale insulin  History of essential hypertension - hold antihypertensive regimen for now   Normocytic anemia. Acute blood loss anemia imposed on chronic anemia  - has been transfused 1 unit of blood on 08/24/2017 - now drop in Hg again, stop heparin  and transfuse two U PRBC   DVT prophylaxis: IV heparin stopped, changed to SCD Code Status: full code Family Communication: no family at bedside, updated son over the phone Disposition Plan: transferred to SDU   Consultants:   Medical oncology  GYN oncology  Nephrology - signed off 10/23  PCCM  Surgery   Procedures:   Left lower extremity venous Doppler, positive for acute DVT in the posterior tibial vein  Transthoracic echo EF 55-60% with grade 1 diastolic CHF  Antimicrobials:   Unasyn initiated on 08/23/2017, transition to oral Augmentin 08/28/2017 --> 10/24  Unasyn 10/24 -->  Subjective: Pt with more vomiting and needs transfer to SDU.   Objective: Vitals:   08/29/17 0849 08/29/17 2139 08/30/17 0649 08/30/17 0652  BP: 138/69 132/67 (!) 78/50 (!) 72/54  Pulse: 95 76 (!) 104   Resp: 16 18 (!) 28   Temp: 98.3 F (36.8 C) 98.2 F (36.8 C) 98.9 F (37.2 C)   TempSrc: Oral Oral Rectal   SpO2: 98% 98% 100%   Weight:      Height:        Intake/Output Summary (Last 24 hours) at 08/30/17 0759 Last data filed at 08/29/17 2200  Gross per 24 hour  Intake              756 ml  Output             1800 ml  Net            -1044 ml   Filed Weights   08/27/17 0517 08/28/17 0518 08/29/17 0500  Weight: 62 kg (136 lb 11 oz) 62.6 kg (138 lb 1.6 oz) 63.4 kg (139 lb 12.4 oz)   Physical Exam  Constitutional: Appears confused, somewhat restless CVS: tachycardic, S1/S2 +, no gallops, no carotid bruit.  Pulmonary: Effort and breath sounds normal, diminished at bases  Abdominal: Soft but distended  Musculoskeletal: Normal range of motion. No edema and no tenderness.   Data Reviewed: I have personally reviewed following labs and imaging studies  CBC:  Recent Labs Lab 08/26/17 0517 08/27/17 0443 08/28/17 0401 08/29/17 0502 08/30/17 0425  WBC 37.0* 28.5* 22.9* 25.9* 35.8*  HGB 8.6* 8.3* 7.8* 8.1* 6.3*  HCT 25.2* 24.1* 23.5* 23.9* 18.7*  MCV 86.9 86.7 86.4 86.6 87.0    PLT 466* 458* 472* 488* 161*   Basic Metabolic Panel:  Recent Labs Lab 08/26/17 0517 08/27/17 0443 08/28/17 0401 08/29/17 0502 08/30/17 0425  NA 132* 131* 134*  134* 135 139  K 4.4 4.0 3.5  3.5 3.3* 3.5  CL 105 105 107  107 101 94*  CO2 15* 14* 17*  17* 23 24  GLUCOSE 96 118* 126*  127* 131* 167*  BUN 34* 37* 37*  37* 29* 36*  CREATININE 3.94* 3.57* 2.56*  2.57* 1.83* 2.16*  CALCIUM 7.8* 7.5* 7.4*  7.4* 6.8* 7.0*  PHOS  --   --  2.6  --   --    Liver Function Tests:  Recent Labs Lab 08/25/17 0727 08/28/17 0401  AST 17  --   ALT 10*  --   ALKPHOS 110  --   BILITOT 0.6  --  PROT 4.9*  --   ALBUMIN 1.7* 1.6*   CBG:  Recent Labs Lab 08/28/17 2046 08/29/17 0832 08/29/17 1313 08/29/17 1730 08/29/17 2227  GLUCAP 124* 141* 185* 151* 213*   Urine analysis:    Component Value Date/Time   COLORURINE YELLOW 08/26/2017 1654   APPEARANCEUR CLOUDY (A) 08/26/2017 1654   LABSPEC 1.029 08/26/2017 1654   PHURINE 5.0 08/26/2017 1654   GLUCOSEU NEGATIVE 08/26/2017 1654   HGBUR MODERATE (A) 08/26/2017 1654   BILIRUBINUR NEGATIVE 08/26/2017 1654   KETONESUR 5 (A) 08/26/2017 1654   PROTEINUR 100 (A) 08/26/2017 1654   UROBILINOGEN 1.0 11/21/2010 2342   NITRITE NEGATIVE 08/26/2017 1654   LEUKOCYTESUR LARGE (A) 08/26/2017 1654   Recent Results (from the past 240 hour(s))  Culture, Urine     Status: None   Collection Time: 08/26/17  6:44 PM  Result Value Ref Range Status   Specimen Description URINE, RANDOM  Final   Special Requests NONE  Final   Culture   Final    NO GROWTH Performed at [facility], [place] [address], [place], [place] [address]    Report Status 08/28/2017 FINAL  Final    Radiology Studies: Dg Abd Portable 1v  Result Date: 08/30/2017 CLINICAL DATA:  76 year old female with sudden onset nausea vomiting. EXAM: PORTABLE ABDOMEN - 1 VIEW COMPARISON:  Abdominal CT dated 08/23/2017 and ultrasound dated 08/25/2017 FINDINGS: There is air  distention of the stomach with relative paucity of the small bowel gas. Findings may be related to dysmotility of the stomach or a degree of gastric outlet obstruction. Clinical correlation and follow-up recommended. There is no date of the small bowel. Multiple pockets of air noted in the right colon. A pocket of air and soft tissue density over the pelvis likely corresponds to the necrotic mass seen on the prior CT. No free air identified on the provided image. There is osteopenia with degenerative changes of the spine and hips. No acute osseous pathology. IMPRESSION: 1. Moderate air distention of the stomach with paucity of small bowel gas may represent gastric dysmotility versus less likely gastric outlet obstruction. 2. No dilatation of the small bowel or evidence of small-bowel obstruction. 3. Soft tissue opacity with pocket of air in the pelvis corresponding to the necrotic mass seen on the prior CT. Electronically Signed   By: Anner Crete M.D.   On: 08/30/2017 01:24    Scheduled Meds: . atorvastatin  20 mg Oral Daily  . carvedilol  3.125 mg Oral BID WC  . insulin aspart  0-5 Units Subcutaneous QHS  . insulin aspart  0-9 Units Subcutaneous TID WC  . omega-3 acid ethyl esters  1 g Oral Daily  . potassium chloride  20 mEq Oral Once   Continuous Infusions: . sodium chloride    . sodium chloride    . ampicillin-sulbactam (UNASYN) IV    . dextrose 5 % 850 mL with sodium bicarbonate 150 mEq infusion 50 mL/hr at 08/29/17 1606  . heparin 1,050 Units/hr (08/29/17 1944)     LOS: 8 days   Time spent: 35 minutes   Faye Ramsay, MD [facility] Pager [phone]  If 7PM-7AM, please contact night-coverage [email or web address] Password [email or web address] 08/30/2017, 7:59 AM

## 2017-08-30 NOTE — Progress Notes (Signed)
CRITICAL VALUE ALERT  Critical Value:  Lactic Acid 9.0  Date & Time Notied:  08/30/2017, 1405  Provider Notified: Doyle Askew  Orders Received/Actions taken: Awaiting Procalcitonin results for further orders

## 2017-08-30 NOTE — Progress Notes (Signed)
Pt's hep gtt was stopped at 0430 d/t Hgb of 6.3

## 2017-08-30 NOTE — Progress Notes (Signed)
On call notified several times during shift. Pt continuing to have episodes of brown emesis. PO antibiotic due at 2000. Pt unable to tolerate. On-call ordered Phenergan and Reglan. Pt began having severe diarrhea, but no emesis. Pt then complained that she was unable to urinate even though she had a FC in place. FC flushed and was patent. On call notified and ordered a new FC . Still no urine output. An In & Out was ordered and it was also unsuccessful. Pt 's VS were 98.9 rectal, 78/50 dinamap, 104, 28, 100% RA. BP taken manually was 72/54. Pt extremely pale and Hgb was 6.3. NP's C Bodenheimer and X. Blount assessed pt and sent her to SD. 2 units of blood were ordered.

## 2017-08-30 NOTE — Progress Notes (Signed)
Pharmacy Antibiotic Note  Kimberly Castaneda is a 76 y.o. female admitted on 08/15/2017 with syncope, new DVT.  She is known to pharmacy from antibiotic & anticoagulant dosing.   She is currently on Day#9 antibiotics for aspiration PNA.  She is deteriorating clinically and antibiotics are being broadened to coverage hospital acquired pathogens.   Pharmacy has been consulted for Vancomycin & Fortaz dosing.  08/30/2017:  WBC increased to 35.8  Procalcitonin increased to 27.63, LA elevated at 9.0  Hypothermic (T 81F)  Scr rising today although UOP 0.71ml/Kg/hr.  Estimated CrCl~37ml/min  Cx data pending.  MRSA PCR negative.  Plan:  Fortaz 2gm IV q24h  Vancomycin 1gm IV x1 dose.  Will check random levels & re-dose when indicated.  Monitor renal function and cx data   Daily Scr  Height: 5\' 2"  (157.5 cm) Weight: 140 lb 6.9 oz (63.7 kg) IBW/kg (Calculated) : 50.1  Temp (24hrs), Avg:96.9 F (36.1 C), Min:96 F (35.6 C), Max:98.9 F (37.2 C)   Recent Labs Lab 08/26/17 0517 08/27/17 0443 08/28/17 0401 08/29/17 0502 08/30/17 0425 08/30/17 1303  WBC 37.0* 28.5* 22.9* 25.9* 35.8*  --   CREATININE 3.94* 3.57* 2.56*  2.57* 1.83* 2.16*  --   LATICACIDVEN  --   --   --   --   --  9.0*    Estimated Creatinine Clearance: 19.7 mL/min (A) (by C-G formula based on SCr of 2.16 mg/dL (H)).    Allergies  Allergen Reactions  . Erythromycin Nausea And Vomiting  . Quinapril Other (See Comments)    unknown  . Ciprofloxacin Hcl Nausea Only   Antimicrobials this admission: 10/18 unasyn >> 10/23   10/25 x1 dose 10/23 augmentin >>10/25 10/25 Fortaz>> 10/25 Vanc>>  Dose adjustments this admission: 10/20 Decrease Unasyn from q12h to q24h d/t rising SCr. 10/23 Inc Unasyn to 1.5 q12  10/25 MD changed augmentin to Unasyn 3 gm q12, renally adj to 1.5g q12h  Microbiology results: 10/21 UCx: NGF 10/25 BCx:  10/25 Ucx: 10/25 MRSA PCR: negative  Thank you for allowing pharmacy to be a  part of this patient's care.  Biagio Borg 08/30/2017 3:17 PM

## 2017-08-30 NOTE — Consult Note (Addendum)
[place] [facility] Attending Consult  Physician Requesting Consult:  Mart Piggs, M.D. / [facility]  Date of Consult:  08/30/2017  Reason for Consult/Chief Complaint:  Hypotension  History of Presenting Illness:  76 y.o. female with known history of chronic diastolic congestive heart failure and nonischemic cardiomyopathy. Patient was brought to the hospital after a syncopal event with hypercalcemia and leukocytosis. Ultimately was found to have a DVT in her left lower extremity and was started on systemic anticoagulation with a heparin drip. Patient was seen by oncology and underwent CT imaging of the abdomen and pelvis which showed concern for a uterine mass. She subsequently developed acute renal failure thought to be secondary to a contrast nephropathy with normal renal ultrasound. Patient underwent renal ultrasound imaging which showed normal renal size and no evidence of hydronephrosis. Patient has been on treatment for aspiration pneumonia with Unasyn. Critical care was consulted due to hypotension this morning as well as blood transfusion was ordered. No visible signs of active bleeding.  Review of Systems: Unable to obtain given altered mentation.   Allergies  Allergen Reactions  . Erythromycin Nausea And Vomiting  . Quinapril Other (See Comments)    unknown  . Ciprofloxacin Hcl Nausea Only    No current facility-administered medications on file prior to encounter.    Current Outpatient Prescriptions on File Prior to Encounter  Medication Sig Dispense Refill  . alendronate (FOSAMAX) 70 MG tablet Take 70 mg by mouth once a week.     . Ascorbic Acid (VITA-C PO) Take 1 tablet by mouth daily.    Marland Kitchen aspirin 81 MG tablet Take 81 mg by mouth daily.    Marland Kitchen atorvastatin (LIPITOR) 20 MG tablet Take 20 mg by mouth daily.    . carvedilol (COREG) 12.5 MG tablet Take 1 tablet (12.5 mg total) by mouth 2 (two) times daily. (Patient taking differently: Take 25 mg by mouth 2 (two) times  daily. ) 180 tablet 0  . cholecalciferol (VITAMIN D) 1000 UNITS tablet Take 1,000 Units by mouth daily.    . fluticasone (FLONASE) 50 MCG/ACT nasal spray Place 1-2 sprays into both nostrils daily as needed for allergies.     . hydrochlorothiazide (MICROZIDE) 12.5 MG capsule Take 12.5 mg by mouth daily.    . OMEGA 3 1200 MG CAPS Take 1,200 mg by mouth daily.    . ramipril (ALTACE) 10 MG tablet Take 10 mg by mouth 2 (two) times daily.      Past Medical History:  Diagnosis Date  . CKD (chronic kidney disease) stage 3, GFR 30-59 ml/min (HCC)   . Diabetes mellitus ([employer])    type 2  . Hypercholesterolemia   . Mitral regurgitation   . Nonischemic cardiomyopathy ([place])   . Systemic hypertension   . Vertigo     Past Surgical History:  Procedure Laterality Date  . CARDIAC CATHETERIZATION  07/18/2010   r/t nonsichemic cardiomyopathy; normal coronary anatomy; EF 20%;   . TONSILLECTOMY    . TUBAL LIGATION    . US ECHOCARDIOGRAPHY  04/03/2012   EF 55%; mild LVH    History reviewed. No pertinent family history.  Social History   Social History  . Marital status: Married    Spouse name: N/A  . Number of children: N/A  . Years of education: N/A   Social History Main Topics  . Smoking status: Never Smoker  . Smokeless tobacco: Never Used  . Alcohol use No  . Drug use: No  . Sexual activity: Not  Asked   Other Topics Concern  . None   Social History Narrative  . None    Temp:  [96.8 F (36 C)-98.9 F (37.2 C)] 96.8 F (36 C) (10/25 0847) Pulse Rate:  [76-105] 99 (10/25 0900) Resp:  [18-29] 20 (10/25 0900) BP: (60-132)/(43-67) 82/43 (10/25 0900) SpO2:  [97 %-100 %] 97 % (10/25 0900) Weight:  [140 lb 6.9 oz (63.7 kg)] 140 lb 6.9 oz (63.7 kg) (10/25 0800)  General:  Confused. Nurse at bedside. No family at bedside.  Integument:  Somewhat pale. Warm. Dry.  Extremities:  No cyanosis or clubbing.  Lymphatics:  No appreciated cervical or supraclavicular lymphadenoapthy. HEENT:   Tacky mucus membranes. No scleral icterus.  Cardiovascular:  Regular rate. No JVD appreciated.  Pulmonary:  Clear breath sounds bilaterally. Normal work of breathing on room air. Abdomen: Soft. Protuberant. Normal bowel sounds.  Musculoskeletal:  Normal bulk. No joint effusion appreciated. Neurological:  Spontaneously moving all 4 extremities. Eyes open. Not following commands. No meningismus.  Psychiatric:  Unable to assess given altered mental status.   LINES/TUBES: Foley >>> PIV  CBC Latest Ref Rng & Units 08/30/2017 08/29/2017 08/28/2017  WBC 4.0 - 10.5 K/uL 35.8(H) 25.9(H) 22.9(H)  Hemoglobin 12.0 - 15.0 g/dL 6.3(LL) 8.1(L) 7.8(L)  Hematocrit 36.0 - 46.0 % 18.7(L) 23.9(L) 23.5(L)  Platelets 150 - 400 K/uL 566(H) 488(H) 472(H)    BMP Latest Ref Rng & Units 08/30/2017 08/29/2017 08/28/2017  Glucose 65 - 99 mg/dL 167(H) 131(H) 126(H)  BUN 6 - 20 mg/dL 36(H) 29(H) 37(H)  Creatinine 0.44 - 1.00 mg/dL 2.16(H) 1.83(H) 2.56(H)  BUN/Creat Ratio 12 - 28 - - -  Sodium 135 - 145 mmol/L 139 135 134(L)  Potassium 3.5 - 5.1 mmol/L 3.5 3.3(L) 3.5  Chloride 101 - 111 mmol/L 94(L) 101 107  CO2 22 - 32 mmol/L 24 23 17(L)  Calcium 8.9 - 10.3 mg/dL 7.0(L) 6.8(L) 7.4(L)   Hepatic Function Latest Ref Rng & Units 08/28/2017 08/25/2017 08/22/2017  Total Protein 6.5 - 8.1 g/dL - 4.9(L) 5.9(L)  Albumin 3.5 - 5.0 g/dL 1.6(L) 1.7(L) 2.3(L)  AST 15 - 41 U/L - 17 13(L)  ALT 14 - 54 U/L - 10(L) 10(L)  Alk Phosphatase 38 - 126 U/L - 110 108  Total Bilirubin 0.3 - 1.2 mg/dL - 0.6 0.5    IMAGING/STUDIES: BILATERAL LE VENOUS DUPLEX 10/17:  DVT left lower extremity in posterior tibial, gastrocnemius, popliteal, and distal/mid-femoral vein.  TTE 10/17:  LV normal in size with EF 55-60%. Normal regional wall motion with grade 1 diastolic dysfunction. LA & RA normal in size. RV normal in size and function. Pulmonary artery systolic pressure 25 mmHg. No aortic stenosis or regurgitation. Aortic root normal in  size. No mitral stenosis or regurgitation. No pulmonic stenosis. Trivial tricuspid regurgitation. No pericardial effusion. CT HEAD/C-SPINE W/O 10/17: IMPRESSION: 1. No acute intracranial abnormalities.  Old left occipital infarct. 2. Normal alignment of the cervical spine. Degenerative changes throughout with bridging anterior osteophytes. No acute displaced fractures identified. CTA CHEST/ABD/PELVIS 10/18: IMPRESSION: 1. Enlarged abnormal appearing uterus with nodular areas of enhancement, areas of cystic change/necrosis and internal gas most likely representing a neoplasm, question cervical cancer versus endometrial cancer. 2. Diffuse stranding of surrounding fat planes cannot exclude tumor extension, with suspected small parametrial nodule on RIGHT 5 mm diameter, LEFT ureteral nodule/ metastasis 4 mm diameter, and involvement of the distal sigmoid colon. 3. No distant metastases. 4. No evidence of pulmonary embolism. 5. Focus of RIGHT upper lobe infiltrate question  pneumonia. 6. Several tiny nonspecific RIGHT lung nodules. 7. Cholelithiasis. RENAL U/S 10/20:  Normal renal ultrasound for age. PORT ABD X-RAY 10/25: IMPRESSION: 1. Moderate air distention of the stomach with paucity of small bowel gas may represent gastric dysmotility versus less likely gastric outlet obstruction. 2. No dilatation of the small bowel or evidence of small-bowel obstruction. 3. Soft tissue opacity with pocket of air in the pelvis corresponding to the necrotic mass seen on the prior CT.  MICROBIOLOGY: Urine Culture 10/21:  Negative   ANTIBIOTICS: Unasyn 10/18 >>>  SIGNIFICANT EVENTS: 10/16 - Admit 10/21 - Heparin drip started for DVT 10/24 - Transferred to ICU overnight with recurrent episodes of nausea with emesis and hypotension.  10/25 - Heparin drip stopped w/ hypotension & anemia requiring transfusion of 2u PRBC. Surgery consulted for possible gastric outlet obstruction.  ASSESSMENT/PLAN:  76 y.o.  female with newly found uterine mass suspicious for malignancy admitted after a fall with a left lower extremity DVT. Patient currently medically stable with new anemia on systemic anticoagulation with heparin.  1. Shock: Sepsis versus hypovolemia. Continuing blood products and volume resuscitation. Low-dose peripherally infused Neo-Synephrine to maintain mean arterial pressure. 2. Anemia: No visible signs of active bleeding. Holding systemic anticoagulation. Transfusion 2 units packed red blood cells stat. Starting Protonix IV every 12 hours. Trending hemoglobin/hematocrit every 4 hours. 3. Acute encephalopathy: Likely multifactorial. Question element of toxic metabolic encephalopathy. Limit any sedatives. 4. Acute on chronic renal failure stage 3: Continuing Foley catheter to monitor urine output. Maintaining MAP > trending electrolytes and renal function daily. 5. Possible sepsis: Continuing Unasyn empirically. Checking Procalcitonin per algorithm. Trending lactic acid. Plan to culture for fever. Plan to broaden antibiotics at Procalcitonin is significantly elevated. 6. Uterine mass: Pathology pending. 7. Possible gastric outlet obstruction: Surgery already consulted/notified by hospitalist. Defer to their recommendations. Continuing Zofran IV when necessary. Also ordering Phenergan IV when necessary.  8. Left lower extremity DVT: Holding heparin drip. Considering IVC filter pending clinical stabilization. 9. Essential hypertension: Holding antihypertensive regimen. Monitoring vitals preamp protocol. 10. Hyperlipidemia: Holding home medication.  Prophylaxis:  Holding heparin drip given anemia & hypotension. Starting Protonix IV q12hr.  Diet:  Switching patient to NPO given altered mentation. Code Status:  Full Code per previous physician discussions. Disposition:  Currently critically ill in the ICU. Family Update:  No family at bedside at the time of my exam.  I have personally spent a total  of 33 minutes of critical care time today caring for the patient & reviewing the patient's electronic medical record.  Sonia Baller Ashok Cordia, M.D. [facility] Pager:  [phone] After 7pm or if no response, call [phone] 9:16 AM 08/30/17

## 2017-08-30 NOTE — Plan of Care (Signed)
Problem: Fluid Volume: Goal: Ability to maintain a balanced intake and output will improve Outcome: Not Progressing Pt has been having episodes of emesis.

## 2017-08-31 ENCOUNTER — Inpatient Hospital Stay (HOSPITAL_COMMUNITY): Payer: Medicare Other

## 2017-08-31 LAB — BPAM RBC
Blood Product Expiration Date: 201811232359
Blood Product Expiration Date: 201811232359
ISSUE DATE / TIME: 201810250847
ISSUE DATE / TIME: 201810250927
Unit Type and Rh: 5100
Unit Type and Rh: 5100

## 2017-08-31 LAB — GLUCOSE, CAPILLARY
GLUCOSE-CAPILLARY: 148 mg/dL — AB (ref 65–99)
Glucose-Capillary: 103 mg/dL — ABNORMAL HIGH (ref 65–99)
Glucose-Capillary: 116 mg/dL — ABNORMAL HIGH (ref 65–99)
Glucose-Capillary: 119 mg/dL — ABNORMAL HIGH (ref 65–99)
Glucose-Capillary: 132 mg/dL — ABNORMAL HIGH (ref 65–99)
Glucose-Capillary: 125 mg/dL — ABNORMAL HIGH (ref 65–99)

## 2017-08-31 LAB — URINALYSIS, ROUTINE W REFLEX MICROSCOPIC
Bilirubin Urine: NEGATIVE
Glucose, UA: NEGATIVE mg/dL
Ketones, ur: 5 mg/dL — AB
Nitrite: NEGATIVE
PH: 5 (ref 5.0–8.0)
Protein, ur: 30 mg/dL — AB
SPECIFIC GRAVITY, URINE: 1.017 (ref 1.005–1.030)

## 2017-08-31 LAB — CBC WITH DIFFERENTIAL/PLATELET
BASOS ABS: 0 10*3/uL (ref 0.0–0.1)
Basophils Relative: 0 %
EOS ABS: 0 10*3/uL (ref 0.0–0.7)
Eosinophils Relative: 0 %
HEMATOCRIT: 24.5 % — AB (ref 36.0–46.0)
HEMOGLOBIN: 8.8 g/dL — AB (ref 12.0–15.0)
LYMPHS PCT: 8 %
Lymphs Abs: 3.5 10*3/uL (ref 0.7–4.0)
MCH: 30.3 pg (ref 26.0–34.0)
MCHC: 35.9 g/dL (ref 30.0–36.0)
MCV: 84.5 fL (ref 78.0–100.0)
MONOS PCT: 3 %
Monocytes Absolute: 1.3 10*3/uL — ABNORMAL HIGH (ref 0.1–1.0)
NEUTROS PCT: 89 %
Neutro Abs: 39.4 10*3/uL — ABNORMAL HIGH (ref 1.7–7.7)
Platelets: 380 10*3/uL (ref 150–400)
RBC: 2.9 MIL/uL — ABNORMAL LOW (ref 3.87–5.11)
RDW: 15.1 % (ref 11.5–15.5)
WBC: 44.2 10*3/uL — ABNORMAL HIGH (ref 4.0–10.5)

## 2017-08-31 LAB — HEMOGLOBIN AND HEMATOCRIT, BLOOD
HCT: 23.3 % — ABNORMAL LOW (ref 36.0–46.0)
HCT: 23.6 % — ABNORMAL LOW (ref 36.0–46.0)
HCT: 24 % — ABNORMAL LOW (ref 36.0–46.0)
HCT: 25.4 % — ABNORMAL LOW (ref 36.0–46.0)
HCT: 26.3 % — ABNORMAL LOW (ref 36.0–46.0)
HEMATOCRIT: 24.6 % — AB (ref 36.0–46.0)
HEMATOCRIT: 24.4 % — AB (ref 36.0–46.0)
HEMOGLOBIN: 8 g/dL — AB (ref 12.0–15.0)
HEMOGLOBIN: 9.1 g/dL — AB (ref 12.0–15.0)
HEMOGLOBIN: 8.8 g/dL — AB (ref 12.0–15.0)
HEMOGLOBIN: 8.6 g/dL — AB (ref 12.0–15.0)
Hemoglobin: 9 g/dL — ABNORMAL LOW (ref 12.0–15.0)
Hemoglobin: 8.3 g/dL — ABNORMAL LOW (ref 12.0–15.0)
Hemoglobin: 8.1 g/dL — ABNORMAL LOW (ref 12.0–15.0)

## 2017-08-31 LAB — TYPE AND SCREEN
ABO/RH(D): O POS
Antibody Screen: NEGATIVE
Unit division: 0
Unit division: 0

## 2017-08-31 LAB — PROCALCITONIN: Procalcitonin: 29.71 ng/mL

## 2017-08-31 LAB — URINE CULTURE
CULTURE: NO GROWTH
SPECIAL REQUESTS: NORMAL

## 2017-08-31 LAB — RENAL FUNCTION PANEL
ANION GAP: 15 (ref 5–15)
Albumin: 1.7 g/dL — ABNORMAL LOW (ref 3.5–5.0)
BUN: 44 mg/dL — AB (ref 6–20)
CHLORIDE: 96 mmol/L — AB (ref 101–111)
CO2: 32 mmol/L (ref 22–32)
Calcium: 6.3 mg/dL — CL (ref 8.9–10.3)
Creatinine, Ser: 2.87 mg/dL — ABNORMAL HIGH (ref 0.44–1.00)
GFR calc Af Amer: 17 mL/min — ABNORMAL LOW (ref >60)
GFR calc non Af Amer: 15 mL/min — ABNORMAL LOW (ref >60)
Glucose, Bld: 104 mg/dL — ABNORMAL HIGH (ref 65–99)
PHOSPHORUS: 4.5 mg/dL (ref 2.5–4.6)
POTASSIUM: 3 mmol/L — AB (ref 3.5–5.1)
Sodium: 143 mmol/L (ref 135–145)

## 2017-08-31 LAB — CREATININE, SERUM
CREATININE: 2.84 mg/dL — AB (ref 0.44–1.00)
GFR, EST AFRICAN AMERICAN: 18 mL/min — AB (ref >60)
GFR, EST NON AFRICAN AMERICAN: 15 mL/min — AB (ref >60)

## 2017-08-31 LAB — MAGNESIUM: MAGNESIUM: 1 mg/dL — AB (ref 1.7–2.4)

## 2017-08-31 LAB — HEPARIN LEVEL (UNFRACTIONATED): Heparin Unfractionated: <0.1 IU/mL — ABNORMAL LOW (ref 0.30–0.70)

## 2017-08-31 LAB — OCCULT BLOOD X 1 CARD TO LAB, STOOL: FECAL OCCULT BLD: POSITIVE — AB

## 2017-08-31 MED ORDER — POTASSIUM CHLORIDE 10 MEQ/100ML IV SOLN
10.0000 meq | INTRAVENOUS | Status: AC
  Administered 2017-08-31 (×5): 10 meq via INTRAVENOUS
  Filled 2017-08-31 (×5): qty 100

## 2017-08-31 MED ORDER — VANCOMYCIN HCL IN DEXTROSE 750-5 MG/150ML-% IV SOLN
750.0000 mg | Freq: Once | INTRAVENOUS | Status: AC
  Administered 2017-08-31: 750 mg via INTRAVENOUS
  Filled 2017-08-31: qty 150

## 2017-08-31 MED ORDER — MORPHINE SULFATE (PF) 4 MG/ML IV SOLN
1.0000 mg | Freq: Once | INTRAVENOUS | Status: AC
  Administered 2017-08-31: 1 mg via INTRAVENOUS
  Filled 2017-08-31: qty 1

## 2017-08-31 MED ORDER — SODIUM CHLORIDE 0.9 % IV SOLN
2.0000 g | Freq: Once | INTRAVENOUS | Status: AC
  Administered 2017-08-31: 2 g via INTRAVENOUS
  Filled 2017-08-31: qty 20

## 2017-08-31 MED ORDER — MAGNESIUM SULFATE 4 GM/100ML IV SOLN
4.0000 g | Freq: Once | INTRAVENOUS | Status: AC
  Administered 2017-08-31: 4 g via INTRAVENOUS
  Filled 2017-08-31: qty 100

## 2017-08-31 MED ORDER — DEXTROSE-NACL 5-0.45 % IV SOLN
INTRAVENOUS | Status: DC
  Administered 2017-08-31 – 2017-09-03 (×2): via INTRAVENOUS

## 2017-08-31 NOTE — Progress Notes (Addendum)
Pharmacy - Brief Note (Vancomycin)  Patient given vancomycin 1gm IV x 1 10/25 at 17:00 (est Cmax = 62ml/min) - SCr worsening, anuric   Plan:  Since estimated Cmax of 1gm dose given yesterday within range to re-dose, will order 750mg  IV x 1 today with plans to check random level on Sunday  Doreene Eland, PharmD, BCPS.   Pager: [phone] 08/31/2017 9:24 AM

## 2017-08-31 NOTE — Progress Notes (Signed)
eLink Physician-Brief Progress Note Patient Name: Kimberly Castaneda DOB: [birth year] MRN: [id number]   Date of Service  08/31/2017  HPI/Events of Note  Hypomag, hypokalemia and hypocalcemia  eICU Interventions  Mag, Potassium replaced Calcium ordered     Intervention Category Intermediate Interventions: Electrolyte abnormality - evaluation and management  DETERDING,ELIZABETH 08/31/2017, 4:41 AM

## 2017-08-31 NOTE — Progress Notes (Signed)
[facility] Progress Note     Subjective: CC: confusion Patient states she feels confused and wants to get out of bed and get moving. She denies abdominal pain or nausea. UOP low. VSS.   Objective: Vital signs in last 24 hours: Temp:  [96 F (35.6 C)-98.4 F (36.9 C)] 98.1 F (36.7 C) (10/26 0500) Pulse Rate:  [88-105] 100 (10/26 0400) Resp:  [13-31] 18 (10/26 0400) BP: (60-159)/(43-98) 152/71 (10/26 0400) SpO2:  [94 %-100 %] 96 % (10/26 0400) Weight:  [78.6 kg (173 lb 4.5 oz)] 78.6 kg (173 lb 4.5 oz) (10/26 0500) Last BM Date: 08/29/17  Intake/Output from previous day: 10/25 0701 - 10/26 0700 In: 1829 [P.O.:120; I.V.:105; Blood:630; IV Piggyback:450] Out: 2155 [Urine:55; Emesis/NG output:2100] Intake/Output this shift: No intake/output data recorded.  PE: Gen:   NAD, pleasant Card:  Regular rate and rhythm, pedal pulses 2+ BL Pulm:  Normal effort, clear to auscultation bilaterally Abd: Soft, non-tender, non-distended, bowel sounds present but hypoactive, no HSM Skin: warm and dry, no rashes  Psych: patient disoriented but cooperative  Lab Results:   Recent Labs  08/30/17 0425  08/31/17 0010 08/31/17 0331  WBC 35.8*  --   --  44.2*  HGB 6.3*  < > 9.1* 8.8*  8.8*  HCT 18.7*  < > 25.4* 24.5*  24.6*  PLT 566*  --   --  380  < > = values in this interval not displayed. BMET  Recent Labs  08/30/17 0425 08/31/17 0331  NA 139 143  K 3.5 3.0*  CL 94* 96*  CO2 24 32  GLUCOSE 167* 104*  BUN 36* 44*  CREATININE 2.16* 2.87*  2.84*  CALCIUM 7.0* 6.3*   PT/INR No results for input(s): LABPROT, INR in the last 72 hours. CMP     Component Value Date/Time   NA 143 08/31/2017 0331   NA 135 07/05/2017 1115   K 3.0 (L) 08/31/2017 0331   CL 96 (L) 08/31/2017 0331   CO2 32 08/31/2017 0331   GLUCOSE 104 (H) 08/31/2017 0331   BUN 44 (H) 08/31/2017 0331   BUN 34 (H) 07/05/2017 1115   CREATININE 2.87 (H) 08/31/2017 0331   CREATININE 2.84 (H)  08/31/2017 0331   CREATININE 1.37 (H) 04/21/2013 0844   CALCIUM 6.3 (LL) 08/31/2017 0331   PROT 4.9 (L) 08/25/2017 0727   ALBUMIN 1.7 (L) 08/31/2017 0331   AST 17 08/25/2017 0727   ALT 10 (L) 08/25/2017 0727   ALKPHOS 110 08/25/2017 0727   BILITOT 0.6 08/25/2017 0727   GFRNONAA 15 (L) 08/31/2017 0331   GFRNONAA 15 (L) 08/31/2017 0331   GFRAA 17 (L) 08/31/2017 0331   GFRAA 18 (L) 08/31/2017 0331   Lipase     Component Value Date/Time   LIPASE 23 11/21/2010 1802       Studies/Results: Dg Abd 1 View  Result Date: 08/31/2017 CLINICAL DATA:  Nasogastric tube placement.  Initial encounter. EXAM: ABDOMEN - 1 VIEW COMPARISON:  Abdominal radiograph performed 08/30/2017 FINDINGS: The patient's enteric tube is seen ending overlying the pylorus or first segment of the duodenum. The stomach is partially filled with air. No free intra-abdominal air is seen, though evaluation for free air is limited on a single supine view. No acute osseous abnormalities are identified. Flowing osteophytes are seen along the lower thoracic and upper lumbar spine. IMPRESSION: Enteric tube noted ending overlying the pylorus or first segment of the duodenum. Electronically Signed   By: Garald Balding M.D.   On:  08/31/2017 01:54   US Renal  Result Date: 08/30/2017 CLINICAL DATA:  Oliguria for today. EXAM: RENAL / URINARY TRACT ULTRASOUND COMPLETE COMPARISON:  None. FINDINGS: Right Kidney: Length: 9.4 cm. Echogenicity within normal limits. There is cortical thinning. No mass or hydronephrosis visualized. Left Kidney: Length: 10.4 cm. Echogenicity within normal limits. There is cortical thinning. No mass or hydronephrosis visualized. Bladder: The bladder is decompressed with question Foley catheter in place. Trace ascites and right pleural effusion are noted. There are gallstones and sludge in the gallbladder. There is a complex masslike lesion in the midline to left pelvis. IMPRESSION: Bilateral renal cortical thinning.  No acute abnormalities identified in the kidney. Gallstones and sludge within the gallbladder. There is a question complex masslike lesion in the midline to left pelvis. Trace ascites and right pleural effusion noted. Electronically Signed   By: Abelardo Diesel M.D.   On: 08/30/2017 21:41   Dg Chest Port 1 View  Result Date: 08/30/2017 CLINICAL DATA:  Cough, recent small bowel obstruction, diabetes EXAM: PORTABLE CHEST 1 VIEW COMPARISON:  CT chest of 08/23/2017 and chest x-ray of 08/24/2017 FINDINGS: There is peripheral opacity in the right mid lung suspicious for a patchy area of pneumonia. The left lung is clear. A tiny right pleural effusion cannot be excluded. Mediastinal and hilar contours are unremarkable and the heart remains within upper limits of normal. NG tube extends below the hemidiaphragm. IMPRESSION: 1. Peripheral opacity in the right mid lung may represent a small focus of pneumonia. 2. Cannot exclude a tiny right pleural effusion. Electronically Signed   By: Ivar Drape M.D.   On: 08/30/2017 15:54   Dg Abd Portable 1v  Result Date: 08/31/2017 CLINICAL DATA:  Gastric distention . EXAM: PORTABLE ABDOMEN - 1 VIEW COMPARISON:  08/31/2017 . FINDINGS: Interim removal of NG tube. Mild gastric distention noted. No evidence of small bowel distention. No free air. Vascular calcification. Degenerative changes lumbar spine and both hips. IMPRESSION: Interim removal of NG tube.  Mild gastric distention noted. Electronically Signed   By: Marcello Moores  Register   On: 08/31/2017 06:46   Dg Abd Portable 1v  Result Date: 08/30/2017 CLINICAL DATA:  76 year old female with sudden onset nausea vomiting. EXAM: PORTABLE ABDOMEN - 1 VIEW COMPARISON:  Abdominal CT dated 08/23/2017 and ultrasound dated 08/25/2017 FINDINGS: There is air distention of the stomach with relative paucity of the small bowel gas. Findings may be related to dysmotility of the stomach or a degree of gastric outlet obstruction. Clinical  correlation and follow-up recommended. There is no date of the small bowel. Multiple pockets of air noted in the right colon. A pocket of air and soft tissue density over the pelvis likely corresponds to the necrotic mass seen on the prior CT. No free air identified on the provided image. There is osteopenia with degenerative changes of the spine and hips. No acute osseous pathology. IMPRESSION: 1. Moderate air distention of the stomach with paucity of small bowel gas may represent gastric dysmotility versus less likely gastric outlet obstruction. 2. No dilatation of the small bowel or evidence of small-bowel obstruction. 3. Soft tissue opacity with pocket of air in the pelvis corresponding to the necrotic mass seen on the prior CT. Electronically Signed   By: Anner Crete M.D.   On: 08/30/2017 01:24    Anti-infectives: Anti-infectives    Start     Dose/Rate Route Frequency Ordered Stop   08/30/17 1700  vancomycin (VANCOCIN) IVPB 1000 mg/200 mL premix     1,000  mg 200 mL/hr over 60 Minutes Intravenous  Once 08/30/17 1543 08/30/17 1809   08/30/17 1600  cefTAZidime (FORTAZ) 2 g in dextrose 5 % 50 mL IVPB     2 g 100 mL/hr over 30 Minutes Intravenous Every 24 hours 08/30/17 1543     08/30/17 1000  ampicillin-sulbactam (UNASYN) 1.5 g in sodium chloride 0.9 % 50 mL IVPB  Status:  Discontinued     1.5 g 100 mL/hr over 30 Minutes Intravenous 2 times daily 08/30/17 0731 08/30/17 1511   08/30/17 0015  Ampicillin-Sulbactam (UNASYN) 3 g in sodium chloride 0.9 % 100 mL IVPB  Status:  Discontinued     3 g 200 mL/hr over 30 Minutes Intravenous 2 times daily 08/30/17 0005 08/30/17 0731   08/28/17 2000  amoxicillin-clavulanate (AUGMENTIN) 500-125 MG per tablet 500 mg  Status:  Discontinued     1 tablet Oral Every 12 hours 08/28/17 1134 08/30/17 0008   08/26/17 0600  ampicillin-sulbactam (UNASYN) 1.5 g in sodium chloride 0.9 % 50 mL IVPB  Status:  Discontinued     1.5 g 100 mL/hr over 30 Minutes Intravenous  Every 24 hours 08/25/17 1027 08/28/17 1117   08/24/17 1800  ampicillin-sulbactam (UNASYN) 1.5 g in sodium chloride 0.9 % 50 mL IVPB  Status:  Discontinued     1.5 g 100 mL/hr over 30 Minutes Intravenous Every 12 hours 08/24/17 0818 08/25/17 1027   08/23/17 2000  ampicillin-sulbactam (UNASYN) 1.5 g in sodium chloride 0.9 % 50 mL IVPB  Status:  Discontinued     1.5 g 100 mL/hr over 30 Minutes Intravenous Every 6 hours 08/23/17 1807 08/24/17 0818       Assessment/Plan Acute on chronic kidney disease stage II/non-anion gap metabolic acidosis Acute metabolic encephalopathy Uterine/cervical mass - bx 10/22, path pending Syncope Acute LLE DVT - heparin being held due to ABL anemia Aspiration PNA - Unasyn Leukocytosis - trending up, 44.2 this AM Hypercalcemia Hypokalemia Non-ischemic cardiomyopathy/Chronic diastolic CHF Type 2 DM - SSI HTN - currently hypotensive, likely due to low hgb ABL anemia imposed on chronic anemia - hgb 8.8 this AM  Nausea and Vomiting - XR 10/25: Moderate air distention of the stomach with paucity of small bowel gas may represent gastric dysmotility versus less likely gastric outlet obstruction - this is more likely gastric ileus, would be unlikely for patient to have developed GOO with CT on 10/18 showing no signs of this - NGT - 2100 cc output in 24h; patient removed NGT last night - do not need to replace NGT unless patient actively vomiting or becomes distended again  - XR this AM showed mild gastric distention  - consider contrasted study in a few days vs endoscopy - consider GI consult - bowel rest - no indications for acute surgical intervention  FEN: NPO, IVF; VTE: holding heparin due to low hgb;  ID: IV unasyn (10/25>>) Foley: 10/19  We will sign off. Please re-consult if new issues arise. Call with questions/concerns.   LOS: 9 days    Brigid Re , [facility] 08/31/2017, 8:02 AM Pager: [phone] Consults:  [phone] Mon-Fri 7:00 am-4:30 pm Sat-Sun 7:00 am-11:30 am

## 2017-08-31 NOTE — Progress Notes (Signed)
PCCM Attending Note:  Patient's BP has stabilized. Hemoglobin stable post transfusion. Defer to Dr. Doyle Askew in further management of this patient. We will be available as needed.  Sonia Baller Ashok Cordia, M.D. [facility] Pulmonary & Critical Care Pager:  [phone] After 7pm or if no response, call [phone] 12:46 PM 08/31/17

## 2017-08-31 NOTE — Progress Notes (Signed)
CRITICAL VALUE ALERT  Critical Value: calcium 6.3  Date & Time Notied:  08/31/2017 @ 4:30am  Provider Notified: X. Blount   Orders Received/Actions taken: waiting for orders

## 2017-08-31 NOTE — Progress Notes (Signed)
CSW is following to assist with patient discharge once medically stable.   Kathrin Greathouse, Latanya Presser, MSW Clinical Social Worker  [phone] 08/31/2017  4:10 PM

## 2017-08-31 NOTE — Progress Notes (Signed)
Pt is confused, and agitated and constantly pulling at foley, IV lines, leads etc. Pt was able pull out NG tube and will not allow me to place a new one. Will try again later.

## 2017-08-31 NOTE — Progress Notes (Signed)
Patient ID: Kimberly Castaneda, female   DOB: February 28, 1941, 76 y.o.   MRN: [id number]    PROGRESS NOTE  Kimberly Castaneda  MPN:[id number] DOB: [birth year] DOA: 08/19/2017  PCP: [name], MD   Brief Narrative:  Patient is 76 year old female with known and chronic diastolic CHF, nonischemic cardiomyopathy, hypertension, chronic leukocytosis, presented after an episode of near syncope. Patient was brought into the hospital day after syncopal event and was found to have hypercalcemia, leukocytosis, DVT in the left lower extremity. Patient was seen by oncology, underwent CT scan of the chest abdomen and pelvis which raises concern for uterine mass. Patient subsequently developed acute renal failure due to contrast, nephrology was consulted but has currently signed off his creatinine function has improved. Patient also underwent biopsy of the uterine/cervical mass on Monday, 08/27/2017.  Overnight 10/24 to 10/25, pt with multiple episodes of vomiting, more confusion and hypotension, SBP in 70's, required transfer to SDU 08/30/2017 am, blood work also notable for drop in Hg ~6 and 2 U PRBC ordered for transfusion. ABd XRAY worrisome for GOO. Surgery and PCCM consulted for assistance.   Assessment & Plan: Nausea and vomiting 10/24 - 10/25 - in pt with recent finding of uterine/vervical mass concerning for malignancy - initial concern for GOO - surgery consulted, NG tube was placed on October 25 - Patient has removed NG tube this morning, per surgery, patient overall improving without any evidence of mechanical gastric outlet obstruction - Allowing clear liquid diet - Repeat x-ray in the morning - if persistent vomiting, we'll consider gastroenterologist consultation  Hypotension 10/25, septic shock 08/30/2017, ? RML PNA - elevated lactic acid, pro calcitonin - Further drop in blood pressure exacerbated by acute blood loss anemia - Blood cultures have been obtained, currently no growth to date -  patient had antibiotics broadened to vancomycin and Fortaz, we'll continue same regimen day #2 and narrow down as clinically indicated - Patient has received 2 units of PRBC October 25 with appropriate increase in posttransfusion hemoglobin - Please note that anticoagulation has been held - I will continue to hold anticoagulation today, repeat CBC in the morning - If hemoglobin remains stable, may consider resuming anticoagulation in next 24-48 hours  Acute on chronic kidney disease stage II/non-anion gap metabolic acidosis - suspected to be contrast-induced - Overall creatinine was trending down but overnight due to vomiting and hypotension, Cr up again 10/25 - initial Renal ultrasound did not show any signs of hydronephrosis, repeat ultrasound 08/30/2017, no evidence of obstruction - Nephrology team signed off 10/23 - creatinine still elevated this morning, I suspect this is related to hypotension, hypoperfusion,septic shock - Continue IV fluids and repeat BMP in the morning  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 Denman George - Recommendation is to obtain PET/CT scan but this will be scheduled as an outpatient - Dr. [place] Pink discussed with Dr. Lebron Conners, recommend follow-up with Dr. Alvy Bimler in an outpatient setting - I have discussed pathology report with Dr. Saralyn Pilar, preliminary report high suspicion for cervical, endometrial adenocarcinoma, poorly differentiated - I have discussed these findings with husband - I have also paged on-call GYN oncology for further recommendations  Acute metabolic encephalopathy - due to septic shock, above-mentioned illnesses - Still intermittently confused but overall better  Near syncope - Etiology not entirely clear, suspected dehydration - No recurrence since admission  Acute Left lower extremity  DVT. - initially placed on Lovenox but due to worsening renal function, changed to heparin - anticoagulation discontinued due to acute blood loss anemia as noted above - May be able to resume based on overall hemoglobin stability  Aspiration pneumonia - has been on Augmentin initially however, 10/25 developed septic shock, suspicion of new right middle lobe pneumonia - Antibiotics have been broadened to vancomycin and Fortaz as noted above, continued day #2  Leukocytosis. - still elevated, etiology not entirely clear - Has been treated appropriately with antibiotics and despite this - could be related to underlying malignancy  Hypocalcemia, hypokalemia, hypomagnesemia - supplement K, MG, repeat electrolytes in the morning  History of nonischemic cardiomyopathy/chronic diastolic dysfunction. - echo results outlined below  Diabetes mellitus type 2. - continue sliding scale insulin  History of essential hypertension - antihypertensive regimen on hold  Normocytic anemia. Acute blood loss anemia imposed on chronic anemia  - has been transfused 1 unit of blood on 08/24/2017 - 2 units PRBC transfused 08/30/2017 - CBC in the morning  DVT prophylaxis: IV heparin stopped, changed to SCD Code Status: full code Family Communication: updated husband over the phone Disposition Plan: keep in step down unit  Consultants:   Medical oncology  GYN oncology  Nephrology - signed off 10/23  PCCM  Surgery   Pathologist Dr. Saralyn Pilar over the phone  Procedures:   Left lower extremity venous Doppler, positive for acute DVT in the posterior tibial vein  Transthoracic echo EF 55-60% with grade 1 diastolic CHF  Antimicrobials:   Unasyn initiated on 08/23/2017, transition to oral Augmentin 08/28/2017 --> 10/24  Unasyn 10/24 --> 10/25  Vancomycin 10/25 -->  Fortaz 10/25 -->  Subjective: Patient appears more alert this morning however, still intermittently  confused  Objective: Vitals:   08/31/17 0300 08/31/17 0400 08/31/17 0500 08/31/17 0800  BP: (!) 158/78 (!) 152/71  (!) 156/69  Pulse:  100    Resp: 19 18  15   Temp: 97.9 F (36.6 C) 98.1 F (36.7 C) 98.1 F (36.7 C) (!) 97.5 F (36.4 C)  TempSrc:    Core (Comment)  SpO2:  96%  96%  Weight:   78.6 kg (173 lb 4.5 oz)   Height:        Intake/Output Summary (Last 24 hours) at 08/31/17 1416 Last data filed at 08/31/17 1000  Gross per 24 hour  Intake          1243.33 ml  Output              505 ml  Net           738.33 ml   Filed Weights   08/29/17 0500 08/30/17 0800 08/31/17 0500  Weight: 63.4 kg (139 lb 12.4 oz) 63.7 kg (140 lb 6.9 oz) 78.6 kg (173 lb 4.5 oz)   Physical Exam  Constitutional: Appears alert, not in acute distress CVS: RRR, S1/S2 +, no murmurs, no gallops, no carotid bruit.  Pulmonary: Effort and breath sounds normal, no stridor, rhonchi at bases Abdominal: Soft. BS +,  no distension, tenderness Musculoskeletal: Normal range of motion. No edema and no tenderness.   Data Reviewed: I have personally reviewed following labs and imaging studies  CBC:  Recent Labs Lab 08/27/17 0443 08/28/17 0401 08/29/17 0502 08/30/17 0425  08/30/17 2006 08/31/17 0010 08/31/17 0331 08/31/17 0903 08/31/17 1201  WBC 28.5* 22.9* 25.9* 35.8*  --   --   --  44.2*  --   --   NEUTROABS  --   --   --   --   --   --   --  39.4*  --   --   HGB 8.3* 7.8* 8.1* 6.3*  < > 9.4* 9.1* 8.8*  8.8* 8.6* 9.0*  HCT 24.1* 23.5* 23.9* 18.7*  < > 26.7* 25.4* 24.5*  24.6* 24.4* 26.3*  MCV 86.7 86.4 86.6 87.0  --   --   --  84.5  --   --   PLT 458* 472* 488* 566*  --   --   --  380  --   --   < > = values in this interval not displayed. Basic Metabolic Panel:  Recent Labs Lab 08/27/17 0443 08/28/17 0401 08/29/17 0502 08/30/17 0425 08/31/17 0331  NA 131* 134*  134* 135 139 143  K 4.0 3.5  3.5 3.3* 3.5 3.0*  CL 105 107  107 101 94* 96*  CO2 14* 17*  17* 23 24 32  GLUCOSE 118*  126*  127* 131* 167* 104*  BUN 37* 37*  37* 29* 36* 44*  CREATININE 3.57* 2.56*  2.57* 1.83* 2.16* 2.87*  2.84*  CALCIUM 7.5* 7.4*  7.4* 6.8* 7.0* 6.3*  MG  --   --   --   --  1.0*  PHOS  --  2.6  --   --  4.5   Liver Function Tests:  Recent Labs Lab 08/25/17 0727 08/28/17 0401 08/31/17 0331  AST 17  --   --   ALT 10*  --   --   ALKPHOS 110  --   --   BILITOT 0.6  --   --   PROT 4.9*  --   --   ALBUMIN 1.7* 1.6* 1.7*   CBG:  Recent Labs Lab 08/30/17 2055 08/30/17 2307 08/31/17 0344 08/31/17 0752 08/31/17 1255  GLUCAP 143* 121* 103* 116* 119*   Urine analysis:    Component Value Date/Time   COLORURINE YELLOW 08/26/2017 1654   APPEARANCEUR CLOUDY (A) 08/26/2017 1654   LABSPEC 1.029 08/26/2017 1654   PHURINE 5.0 08/26/2017 1654   GLUCOSEU NEGATIVE 08/26/2017 1654   HGBUR MODERATE (A) 08/26/2017 1654   BILIRUBINUR NEGATIVE 08/26/2017 1654   KETONESUR 5 (A) 08/26/2017 1654   PROTEINUR 100 (A) 08/26/2017 1654   UROBILINOGEN 1.0 11/21/2010 2342   NITRITE NEGATIVE 08/26/2017 1654   LEUKOCYTESUR LARGE (A) 08/26/2017 1654   Recent Results (from the past 240 hour(s))  Culture, Urine     Status: None   Collection Time: 08/26/17  6:44 PM  Result Value Ref Range Status   Specimen Description URINE, RANDOM  Final   Special Requests NONE  Final   Culture   Final    NO GROWTH Performed at [facility], [address], [place], [place] [address]    Report Status 08/28/2017 FINAL  Final  MRSA PCR Screening     Status: None   Collection Time: 08/30/17  8:07 AM  Result Value Ref Range Status   MRSA by PCR NEGATIVE NEGATIVE Final    Comment:        The GeneXpert MRSA Assay (FDA approved for NASAL specimens only), is one component of a comprehensive MRSA colonization surveillance program. It is not intended to diagnose MRSA infection nor to guide or monitor treatment for MRSA infections.   Culture, blood (routine x 2)     Status: None (Preliminary result)    Collection Time: 08/30/17  4:07 PM  Result Value Ref Range Status   Specimen Description BLOOD RIGHT ARM  Final   Special Requests IN PEDIATRIC BOTTLE Blood Culture adequate volume  Final   Culture   Final    NO GROWTH < 24 HOURS Performed at [facility], [place] [address] [place], [place] [address]    Report Status PENDING  Incomplete  Culture, blood (routine x 2)     Status: None (Preliminary result)   Collection Time: 08/30/17  4:19 PM  Result Value Ref Range Status   Specimen Description BLOOD RIGHT HAND  Final   Special Requests IN PEDIATRIC BOTTLE Blood Culture adequate volume  Final   Culture   Final    NO GROWTH < 24 HOURS Performed at [facility], [place] [address] [place], [place] [address]    Report Status PENDING  Incomplete    Radiology Studies: Dg Abd 1 View  Result Date: 08/31/2017 CLINICAL DATA:  Nasogastric tube placement.  Initial encounter. EXAM: ABDOMEN - 1 VIEW COMPARISON:  Abdominal radiograph performed 08/30/2017 FINDINGS: The patient's enteric tube is seen ending overlying the pylorus or first segment of the duodenum. The stomach is partially filled with air. No free intra-abdominal air is seen, though evaluation for free air is limited on a single supine view. No acute osseous abnormalities are identified. Flowing osteophytes are seen along the lower thoracic and upper lumbar spine. IMPRESSION: Enteric tube noted ending overlying the pylorus or first segment of the duodenum. Electronically Signed   By: Garald Balding M.D.   On: 08/31/2017 01:54   US Renal  Result Date: 08/30/2017 CLINICAL DATA:  Oliguria for today. EXAM: RENAL / URINARY TRACT ULTRASOUND COMPLETE COMPARISON:  None. FINDINGS: Right Kidney: Length: 9.4 cm. Echogenicity within normal limits. There is cortical thinning. No mass or hydronephrosis visualized. Left Kidney: Length: 10.4 cm. Echogenicity within normal limits. There is cortical thinning. No mass or hydronephrosis visualized.  Bladder: The bladder is decompressed with question Foley catheter in place. Trace ascites and right pleural effusion are noted. There are gallstones and sludge in the gallbladder. There is a complex masslike lesion in the midline to left pelvis. IMPRESSION: Bilateral renal cortical thinning. No acute abnormalities identified in the kidney. Gallstones and sludge within the gallbladder. There is a question complex masslike lesion in the midline to left pelvis. Trace ascites and right pleural effusion noted. Electronically Signed   By: Abelardo Diesel M.D.   On: 08/30/2017 21:41   Dg Chest Port 1 View  Result Date: 08/30/2017 CLINICAL DATA:  Cough, recent small bowel obstruction, diabetes EXAM: PORTABLE CHEST 1 VIEW COMPARISON:  CT chest of 08/23/2017 and chest x-ray of 08/22/2017 FINDINGS: There is peripheral opacity in the right mid lung suspicious for a patchy area of pneumonia. The left lung is clear. A tiny right pleural effusion cannot be excluded. Mediastinal and hilar contours are unremarkable and the heart remains within upper limits of normal. NG tube extends below the hemidiaphragm. IMPRESSION: 1. Peripheral opacity in the right mid lung may represent a small focus of pneumonia. 2. Cannot exclude a tiny right pleural effusion. Electronically Signed   By: Ivar Drape M.D.   On: 08/30/2017 15:54   Dg Abd Portable 1v  Result Date: 08/31/2017 CLINICAL DATA:  Gastric distention . EXAM: PORTABLE ABDOMEN - 1 VIEW COMPARISON:  08/31/2017 . FINDINGS: Interim removal of NG tube. Mild gastric distention noted. No evidence of small bowel distention. No free air. Vascular calcification. Degenerative changes lumbar spine and both hips. IMPRESSION: Interim removal of NG tube.  Mild gastric distention noted. Electronically Signed   By: Marcello Moores  Register   On: 08/31/2017 06:46  Dg Abd Portable 1v  Result Date: 08/30/2017 CLINICAL DATA:  76 year old female with sudden onset nausea vomiting. EXAM: PORTABLE ABDOMEN  - 1 VIEW COMPARISON:  Abdominal CT dated 08/23/2017 and ultrasound dated 08/25/2017 FINDINGS: There is air distention of the stomach with relative paucity of the small bowel gas. Findings may be related to dysmotility of the stomach or a degree of gastric outlet obstruction. Clinical correlation and follow-up recommended. There is no date of the small bowel. Multiple pockets of air noted in the right colon. A pocket of air and soft tissue density over the pelvis likely corresponds to the necrotic mass seen on the prior CT. No free air identified on the provided image. There is osteopenia with degenerative changes of the spine and hips. No acute osseous pathology. IMPRESSION: 1. Moderate air distention of the stomach with paucity of small bowel gas may represent gastric dysmotility versus less likely gastric outlet obstruction. 2. No dilatation of the small bowel or evidence of small-bowel obstruction. 3. Soft tissue opacity with pocket of air in the pelvis corresponding to the necrotic mass seen on the prior CT. Electronically Signed   By: Anner Crete M.D.   On: 08/30/2017 01:24    Scheduled Meds: . insulin aspart  0-9 Units Subcutaneous Q4H  . pantoprazole (PROTONIX) IV  40 mg Intravenous Q12H  . potassium chloride  20 mEq Oral Once   Continuous Infusions: . cefTAZidime (FORTAZ)  IV Stopped (08/30/17 1706)  . dextrose 5 % 850 mL with sodium bicarbonate 150 mEq infusion Stopped (08/30/17 0952)  . phenylephrine (NEO-SYNEPHRINE) Adult infusion Stopped (08/30/17 0950)  . vancomycin       LOS: 9 days   Time spent: 35 minutes   Faye Ramsay, MD [facility] Pager [phone]  If 7PM-7AM, please contact night-coverage [email or web address] Password [email or web address] 08/31/2017, 2:16 PM

## 2017-09-01 LAB — HEMOGLOBIN AND HEMATOCRIT, BLOOD
HCT: 24.1 % — ABNORMAL LOW (ref 36.0–46.0)
HCT: 23.7 % — ABNORMAL LOW (ref 36.0–46.0)
HCT: 24.7 % — ABNORMAL LOW (ref 36.0–46.0)
HEMATOCRIT: 24.5 % — AB (ref 36.0–46.0)
HEMOGLOBIN: 8.6 g/dL — AB (ref 12.0–15.0)
Hemoglobin: 8.3 g/dL — ABNORMAL LOW (ref 12.0–15.0)
Hemoglobin: 8.2 g/dL — ABNORMAL LOW (ref 12.0–15.0)
Hemoglobin: 8.5 g/dL — ABNORMAL LOW (ref 12.0–15.0)

## 2017-09-01 LAB — RENAL FUNCTION PANEL
ANION GAP: 15 (ref 5–15)
Albumin: 1.7 g/dL — ABNORMAL LOW (ref 3.5–5.0)
BUN: 39 mg/dL — ABNORMAL HIGH (ref 6–20)
CALCIUM: 7.2 mg/dL — AB (ref 8.9–10.3)
CHLORIDE: 96 mmol/L — AB (ref 101–111)
CO2: 28 mmol/L (ref 22–32)
Creatinine, Ser: 2.12 mg/dL — ABNORMAL HIGH (ref 0.44–1.00)
GFR calc non Af Amer: 22 mL/min — ABNORMAL LOW (ref >60)
GFR, EST AFRICAN AMERICAN: 25 mL/min — AB (ref >60)
Glucose, Bld: 140 mg/dL — ABNORMAL HIGH (ref 65–99)
Phosphorus: 3.1 mg/dL (ref 2.5–4.6)
Potassium: 3.7 mmol/L (ref 3.5–5.1)
Sodium: 139 mmol/L (ref 135–145)

## 2017-09-01 LAB — GLUCOSE, CAPILLARY
GLUCOSE-CAPILLARY: 105 mg/dL — AB (ref 65–99)
GLUCOSE-CAPILLARY: 111 mg/dL — AB (ref 65–99)
GLUCOSE-CAPILLARY: 114 mg/dL — AB (ref 65–99)
GLUCOSE-CAPILLARY: 122 mg/dL — AB (ref 65–99)
GLUCOSE-CAPILLARY: 162 mg/dL — AB (ref 65–99)
Glucose-Capillary: 125 mg/dL — ABNORMAL HIGH (ref 65–99)

## 2017-09-01 LAB — CBC WITH DIFFERENTIAL/PLATELET
BASOS ABS: 0 10*3/uL (ref 0.0–0.1)
Basophils Relative: 0 %
Eosinophils Absolute: 0 10*3/uL (ref 0.0–0.7)
Eosinophils Relative: 0 %
HCT: 24.9 % — ABNORMAL LOW (ref 36.0–46.0)
Hemoglobin: 8.7 g/dL — ABNORMAL LOW (ref 12.0–15.0)
LYMPHS ABS: 1.8 10*3/uL (ref 0.7–4.0)
Lymphocytes Relative: 5 %
MCH: 30.3 pg (ref 26.0–34.0)
MCHC: 34.9 g/dL (ref 30.0–36.0)
MCV: 86.8 fL (ref 78.0–100.0)
MONO ABS: 1.1 10*3/uL — AB (ref 0.1–1.0)
MONOS PCT: 3 %
Neutro Abs: 33 10*3/uL — ABNORMAL HIGH (ref 1.7–7.7)
Neutrophils Relative %: 92 %
PLATELETS: 302 10*3/uL (ref 150–400)
RBC: 2.87 MIL/uL — AB (ref 3.87–5.11)
RDW: 15.4 % (ref 11.5–15.5)
WBC: 35.9 10*3/uL — AB (ref 4.0–10.5)

## 2017-09-01 LAB — MAGNESIUM: Magnesium: 1.8 mg/dL (ref 1.7–2.4)

## 2017-09-01 LAB — CREATININE, SERUM
CREATININE: 2.1 mg/dL — AB (ref 0.44–1.00)
GFR calc Af Amer: 25 mL/min — ABNORMAL LOW (ref >60)
GFR, EST NON AFRICAN AMERICAN: 22 mL/min — AB (ref >60)

## 2017-09-01 LAB — PROCALCITONIN: Procalcitonin: 12.88 ng/mL

## 2017-09-01 MED ORDER — VITAMINS A & D EX OINT
TOPICAL_OINTMENT | CUTANEOUS | Status: AC
  Administered 2017-09-01: 21:00:00
  Filled 2017-09-01: qty 5

## 2017-09-01 MED ORDER — HYDRALAZINE HCL 20 MG/ML IJ SOLN
5.0000 mg | INTRAMUSCULAR | Status: DC | PRN
  Administered 2017-09-01: 5 mg via INTRAVENOUS
  Filled 2017-09-01: qty 1

## 2017-09-01 MED ORDER — HYDRALAZINE HCL 20 MG/ML IJ SOLN
10.0000 mg | INTRAMUSCULAR | Status: DC | PRN
  Administered 2017-09-01 – 2017-09-02 (×2): 10 mg via INTRAVENOUS
  Filled 2017-09-01 (×2): qty 1

## 2017-09-01 MED ORDER — ORAL CARE MOUTH RINSE
15.0000 mL | Freq: Two times a day (BID) | OROMUCOSAL | Status: DC
  Administered 2017-09-01 – 2017-09-06 (×9): 15 mL via OROMUCOSAL

## 2017-09-01 MED ORDER — PROMETHAZINE HCL 25 MG/ML IJ SOLN: 12.5000 mg | INTRAMUSCULAR | Status: DC | PRN

## 2017-09-01 NOTE — Progress Notes (Signed)
Central Telemetry called with runs of [facility] noted on monitor. Patient is asleep currently with VSS. See flow sheet for further.

## 2017-09-01 NOTE — Consult Note (Signed)
[facility] CONSULT Reason for consult: G.I. bleeding Referring Physician: [facility] hospitalist. PCP: Dr. [name].  Kimberly Castaneda is an 76 y.o. female.  HPI: she was admitted with an episode of syncope. She was brought in by her husband. She has a history of nonischemic cardiomyopathy and type II diabetes with associated CKD. The patient's hospital course has been somewhat complicated. She was found to be hypercalcemic for unclear reasons and was seen by oncology who ordered CT scan that showed large mass in the uterus consistent with cancer. She was seen by Dr. Denman George, GYN oncologist, biopsy showed poorly differentiated adenocarcinoma. Unfortunately the contrast for this CT scan resulted in acute renal failure and she has been followed by Dr. Barry Dienes of nephrology. This was felt to be due to the contrast material on top of her diabetic CKD. This is beginning to improve. She was found to have a DVT and was started on anticoagulation. There was some shortness of breath and she was seen by critical care medicine who felt that she did not have a PE may have pneumonia. She has had 3 units of blood since admission but none over the last 2 days. She has had a dark melenic stools. She has been on IV Protonix. She is also being treated with antibiotics for pneumonia. We were asked to see her for her G.I. Bleeding. The patient is quite confused and is restrained in bed with mittens. She does respond it stated to me that she lives in [place] and is only in [place] visiting. She denied any pain or any other symptoms. I was unable to reach her husband but did reach her son who stated that she does in fact live in [place] and has for the past 30 years.  Past Medical History:  Diagnosis Date  . CKD (chronic kidney disease) stage 3, GFR 30-59 ml/min (HCC)   . Diabetes mellitus ([place])    type 2  . Hypercholesterolemia   . Mitral regurgitation   . Nonischemic cardiomyopathy ([place])   . Systemic  hypertension   . Vertigo     Past Surgical History:  Procedure Laterality Date  . CARDIAC CATHETERIZATION  07/18/2010   r/t nonsichemic cardiomyopathy; normal coronary anatomy; EF 20%;   . TONSILLECTOMY    . TUBAL LIGATION    . US ECHOCARDIOGRAPHY  04/03/2012   EF 55%; mild LVH    History reviewed. No pertinent family history.  Social History:  reports that she has never smoked. She has never used smokeless tobacco. She reports that she does not drink alcohol or use drugs.  Allergies:  Allergies  Allergen Reactions  . Erythromycin Nausea And Vomiting  . Quinapril Other (See Comments)    unknown  . Ciprofloxacin Hcl Nausea Only    Medications; Prior to Admission medications   Medication Sig Start Date End Date Taking? Authorizing Provider  alendronate (FOSAMAX) 70 MG tablet Take 70 mg by mouth once a week.  04/02/13  Yes [provider]  Ascorbic Acid (VITA-C PO) Take 1 tablet by mouth daily.   Yes [provider]  aspirin 81 MG tablet Take 81 mg by mouth daily.   Yes [provider]  atorvastatin (LIPITOR) 20 MG tablet Take 20 mg by mouth daily.   Yes [provider]  carvedilol (COREG) 12.5 MG tablet Take 1 tablet (12.5 mg total) by mouth 2 (two) times daily. Patient taking differently: Take 25 mg by mouth 2 (two) times daily.  07/05/17  Yes Erlene Quan, PA-C  cholecalciferol (VITAMIN D) 1000 UNITS tablet Take 1,000 Units by mouth daily.   Yes [provider]  fluticasone (FLONASE) 50 MCG/ACT nasal spray Place 1-2 sprays into both nostrils daily as needed for allergies.  02/17/13  Yes [provider]  hydrochlorothiazide (MICROZIDE) 12.5 MG capsule Take 12.5 mg by mouth daily.   Yes [provider]  linagliptin (TRADJENTA) 5 MG TABS tablet Take 5 mg by mouth daily.   Yes [provider]  OMEGA 3 1200 MG CAPS Take 1,200 mg by mouth daily.   Yes [provider]  ramipril (ALTACE) 10 MG tablet Take  10 mg by mouth 2 (two) times daily.   Yes [provider]   . insulin aspart  0-9 Units Subcutaneous Q4H  . mouth rinse  15 mL Mouth Rinse BID  . pantoprazole (PROTONIX) IV  40 mg Intravenous Q12H  . potassium chloride  20 mEq Oral Once   PRN Meds acetaminophen **OR** acetaminophen, hydrALAZINE, ondansetron (ZOFRAN) IV, promethazine Results for orders placed or performed during the hospital encounter of 08/28/2017 (from the past 48 hour(s))  Glucose, capillary     Status: Abnormal   Collection Time: 08/30/17 11:41 AM  Result Value Ref Range   Glucose-Capillary 161 (H) 65 - 99 mg/dL   Comment 1 Notify RN    Comment 2 Document in Chart   Hemoglobin and hematocrit, blood     Status: Abnormal   Collection Time: 08/30/17 12:38 PM  Result Value Ref Range   Hemoglobin 11.7 (L) 12.0 - 15.0 g/dL    Comment: DELTA CHECK NOTED POST TRANSFUSION SPECIMEN    HCT 34.3 (L) 36.0 - 46.0 %  Procalcitonin - Baseline     Status: None   Collection Time: 08/30/17  1:03 PM  Result Value Ref Range   Procalcitonin 27.63 ng/mL    Comment:        Interpretation: PCT >= 10 ng/mL: Important systemic inflammatory response, almost exclusively due to severe bacterial sepsis or septic shock. (NOTE)         ICU PCT Algorithm               Non ICU PCT Algorithm    ----------------------------     ------------------------------         PCT < 0.25 ng/mL                 PCT < 0.1 ng/mL     Stopping of antibiotics            Stopping of antibiotics       strongly encouraged.               strongly encouraged.    ----------------------------     ------------------------------       PCT level decrease by               PCT < 0.25 ng/mL       >= 80% from peak PCT       OR PCT 0.25 - 0.5 ng/mL          Stopping of antibiotics                                             encouraged.     Stopping of antibiotics           encouraged.    ----------------------------     ------------------------------  PCT  level decrease by              PCT >= 0.25 ng/mL       < 80% from peak PCT        AND PCT >= 0.5 ng/mL             Continuing antibiotics                                              encouraged.       Continuing antibiotics            encouraged.    ----------------------------     ------------------------------     PCT level increase compared          PCT > 0.5 ng/mL         with peak PCT AND          PCT >= 0.5 ng/mL             Escalation of antibiotics                                          strongly encouraged.      Escalation of antibiotics        strongly encouraged.   Lactic acid, plasma     Status: Abnormal   Collection Time: 08/30/17  1:03 PM  Result Value Ref Range   Lactic Acid, Venous 9.0 (HH) 0.5 - 1.9 mmol/L    Comment: CRITICAL RESULT CALLED TO, READ BACK BY AND VERIFIED WITH: MCNABB,B. RN AT 2536 08/30/17 MULLINS,T   Culture, Urine     Status: None   Collection Time: 08/30/17  3:48 PM  Result Value Ref Range   Specimen Description URINE, CATHETERIZED    Special Requests Normal    Culture      NO GROWTH Performed at [facility], [place] [address] [place], [place] [address]    Report Status 08/31/2017 FINAL   Glucose, capillary     Status: Abnormal   Collection Time: 08/30/17  4:05 PM  Result Value Ref Range   Glucose-Capillary 167 (H) 65 - 99 mg/dL  Hemoglobin and hematocrit, blood     Status: Abnormal   Collection Time: 08/30/17  4:07 PM  Result Value Ref Range   Hemoglobin 10.3 (L) 12.0 - 15.0 g/dL   HCT 29.6 (L) 36.0 - 46.0 %  Lactic acid, plasma     Status: Abnormal   Collection Time: 08/30/17  4:07 PM  Result Value Ref Range   Lactic Acid, Venous 3.6 (HH) 0.5 - 1.9 mmol/L    Comment: CRITICAL RESULT CALLED TO, READ BACK BY AND VERIFIED WITH: MCNABB,B @ 1702 ON [id number] BY POTEAT,S   Culture, blood (routine x 2)     Status: None (Preliminary result)   Collection Time: 08/30/17  4:07 PM  Result Value Ref Range   Specimen Description BLOOD  RIGHT ARM    Special Requests IN PEDIATRIC BOTTLE Blood Culture adequate volume    Culture      NO GROWTH 2 DAYS Performed at [facility], [place] [address] [place], [place] [address]    Report Status PENDING   Culture, blood (routine x 2)     Status: None (Preliminary result)  Collection Time: 08/30/17  4:19 PM  Result Value Ref Range   Specimen Description BLOOD RIGHT HAND    Special Requests IN PEDIATRIC BOTTLE Blood Culture adequate volume    Culture      NO GROWTH 2 DAYS Performed at [facility], [place] [address], [place], [place] [address]    Report Status PENDING   Glucose, capillary     Status: Abnormal   Collection Time: 08/30/17  7:27 PM  Result Value Ref Range   Glucose-Capillary 148 (H) 65 - 99 mg/dL  Hemoglobin and hematocrit, blood     Status: Abnormal   Collection Time: 08/30/17  8:06 PM  Result Value Ref Range   Hemoglobin 9.4 (L) 12.0 - 15.0 g/dL   HCT 26.7 (L) 36.0 - 46.0 %  Glucose, capillary     Status: Abnormal   Collection Time: 08/30/17  8:55 PM  Result Value Ref Range   Glucose-Capillary 143 (H) 65 - 99 mg/dL  Glucose, capillary     Status: Abnormal   Collection Time: 08/30/17 11:07 PM  Result Value Ref Range   Glucose-Capillary 121 (H) 65 - 99 mg/dL  Hemoglobin and hematocrit, blood     Status: Abnormal   Collection Time: 08/31/17 12:10 AM  Result Value Ref Range   Hemoglobin 9.1 (L) 12.0 - 15.0 g/dL   HCT 25.4 (L) 36.0 - 46.0 %  Hemoglobin and hematocrit, blood     Status: Abnormal   Collection Time: 08/31/17  3:31 AM  Result Value Ref Range   Hemoglobin 8.8 (L) 12.0 - 15.0 g/dL   HCT 24.6 (L) 36.0 - 46.0 %  Heparin level (unfractionated)     Status: Abnormal   Collection Time: 08/31/17  3:31 AM  Result Value Ref Range   Heparin Unfractionated <0.10 (L) 0.30 - 0.70 IU/mL    Comment:        IF HEPARIN RESULTS ARE BELOW EXPECTED VALUES, AND PATIENT DOSAGE HAS BEEN CONFIRMED, SUGGEST FOLLOW UP TESTING OF ANTITHROMBIN III LEVELS.    Renal function panel     Status: Abnormal   Collection Time: 08/31/17  3:31 AM  Result Value Ref Range   Sodium 143 135 - 145 mmol/L   Potassium 3.0 (L) 3.5 - 5.1 mmol/L   Chloride 96 (L) 101 - 111 mmol/L   CO2 32 22 - 32 mmol/L   Glucose, Bld 104 (H) 65 - 99 mg/dL   BUN 44 (H) 6 - 20 mg/dL   Creatinine, Ser 2.87 (H) 0.44 - 1.00 mg/dL   Calcium 6.3 (LL) 8.9 - 10.3 mg/dL    Comment: CRITICAL RESULT CALLED TO, READ BACK BY AND VERIFIED WITH: A SAWYER RN 08/31/17 0424 A NAVARRO    Phosphorus 4.5 2.5 - 4.6 mg/dL   Albumin 1.7 (L) 3.5 - 5.0 g/dL   GFR calc non Af Amer 15 (L) >60 mL/min   GFR calc Af Amer 17 (L) >60 mL/min    Comment: (NOTE) The eGFR has been calculated using the CKD EPI equation. This calculation has not been validated in all clinical situations. eGFR's persistently <60 mL/min signify possible Chronic Kidney Disease.    Anion gap 15 5 - 15  Magnesium     Status: Abnormal   Collection Time: 08/31/17  3:31 AM  Result Value Ref Range   Magnesium 1.0 (L) 1.7 - 2.4 mg/dL  CBC with Differential/Platelet     Status: Abnormal   Collection Time: 08/31/17  3:31 AM  Result Value Ref Range  WBC 44.2 (H) 4.0 - 10.5 K/uL   RBC 2.90 (L) 3.87 - 5.11 MIL/uL   Hemoglobin 8.8 (L) 12.0 - 15.0 g/dL   HCT 24.5 (L) 36.0 - 46.0 %   MCV 84.5 78.0 - 100.0 fL   MCH 30.3 26.0 - 34.0 pg   MCHC 35.9 30.0 - 36.0 g/dL   RDW 15.1 11.5 - 15.5 %   Platelets 380 150 - 400 K/uL   Neutrophils Relative % 89 %   Lymphocytes Relative 8 %   Monocytes Relative 3 %   Eosinophils Relative 0 %   Basophils Relative 0 %   Neutro Abs 39.4 (H) 1.7 - 7.7 K/uL   Lymphs Abs 3.5 0.7 - 4.0 K/uL   Monocytes Absolute 1.3 (H) 0.1 - 1.0 K/uL   Eosinophils Absolute 0.0 0.0 - 0.7 K/uL   Basophils Absolute 0.0 0.0 - 0.1 K/uL   WBC Morphology TOXIC GRANULATION   Procalcitonin     Status: None   Collection Time: 08/31/17  3:31 AM  Result Value Ref Range   Procalcitonin 29.71 ng/mL    Comment:         Interpretation: PCT >= 10 ng/mL: Important systemic inflammatory response, almost exclusively due to severe bacterial sepsis or septic shock. (NOTE)         ICU PCT Algorithm               Non ICU PCT Algorithm    ----------------------------     ------------------------------         PCT < 0.25 ng/mL                 PCT < 0.1 ng/mL     Stopping of antibiotics            Stopping of antibiotics       strongly encouraged.               strongly encouraged.    ----------------------------     ------------------------------       PCT level decrease by               PCT < 0.25 ng/mL       >= 80% from peak PCT       OR PCT 0.25 - 0.5 ng/mL          Stopping of antibiotics                                             encouraged.     Stopping of antibiotics           encouraged.    ----------------------------     ------------------------------       PCT level decrease by              PCT >= 0.25 ng/mL       < 80% from peak PCT        AND PCT >= 0.5 ng/mL             Continuing antibiotics                                              encouraged.       Continuing antibiotics  encouraged.    ----------------------------     ------------------------------     PCT level increase compared          PCT > 0.5 ng/mL         with peak PCT AND          PCT >= 0.5 ng/mL             Escalation of antibiotics                                          strongly encouraged.      Escalation of antibiotics        strongly encouraged.   Creatinine, serum     Status: Abnormal   Collection Time: 08/31/17  3:31 AM  Result Value Ref Range   Creatinine, Ser 2.84 (H) 0.44 - 1.00 mg/dL   GFR calc non Af Amer 15 (L) >60 mL/min   GFR calc Af Amer 18 (L) >60 mL/min    Comment: (NOTE) The eGFR has been calculated using the CKD EPI equation. This calculation has not been validated in all clinical situations. eGFR's persistently <60 mL/min signify possible Chronic Kidney Disease.   Glucose,  capillary     Status: Abnormal   Collection Time: 08/31/17  3:44 AM  Result Value Ref Range   Glucose-Capillary 103 (H) 65 - 99 mg/dL  Glucose, capillary     Status: Abnormal   Collection Time: 08/31/17  7:52 AM  Result Value Ref Range   Glucose-Capillary 116 (H) 65 - 99 mg/dL   Comment 1 Notify RN    Comment 2 Document in Chart   Hemoglobin and hematocrit, blood     Status: Abnormal   Collection Time: 08/31/17  9:03 AM  Result Value Ref Range   Hemoglobin 8.6 (L) 12.0 - 15.0 g/dL   HCT 24.4 (L) 36.0 - 46.0 %  Hemoglobin and hematocrit, blood     Status: Abnormal   Collection Time: 08/31/17 12:01 PM  Result Value Ref Range   Hemoglobin 9.0 (L) 12.0 - 15.0 g/dL   HCT 26.3 (L) 36.0 - 46.0 %  Glucose, capillary     Status: Abnormal   Collection Time: 08/31/17 12:55 PM  Result Value Ref Range   Glucose-Capillary 119 (H) 65 - 99 mg/dL   Comment 1 Notify RN    Comment 2 Document in Chart   Glucose, capillary     Status: Abnormal   Collection Time: 08/31/17  4:13 PM  Result Value Ref Range   Glucose-Capillary 132 (H) 65 - 99 mg/dL   Comment 1 Notify RN    Comment 2 Document in Chart   Hemoglobin and hematocrit, blood     Status: Abnormal   Collection Time: 08/31/17  4:24 PM  Result Value Ref Range   Hemoglobin 8.3 (L) 12.0 - 15.0 g/dL   HCT 24.0 (L) 36.0 - 46.0 %  Glucose, capillary     Status: Abnormal   Collection Time: 08/31/17  7:30 PM  Result Value Ref Range   Glucose-Capillary 148 (H) 65 - 99 mg/dL   Comment 1 Notify RN    Comment 2 Document in Chart   Hemoglobin and hematocrit, blood     Status: Abnormal   Collection Time: 08/31/17  8:05 PM  Result Value Ref Range   Hemoglobin 8.1 (L) 12.0 - 15.0 g/dL   HCT 23.3 (L) 36.0 -  46.0 %  Urinalysis, Routine w reflex microscopic     Status: Abnormal   Collection Time: 08/31/17  9:11 PM  Result Value Ref Range   Color, Urine YELLOW YELLOW   APPearance CLOUDY (A) CLEAR   Specific Gravity, Urine 1.017 1.005 - 1.030   pH 5.0  5.0 - 8.0   Glucose, UA NEGATIVE NEGATIVE mg/dL   Hgb urine dipstick MODERATE (A) NEGATIVE   Bilirubin Urine NEGATIVE NEGATIVE   Ketones, ur 5 (A) NEGATIVE mg/dL   Protein, ur 30 (A) NEGATIVE mg/dL   Nitrite NEGATIVE NEGATIVE   Leukocytes, UA SMALL (A) NEGATIVE   RBC / HPF 6-30 0 - 5 RBC/hpf   WBC, UA 6-30 0 - 5 WBC/hpf   Bacteria, UA RARE (A) NONE SEEN   Squamous Epithelial / LPF 0-5 (A) NONE SEEN   Mucus PRESENT    Hyaline Casts, UA PRESENT   Occult blood card to lab, stool     Status: Abnormal   Collection Time: 08/31/17  9:12 PM  Result Value Ref Range   Fecal Occult Bld POSITIVE (A) NEGATIVE  Hemoglobin and hematocrit, blood     Status: Abnormal   Collection Time: 08/31/17 10:37 PM  Result Value Ref Range   Hemoglobin 8.0 (L) 12.0 - 15.0 g/dL   HCT 23.6 (L) 36.0 - 46.0 %  Glucose, capillary     Status: Abnormal   Collection Time: 08/31/17 11:13 PM  Result Value Ref Range   Glucose-Capillary 125 (H) 65 - 99 mg/dL   Comment 1 Notify RN    Comment 2 Document in Chart   Glucose, capillary     Status: Abnormal   Collection Time: 09/01/17  3:13 AM  Result Value Ref Range   Glucose-Capillary 125 (H) 65 - 99 mg/dL   Comment 1 Notify RN    Comment 2 Document in Chart   Renal function panel     Status: Abnormal   Collection Time: 09/01/17  3:36 AM  Result Value Ref Range   Sodium 139 135 - 145 mmol/L   Potassium 3.7 3.5 - 5.1 mmol/L    Comment: DELTA CHECK NOTED   Chloride 96 (L) 101 - 111 mmol/L   CO2 28 22 - 32 mmol/L   Glucose, Bld 140 (H) 65 - 99 mg/dL   BUN 39 (H) 6 - 20 mg/dL   Creatinine, Ser 2.12 (H) 0.44 - 1.00 mg/dL   Calcium 7.2 (L) 8.9 - 10.3 mg/dL   Phosphorus 3.1 2.5 - 4.6 mg/dL   Albumin 1.7 (L) 3.5 - 5.0 g/dL   GFR calc non Af Amer 22 (L) >60 mL/min   GFR calc Af Amer 25 (L) >60 mL/min    Comment: (NOTE) The eGFR has been calculated using the CKD EPI equation. This calculation has not been validated in all clinical situations. eGFR's persistently <60  mL/min signify possible Chronic Kidney Disease.    Anion gap 15 5 - 15  Magnesium     Status: None   Collection Time: 09/01/17  3:36 AM  Result Value Ref Range   Magnesium 1.8 1.7 - 2.4 mg/dL  CBC with Differential/Platelet     Status: Abnormal   Collection Time: 09/01/17  3:36 AM  Result Value Ref Range   WBC 35.9 (H) 4.0 - 10.5 K/uL    Comment: WHITE COUNT CONFIRMED ON SMEAR   RBC 2.87 (L) 3.87 - 5.11 MIL/uL   Hemoglobin 8.7 (L) 12.0 - 15.0 g/dL   HCT 24.9 (L) 36.0 - 46.0 %  MCV 86.8 78.0 - 100.0 fL   MCH 30.3 26.0 - 34.0 pg   MCHC 34.9 30.0 - 36.0 g/dL   RDW 15.4 11.5 - 15.5 %   Platelets 302 150 - 400 K/uL   Neutrophils Relative % 92 %   Lymphocytes Relative 5 %   Monocytes Relative 3 %   Eosinophils Relative 0 %   Basophils Relative 0 %   Neutro Abs 33.0 (H) 1.7 - 7.7 K/uL   Lymphs Abs 1.8 0.7 - 4.0 K/uL   Monocytes Absolute 1.1 (H) 0.1 - 1.0 K/uL   Eosinophils Absolute 0.0 0.0 - 0.7 K/uL   Basophils Absolute 0.0 0.0 - 0.1 K/uL   RBC Morphology POLYCHROMASIA PRESENT   Procalcitonin     Status: None   Collection Time: 09/01/17  3:36 AM  Result Value Ref Range   Procalcitonin 12.88 ng/mL    Comment:        Interpretation: PCT >= 10 ng/mL: Important systemic inflammatory response, almost exclusively due to severe bacterial sepsis or septic shock. (NOTE)         ICU PCT Algorithm               Non ICU PCT Algorithm    ----------------------------     ------------------------------         PCT < 0.25 ng/mL                 PCT < 0.1 ng/mL     Stopping of antibiotics            Stopping of antibiotics       strongly encouraged.               strongly encouraged.    ----------------------------     ------------------------------       PCT level decrease by               PCT < 0.25 ng/mL       >= 80% from peak PCT       OR PCT 0.25 - 0.5 ng/mL          Stopping of antibiotics                                             encouraged.     Stopping of antibiotics            encouraged.    ----------------------------     ------------------------------       PCT level decrease by              PCT >= 0.25 ng/mL       < 80% from peak PCT        AND PCT >= 0.5 ng/mL             Continuing antibiotics                                              encouraged.       Continuing antibiotics            encouraged.    ----------------------------     ------------------------------     PCT level increase compared          PCT > 0.5 ng/mL  with peak PCT AND          PCT >= 0.5 ng/mL             Escalation of antibiotics                                          strongly encouraged.      Escalation of antibiotics        strongly encouraged.   Creatinine, serum     Status: Abnormal   Collection Time: 09/01/17  3:36 AM  Result Value Ref Range   Creatinine, Ser 2.10 (H) 0.44 - 1.00 mg/dL   GFR calc non Af Amer 22 (L) >60 mL/min   GFR calc Af Amer 25 (L) >60 mL/min    Comment: (NOTE) The eGFR has been calculated using the CKD EPI equation. This calculation has not been validated in all clinical situations. eGFR's persistently <60 mL/min signify possible Chronic Kidney Disease.   Glucose, capillary     Status: Abnormal   Collection Time: 09/01/17  7:23 AM  Result Value Ref Range   Glucose-Capillary 162 (H) 65 - 99 mg/dL   Comment 1 Notify RN    Comment 2 Document in Chart   Hemoglobin and hematocrit, blood     Status: Abnormal   Collection Time: 09/01/17  8:24 AM  Result Value Ref Range   Hemoglobin 8.3 (L) 12.0 - 15.0 g/dL   HCT 24.1 (L) 36.0 - 46.0 %    Dg Abd 1 View  Result Date: 08/31/2017 CLINICAL DATA:  Nasogastric tube placement.  Initial encounter. EXAM: ABDOMEN - 1 VIEW COMPARISON:  Abdominal radiograph performed 08/30/2017 FINDINGS: The patient's enteric tube is seen ending overlying the pylorus or first segment of the duodenum. The stomach is partially filled with air. No free intra-abdominal air is seen, though evaluation for free air is  limited on a single supine view. No acute osseous abnormalities are identified. Flowing osteophytes are seen along the lower thoracic and upper lumbar spine. IMPRESSION: Enteric tube noted ending overlying the pylorus or first segment of the duodenum. Electronically Signed   By: Garald Balding M.D.   On: 08/31/2017 01:54   US Renal  Result Date: 08/30/2017 CLINICAL DATA:  Oliguria for today. EXAM: RENAL / URINARY TRACT ULTRASOUND COMPLETE COMPARISON:  None. FINDINGS: Right Kidney: Length: 9.4 cm. Echogenicity within normal limits. There is cortical thinning. No mass or hydronephrosis visualized. Left Kidney: Length: 10.4 cm. Echogenicity within normal limits. There is cortical thinning. No mass or hydronephrosis visualized. Bladder: The bladder is decompressed with question Foley catheter in place. Trace ascites and right pleural effusion are noted. There are gallstones and sludge in the gallbladder. There is a complex masslike lesion in the midline to left pelvis. IMPRESSION: Bilateral renal cortical thinning. No acute abnormalities identified in the kidney. Gallstones and sludge within the gallbladder. There is a question complex masslike lesion in the midline to left pelvis. Trace ascites and right pleural effusion noted. Electronically Signed   By: Abelardo Diesel M.D.   On: 08/30/2017 21:41   Dg Chest Port 1 View  Result Date: 08/30/2017 CLINICAL DATA:  Cough, recent small bowel obstruction, diabetes EXAM: PORTABLE CHEST 1 VIEW COMPARISON:  CT chest of 08/23/2017 and chest x-ray of 09/04/2017 FINDINGS: There is peripheral opacity in the right mid lung suspicious for a patchy area of pneumonia. The left lung is clear. A  tiny right pleural effusion cannot be excluded. Mediastinal and hilar contours are unremarkable and the heart remains within upper limits of normal. NG tube extends below the hemidiaphragm. IMPRESSION: 1. Peripheral opacity in the right mid lung may represent a small focus of pneumonia.  2. Cannot exclude a tiny right pleural effusion. Electronically Signed   By: Ivar Drape M.D.   On: 08/30/2017 15:54   Dg Abd Portable 1v  Result Date: 08/31/2017 CLINICAL DATA:  Gastric distention . EXAM: PORTABLE ABDOMEN - 1 VIEW COMPARISON:  08/31/2017 . FINDINGS: Interim removal of NG tube. Mild gastric distention noted. No evidence of small bowel distention. No free air. Vascular calcification. Degenerative changes lumbar spine and both hips. IMPRESSION: Interim removal of NG tube.  Mild gastric distention noted. Electronically Signed   By: Marcello Moores  Register   On: 08/31/2017 06:46   ROS: essentially unobtainable due to her confusion            Blood pressure (!) 162/76, pulse 96, temperature 99.9 F (37.7 C), temperature source Core (Comment), resp. rate 17, height 5' 2"  (1.575 m), weight 64.4 kg (141 lb 15.6 oz), SpO2 96 %.  Physical exam:   General-- patient restrained in bed with mittens on her hands and quite confused but in no distress ENT-- nonicteric Neck-- appears to have full range of motion Heart-- regular rate and rhythm without murmurs gallops Lungs-- clear Abdomen-- soft and nontender Psych-- as above  Assessment: 1. G.I. Bleed. This is likely due to an ulcer. She has had a somewhat dilated stomach. We've been unable to contact her husband but I have discussed with her son. I think she will need an EGD when she is more alert. She is on Protonix and is not required transfusion in 2 days and her hemoglobin has been relatively stable. 2. Uterine cancer. Is being followed by GYN oncology 3. Questionable sepsis. There is a pneumonia seen on CT of the chest but no PE. On antibiotics 4. CKD. She has had ARF after contrast with a CT scan and has been followed by nephrology 5. Marked confusion. This is probably multifactorial 6. Aspiration pneumonia 7. DVT 8. Diabetes type II with CKD  Plan: patient appears to be stable currently and I suspect that she has an ulcer.  I have discussed this with her son. At this point, we will obtain consent from her husband when we are able to contact him and will make her NPO. If she is improved in the morning will go ahead with bedside EGD or she becomes unstable during the night we will perform EGD. I would prefer to wait until her acute confusion has improved.   Anari Evitt JR,Jovon Winterhalter L 09/01/2017, 11:08 AM   This note was created using voice recognition software and minor errors may Have occurred unintentionally. Pager: [phone] If no answer or after hours call [phone]

## 2017-09-01 NOTE — Progress Notes (Signed)
Patient ID: Kimberly Castaneda, female   DOB: [birth year], 76 y.o.   MRN: [id number]    PROGRESS NOTE  Riley Papin  WUJ:[id number] DOB: 09-19-1941 DOA: 08/20/2017  PCP: [name], MD   Brief Narrative:  Patient is 75 year old female with known and chronic diastolic CHF, nonischemic cardiomyopathy, hypertension, chronic leukocytosis, presented after an episode of near syncope. Patient was brought into the hospital day after syncopal event and was found to have hypercalcemia, leukocytosis, DVT in the left lower extremity. Patient was seen by oncology, underwent CT scan of the chest abdomen and pelvis which raises concern for uterine mass. Patient subsequently developed acute renal failure due to contrast, nephrology was consulted but has currently signed off his creatinine function has improved. Patient also underwent biopsy of the uterine/cervical mass on Monday, 08/27/2017.  Major events since admission:  Overnight 10/24 to 10/25, pt with multiple episodes of vomiting, more confusion and hypotension, SBP in 70's, required transfer to SDU 08/30/2017 am, blood work also notable for drop in Hg ~6 and 2 U PRBC ordered for transfusion. ABd XRAY worrisome for GOO. Surgery and PCCM consulted for assistance.   10/27 - still somewhat lethargic, blood bowel movement, consulted GI    Assessment & Plan: Nausea and vomiting 10/24 - 10/25, blood in stool 10/27 - in pt with recent finding of uterine/vervical mass concerning for malignancy - initial concern for GOO - surgery consulted, NG tube was placed on October 25 - Patient has removed NG tube 10/26, per surgery, patient without any evidence of mechanical gastric outlet obstruction, OK to keep NGT out - due to blood in stool, GI consulted for assistance   Hypotension 10/25, septic shock 08/30/2017, ? RML PNA - elevated lactic acid, pro calcitonin - Further drop in blood pressure exacerbated by acute blood loss anemia - Blood cultures have been  obtained, currently no growth to date - patient had antibiotics broadened to vancomycin and Fortaz, we'll continue same regimen day #3 and narrow down as clinically indicated - Patient has received 2 units of PRBC October 25 with appropriate increase in posttransfusion hemoglobin - Please note that anticoagulation has been held - due to bloody stool, Gi consulted, may need RGD  Acute on chronic kidney disease stage II/non-anion gap metabolic acidosis - suspected to be contrast-induced - Overall creatinine was trending down but overnight due to vomiting and hypotension, Cr up again 10/25 - initial Renal ultrasound did not show any signs of hydronephrosis, repeat ultrasound 08/30/2017, no evidence of obstruction - Nephrology team signed off 10/23 - Cr is slightly better this AM   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 Denman George - Recommendation is to obtain PET/CT scan but this will be scheduled as an outpatient - Dr. Maryland Pink discussed with Dr. Lebron Conners, recommend follow-up with Dr. Alvy Bimler in an outpatient setting - I have discussed pathology report with Dr. Saralyn Pilar, preliminary report high suspicion for cervical, endometrial adenocarcinoma, poorly differentiated - I have discussed these findings with husband - I have spoke with GYN oncology oncall, treatment will likely be palliative in nature, Dr. Denman George to see on Monday for further discussions   Acute metabolic encephalopathy - due to septic shock, above-mentioned illnesses - still confused   Near syncope - Etiology not entirely clear, suspected dehydration - No recurrence since admission  Acute Left lower extremity DVT. - initially placed on Lovenox but  due to worsening renal function, changed to heparin - anticoagulation discontinued due to acute blood loss anemia as noted above  and ongoing likely GI bleed   Aspiration pneumonia - has been on Augmentin initially however, 10/25 developed septic shock, suspicion of new right middle lobe pneumonia - Antibiotics have been broadened to vancomycin and Fortaz as noted above, continued day #3  Leukocytosis. - still elevated, etiology not entirely clear - Has been treated appropriately with antibiotics and despite this - could be related to underlying malignancy - WBC is better this AM   Hypocalcemia, hypokalemia, hypomagnesemia - Mg, Phospp, K stable this AM   History of nonischemic cardiomyopathy/chronic diastolic dysfunction. - echo results outlined below  Diabetes mellitus type 2. - continue sliding scale insulin  History of essential hypertension - antihypertensive regimen on hold  Normocytic anemia. Acute blood loss anemia imposed on chronic anemia  - has been transfused 1 unit of blood on 08/24/2017 - 2 units PRBC transfused 08/30/2017 - GI consulted as noted above, plan for EGD once pt more medically stable   DVT prophylaxis: IV heparin stopped, changed to SCD Code Status: full code Family Communication: updated husband over the phone Disposition Plan: keep in step down unit  Consultants:   Medical oncology  GYN oncology  Nephrology - signed off 10/23  PCCM  Surgery   Pathologist Dr. Saralyn Pilar over the phone  GI  Procedures:   Left lower extremity venous Doppler, positive for acute DVT in the posterior tibial vein  Transthoracic echo EF 55-60% with grade 1 diastolic CHF  Antimicrobials:   Unasyn initiated on 08/23/2017, transition to oral Augmentin 08/28/2017 --> 10/24  Unasyn 10/24 --> 10/25  Vancomycin 10/25 -->  Fortaz 10/25 -->  Subjective: Pt still confused.   Objective: Vitals:   09/01/17 0300 09/01/17 0343 09/01/17 0400 09/01/17 0600  BP:   (!) 178/86 (!) 176/89  Pulse:   (!) 104 (!) 108  Resp:   19 17  Temp:   99.7 F (37.6 C) 99.9 F (37.7 C)  TempSrc:       SpO2: 94%  95% 97%  Weight:  64.4 kg (141 lb 15.6 oz)    Height:        Intake/Output Summary (Last 24 hours) at 09/01/17 0817 Last data filed at 09/01/17 0400  Gross per 24 hour  Intake           753.33 ml  Output             1150 ml  Net          -396.67 ml   Filed Weights   08/30/17 0800 08/31/17 0500 09/01/17 0343  Weight: 63.7 kg (140 lb 6.9 oz) 78.6 kg (173 lb 4.5 oz) 64.4 kg (141 lb 15.6 oz)   Physical Exam  Constitutional: Appears somnolent, NAD CVS: Tachycardic, no murmurs  Pulmonary: rales at bases  Abdominal: Soft. BS +,  no distension, tenderness, Musculoskeletal: No edema and no tenderness.   Data Reviewed: I have personally reviewed following labs and imaging studies  CBC:  Recent Labs Lab 08/28/17 0401 08/29/17 0502 08/30/17 0425  08/31/17 0331  08/31/17 1201 08/31/17 1624 08/31/17 2005 08/31/17 2237 09/01/17 0336  WBC 22.9* 25.9* 35.8*  --  44.2*  --   --   --   --   --  35.9*  NEUTROABS  --   --   --   --  39.4*  --   --   --   --   --  33.0*  HGB 7.8* 8.1* 6.3*  < > 8.8*  8.8*  < > 9.0* 8.3* 8.1* 8.0* 8.7*  HCT 23.5* 23.9* 18.7*  < > 24.5*  24.6*  < > 26.3* 24.0* 23.3* 23.6* 24.9*  MCV 86.4 86.6 87.0  --  84.5  --   --   --   --   --  86.8  PLT 472* 488* 566*  --  380  --   --   --   --   --  302  < > = values in this interval not displayed. Basic Metabolic Panel:  Recent Labs Lab 08/28/17 0401 08/29/17 0502 08/30/17 0425 08/31/17 0331 09/01/17 0336  NA 134*  134* 135 139 143 139  K 3.5  3.5 3.3* 3.5 3.0* 3.7  CL 107  107 101 94* 96* 96*  CO2 17*  17* 23 24 32 28  GLUCOSE 126*  127* 131* 167* 104* 140*  BUN 37*  37* 29* 36* 44* 39*  CREATININE 2.56*  2.57* 1.83* 2.16* 2.87*  2.84* 2.12*  2.10*  CALCIUM 7.4*  7.4* 6.8* 7.0* 6.3* 7.2*  MG  --   --   --  1.0* 1.8  PHOS 2.6  --   --  4.5 3.1   Liver Function Tests:  Recent Labs Lab 08/28/17 0401 08/31/17 0331 09/01/17 0336  ALBUMIN 1.6* 1.7* 1.7*    CBG:  Recent Labs Lab 08/31/17 1613 08/31/17 1930 08/31/17 2313 09/01/17 0313 09/01/17 0723  GLUCAP 132* 148* 125* 125* 162*   Urine analysis:    Component Value Date/Time   COLORURINE YELLOW 08/31/2017 2111   APPEARANCEUR CLOUDY (A) 08/31/2017 2111   LABSPEC 1.017 08/31/2017 2111   PHURINE 5.0 08/31/2017 2111   GLUCOSEU NEGATIVE 08/31/2017 2111   HGBUR MODERATE (A) 08/31/2017 2111   BILIRUBINUR NEGATIVE 08/31/2017 2111   KETONESUR 5 (A) 08/31/2017 2111   PROTEINUR 30 (A) 08/31/2017 2111   UROBILINOGEN 1.0 11/21/2010 2342   NITRITE NEGATIVE 08/31/2017 2111   LEUKOCYTESUR SMALL (A) 08/31/2017 2111   Recent Results (from the past 240 hour(s))  Culture, Urine     Status: None   Collection Time: 08/26/17  6:44 PM  Result Value Ref Range Status   Specimen Description URINE, RANDOM  Final   Special Requests NONE  Final   Culture   Final    NO GROWTH Performed at [facility], [address], [place], [place] [address]    Report Status 08/28/2017 FINAL  Final  MRSA PCR Screening     Status: None   Collection Time: 08/30/17  8:07 AM  Result Value Ref Range Status   MRSA by PCR NEGATIVE NEGATIVE Final    Comment:        The GeneXpert MRSA Assay (FDA approved for NASAL specimens only), is one component of a comprehensive MRSA colonization surveillance program. It is not intended to diagnose MRSA infection nor to guide or monitor treatment for MRSA infections.   Culture, Urine     Status: None   Collection Time: 08/30/17  3:48 PM  Result Value Ref Range Status   Specimen Description URINE, CATHETERIZED  Final   Special Requests Normal  Final   Culture   Final    NO GROWTH Performed at [facility], [address], [place], [place] [address]    Report Status 08/31/2017 FINAL  Final  Culture, blood (routine x 2)     Status: None (Preliminary result)   Collection Time: 08/30/17  4:07 PM  Result Value Ref Range Status   Specimen Description BLOOD  RIGHT ARM  Final   Special Requests IN PEDIATRIC BOTTLE Blood Culture adequate volume  Final   Culture   Final    NO GROWTH 2 DAYS Performed at [facility], [address], [place], [place] [address]    Report Status PENDING  Incomplete  Culture, blood (routine x 2)     Status: None (Preliminary result)   Collection Time: 08/30/17  4:19 PM  Result Value Ref Range Status   Specimen Description BLOOD RIGHT HAND  Final   Special Requests IN PEDIATRIC BOTTLE Blood Culture adequate volume  Final   Culture   Final    NO GROWTH 2 DAYS Performed at [facility], [place] [address] [place], [place] [address]    Report Status PENDING  Incomplete    Radiology Studies: Dg Abd 1 View  Result Date: 08/31/2017 CLINICAL DATA:  Nasogastric tube placement.  Initial encounter. EXAM: ABDOMEN - 1 VIEW COMPARISON:  Abdominal radiograph performed 08/30/2017 FINDINGS: The patient's enteric tube is seen ending overlying the pylorus or first segment of the duodenum. The stomach is partially filled with air. No free intra-abdominal air is seen, though evaluation for free air is limited on a single supine view. No acute osseous abnormalities are identified. Flowing osteophytes are seen along the lower thoracic and upper lumbar spine. IMPRESSION: Enteric tube noted ending overlying the pylorus or first segment of the duodenum. Electronically Signed   By: Garald Balding M.D.   On: 08/31/2017 01:54   US Renal  Result Date: 08/30/2017 CLINICAL DATA:  Oliguria for today. EXAM: RENAL / URINARY TRACT ULTRASOUND COMPLETE COMPARISON:  None. FINDINGS: Right Kidney: Length: 9.4 cm. Echogenicity within normal limits. There is cortical thinning. No mass or hydronephrosis visualized. Left Kidney: Length: 10.4 cm. Echogenicity within normal limits. There is cortical thinning. No mass or hydronephrosis visualized. Bladder: The bladder is decompressed with question Foley catheter in place. Trace ascites and right pleural  effusion are noted. There are gallstones and sludge in the gallbladder. There is a complex masslike lesion in the midline to left pelvis. IMPRESSION: Bilateral renal cortical thinning. No acute abnormalities identified in the kidney. Gallstones and sludge within the gallbladder. There is a question complex masslike lesion in the midline to left pelvis. Trace ascites and right pleural effusion noted. Electronically Signed   By: Abelardo Diesel M.D.   On: 08/30/2017 21:41   Dg Chest Port 1 View  Result Date: 08/30/2017 CLINICAL DATA:  Cough, recent small bowel obstruction, diabetes EXAM: PORTABLE CHEST 1 VIEW COMPARISON:  CT chest of 08/23/2017 and chest x-ray of 08/24/2017 FINDINGS: There is peripheral opacity in the right mid lung suspicious for a patchy area of pneumonia. The left lung is clear. A tiny right pleural effusion cannot be excluded. Mediastinal and hilar contours are unremarkable and the heart remains within upper limits of normal. NG tube extends below the hemidiaphragm. IMPRESSION: 1. Peripheral opacity in the right mid lung may represent a small focus of pneumonia. 2. Cannot exclude a tiny right pleural effusion. Electronically Signed   By: Ivar Drape M.D.   On: 08/30/2017 15:54   Dg Abd Portable 1v  Result Date: 08/31/2017 CLINICAL DATA:  Gastric distention . EXAM: PORTABLE ABDOMEN - 1 VIEW COMPARISON:  08/31/2017 . FINDINGS: Interim removal of NG tube. Mild gastric distention noted. No evidence of small bowel distention. No free air. Vascular calcification. Degenerative changes lumbar spine and  both hips. IMPRESSION: Interim removal of NG tube.  Mild gastric distention noted. Electronically Signed   By: Marcello Moores  Register   On: 08/31/2017 06:46    Scheduled Meds: . insulin aspart  0-9 Units Subcutaneous Q4H  . mouth rinse  15 mL Mouth Rinse BID  . pantoprazole (PROTONIX) IV  40 mg Intravenous Q12H  . potassium chloride  20 mEq Oral Once   Continuous Infusions: . cefTAZidime  (FORTAZ)  IV Stopped (08/31/17 1750)  . dextrose 5 % and 0.45% NaCl 50 mL/hr at 08/31/17 2000  . phenylephrine (NEO-SYNEPHRINE) Adult infusion Stopped (08/30/17 0950)     LOS: 10 days   Time spent: 35 minutes   Faye Ramsay, MD [facility] Pager [phone]  If 7PM-7AM, please contact night-coverage [email or web address] Password [email or web address] 09/01/2017, 8:17 AM

## 2017-09-01 NOTE — Progress Notes (Signed)
Patient had a large burgundy  loose bowel movement -fecal occult blood positive, H & H checked, Hgb 8. [facility] floor-coverage notified. No new order received. BP 155/88, HR 111, RR 25, O2 Sat 97% on room air.

## 2017-09-02 ENCOUNTER — Encounter (HOSPITAL_COMMUNITY): Admission: EM | Disposition: E | Payer: Self-pay | Source: Home / Self Care | Attending: Internal Medicine

## 2017-09-02 ENCOUNTER — Encounter (HOSPITAL_COMMUNITY): Payer: Self-pay | Admitting: Gastroenterology

## 2017-09-02 HISTORY — PX: ESOPHAGOGASTRODUODENOSCOPY: SHX5428

## 2017-09-02 LAB — RENAL FUNCTION PANEL
ANION GAP: 14 (ref 5–15)
Albumin: 1.7 g/dL — ABNORMAL LOW (ref 3.5–5.0)
BUN: 29 mg/dL — ABNORMAL HIGH (ref 6–20)
CALCIUM: 7.2 mg/dL — AB (ref 8.9–10.3)
CHLORIDE: 96 mmol/L — AB (ref 101–111)
CO2: 29 mmol/L (ref 22–32)
CREATININE: 1.62 mg/dL — AB (ref 0.44–1.00)
GFR, EST AFRICAN AMERICAN: 35 mL/min — AB (ref >60)
GFR, EST NON AFRICAN AMERICAN: 30 mL/min — AB (ref >60)
Glucose, Bld: 113 mg/dL — ABNORMAL HIGH (ref 65–99)
Phosphorus: 2.9 mg/dL (ref 2.5–4.6)
Potassium: 3.1 mmol/L — ABNORMAL LOW (ref 3.5–5.1)
Sodium: 139 mmol/L (ref 135–145)

## 2017-09-02 LAB — HEMOGLOBIN AND HEMATOCRIT, BLOOD
HCT: 24 % — ABNORMAL LOW (ref 36.0–46.0)
HEMATOCRIT: 23.3 % — AB (ref 36.0–46.0)
HEMATOCRIT: 23.9 % — AB (ref 36.0–46.0)
HEMATOCRIT: 25.7 % — AB (ref 36.0–46.0)
Hemoglobin: 8.8 g/dL — ABNORMAL LOW (ref 12.0–15.0)
Hemoglobin: 8.1 g/dL — ABNORMAL LOW (ref 12.0–15.0)
Hemoglobin: 8.3 g/dL — ABNORMAL LOW (ref 12.0–15.0)
Hemoglobin: 8.1 g/dL — ABNORMAL LOW (ref 12.0–15.0)

## 2017-09-02 LAB — CBC WITH DIFFERENTIAL/PLATELET
BASOS ABS: 0 10*3/uL (ref 0.0–0.1)
Basophils Relative: 0 %
EOS ABS: 0.1 10*3/uL (ref 0.0–0.7)
EOS PCT: 0 %
HCT: 26.3 % — ABNORMAL LOW (ref 36.0–46.0)
HEMOGLOBIN: 8.9 g/dL — AB (ref 12.0–15.0)
LYMPHS ABS: 0.9 10*3/uL (ref 0.7–4.0)
LYMPHS PCT: 5 %
MCH: 30 pg (ref 26.0–34.0)
MCHC: 33.8 g/dL (ref 30.0–36.0)
MCV: 88.6 fL (ref 78.0–100.0)
Monocytes Absolute: 0.6 10*3/uL (ref 0.1–1.0)
Monocytes Relative: 3 %
NEUTROS PCT: 92 %
Neutro Abs: 17 10*3/uL — ABNORMAL HIGH (ref 1.7–7.7)
PLATELETS: 251 10*3/uL (ref 150–400)
RBC: 2.97 MIL/uL — AB (ref 3.87–5.11)
RDW: 15.8 % — AB (ref 11.5–15.5)
WBC: 18.6 10*3/uL — AB (ref 4.0–10.5)

## 2017-09-02 LAB — GLUCOSE, CAPILLARY
GLUCOSE-CAPILLARY: 108 mg/dL — AB (ref 65–99)
GLUCOSE-CAPILLARY: 102 mg/dL — AB (ref 65–99)
GLUCOSE-CAPILLARY: 77 mg/dL (ref 65–99)
GLUCOSE-CAPILLARY: 92 mg/dL (ref 65–99)
Glucose-Capillary: 121 mg/dL — ABNORMAL HIGH (ref 65–99)
Glucose-Capillary: 86 mg/dL (ref 65–99)

## 2017-09-02 LAB — MAGNESIUM: MAGNESIUM: 1.3 mg/dL — AB (ref 1.7–2.4)

## 2017-09-02 LAB — CREATININE, SERUM
Creatinine, Ser: 1.54 mg/dL — ABNORMAL HIGH (ref 0.44–1.00)
GFR calc non Af Amer: 32 mL/min — ABNORMAL LOW (ref >60)
GFR, EST AFRICAN AMERICAN: 37 mL/min — AB (ref >60)

## 2017-09-02 LAB — VANCOMYCIN, RANDOM: VANCOMYCIN RM: 15

## 2017-09-02 LAB — LACTIC ACID, PLASMA: LACTIC ACID, VENOUS: 1.1 mmol/L (ref 0.5–1.9)

## 2017-09-02 SURGERY — EGD (ESOPHAGOGASTRODUODENOSCOPY)
Anesthesia: Moderate Sedation

## 2017-09-02 MED ORDER — MAGNESIUM SULFATE 2 GM/50ML IV SOLN
2.0000 g | Freq: Once | INTRAVENOUS | Status: AC
  Administered 2017-09-02: 2 g via INTRAVENOUS
  Filled 2017-09-02: qty 50

## 2017-09-02 MED ORDER — POTASSIUM CHLORIDE 20 MEQ/15ML (10%) PO SOLN
40.0000 meq | Freq: Once | ORAL | Status: DC
  Filled 2017-09-02 (×2): qty 30

## 2017-09-02 MED ORDER — BUTAMBEN-TETRACAINE-BENZOCAINE 2-2-14 % EX AERO
INHALATION_SPRAY | CUTANEOUS | Status: DC | PRN
  Administered 2017-09-02: 1 via TOPICAL

## 2017-09-02 MED ORDER — FENTANYL CITRATE (PF) 100 MCG/2ML IJ SOLN
INTRAMUSCULAR | Status: DC | PRN
  Administered 2017-09-02: 25 ug via INTRAVENOUS

## 2017-09-02 MED ORDER — MIDAZOLAM HCL 5 MG/ML IJ SOLN
INTRAMUSCULAR | Status: AC
  Filled 2017-09-02: qty 3

## 2017-09-02 MED ORDER — LORAZEPAM 2 MG/ML IJ SOLN: 0.5000 mg | INTRAMUSCULAR | Status: DC | PRN

## 2017-09-02 MED ORDER — VANCOMYCIN HCL IN DEXTROSE 750-5 MG/150ML-% IV SOLN
750.0000 mg | Freq: Once | INTRAVENOUS | Status: AC
  Administered 2017-09-02: 750 mg via INTRAVENOUS
  Filled 2017-09-02: qty 150

## 2017-09-02 MED ORDER — LORAZEPAM 2 MG/ML IJ SOLN
0.5000 mg | INTRAMUSCULAR | Status: DC | PRN
  Administered 2017-09-02 – 2017-09-03 (×4): 1 mg via INTRAVENOUS
  Filled 2017-09-02 (×4): qty 1

## 2017-09-02 MED ORDER — POTASSIUM CHLORIDE 10 MEQ/100ML IV SOLN
10.0000 meq | INTRAVENOUS | Status: AC
  Administered 2017-09-02 (×2): 10 meq via INTRAVENOUS
  Filled 2017-09-02 (×2): qty 100

## 2017-09-02 MED ORDER — FENTANYL CITRATE (PF) 100 MCG/2ML IJ SOLN
INTRAMUSCULAR | Status: AC
  Filled 2017-09-02: qty 4

## 2017-09-02 MED ORDER — MIDAZOLAM HCL 10 MG/2ML IJ SOLN
INTRAMUSCULAR | Status: DC | PRN
  Administered 2017-09-02: 1 mg via INTRAVENOUS
  Administered 2017-09-02: 2 mg via INTRAVENOUS
  Administered 2017-09-02: 1 mg via INTRAVENOUS

## 2017-09-02 NOTE — Interval H&P Note (Signed)
History and Physical Interval Note:  09/04/2017 11:53 AM  Kimberly Castaneda  has presented today for surgery, with the diagnosis of GI bleed  The various methods of treatment have been discussed with the patient and family. After consideration of risks, benefits and other options for treatment, the patient has consented to  Procedure(s): ESOPHAGOGASTRODUODENOSCOPY (EGD) (N/A) as a surgical intervention .  The patient's history has been reviewed, patient examined, no change in status, stable for surgery.  I have reviewed the patient's chart and labs.  Questions were answered to the patient's satisfaction.     Kimberly Castaneda,Inez Rosato L

## 2017-09-02 NOTE — Progress Notes (Signed)
Pharmacy Antibiotic Note  Kimberly Castaneda is a 76 y.o. female admitted on 08/12/2017 with syncope, new DVT.  She is known to pharmacy from antibiotic & anticoagulant dosing.   She is currently on Day#9 antibiotics for aspiration PNA.  She is deteriorating clinically and antibiotics are being broadened to coverage hospital acquired pathogens.   Pharmacy has been consulted for Vancomycin & Fortaz dosing.  08/09/2017: Day #3 current antibiotics, Day #12 overall antibiotic therapy  WBC improving  Procalcitonin trending down  Tmax 100.6 (low grade fevers started early this am)  Renal: Scr improving (UOP acceptable at 0.5-0.7 ml/kg/h)  Random vancomycin level today at 03:42 = 15 mcg/mL    Plan:  Fortaz 2gm IV q24h - remains dose appropriately  Vancomycin 750mg  IV x 1  If SCr continues to improve (and vancomycin to continue), suspect can start scheduled vancomycin dosing (likely q36h or q48h dosing)  Monitor renal function and cx data   Daily Scr  Narrow antibiotics as clinically indicated, now having low-grade fevers  Height: 5\' 2"  (157.5 cm) Weight: 144 lb 10 oz (65.6 kg) IBW/kg (Calculated) : 50.1  Temp (24hrs), Avg:99.9 F (37.7 C), Min:99.3 F (37.4 C), Max:100.6 F (38.1 C)   Recent Labs Lab 08/29/17 0502 08/30/17 0425 08/30/17 1303 08/30/17 1607 08/31/17 0331 09/01/17 0336 08/11/2017 0342  WBC 25.9* 35.8*  --   --  44.2* 35.9* 18.6*  CREATININE 1.83* 2.16*  --   --  2.87*  2.84* 2.12*  2.10* 1.62*  1.54*  LATICACIDVEN  --   --  9.0* 3.6*  --   --   --   VANCORANDOM  --   --   --   --   --   --  15    Estimated Creatinine Clearance: 26.7 mL/min (A) (by C-G formula based on SCr of 1.62 mg/dL (H)).    Allergies  Allergen Reactions  . Erythromycin Nausea And Vomiting  . Quinapril Other (See Comments)    unknown  . Ciprofloxacin Hcl Nausea Only   Antimicrobials this admission: 10/18 unasyn >> 10/23   10/25 x1 dose 10/23 augmentin >>10/25 10/25  Fortaz>> 10/25 Vanc>>  Dose adjustments this admission: 10/20 Decrease Unasyn from q12h to q24h d/t rising SCr. 10/23 Inc Unasyn to 1.5 q12  10/25 MD changed augmentin to Unasyn 3 gm q12, renally adj to 1.5g q12h 10/25 vancomycin 1gm x 1 at 17:09 10/26 vancomycin 750mg  IV x 1 at 12:00 10/27 Vanco random at 03:42 = 15 mcg/mL, vancomycin 750mg  IV x 1 at 08:00  Microbiology results: 10/21 UCx: NGF 10/25 BCx: NGTD 10/25 Ucx: NGTD 10/25 MRSA PCR: negative  Thank you for allowing pharmacy to be a part of this patient's care.  Doreene Eland, PharmD, BCPS.   Pager: [phone] 08/08/2017 7:16 AM

## 2017-09-02 NOTE — Progress Notes (Signed)
No gross bleeding overnight. Discussed with husband will go ahead today with EGD ? Resume anticoagulation if negative for the DVT.

## 2017-09-02 NOTE — Progress Notes (Signed)
Patient ID: Malayiah Mcbrayer, female   DOB: 24-Dec-1940, 76 y.o.   MRN: [id number]    PROGRESS NOTE  Alysah Carton  OEV:[id number] DOB: [birth year] DOA: 08/20/2017  PCP: [name], MD   Brief Narrative:  Patient is 76 year old female with known and chronic diastolic CHF, nonischemic cardiomyopathy, hypertension, chronic leukocytosis, presented after an episode of near syncope. Patient was brought into the hospital day after syncopal event and was found to have hypercalcemia, leukocytosis, DVT in the left lower extremity. Patient was seen by oncology, underwent CT scan of the chest abdomen and pelvis which raises concern for uterine mass. Patient subsequently developed acute renal failure due to contrast, nephrology was consulted but has currently signed off his creatinine function has improved. Patient also underwent biopsy of the uterine/cervical mass on Monday, 08/27/2017.  Major events since admission:  10/24 to 10/25 - pt with multiple episodes of vomiting, more confusion and hypotension, SBP in 70's, required transfer to SDU 08/30/2017 am, blood work also notable for drop in Hg ~6 and 2 U PRBC ordered for transfusion. ABd XRAY worrisome for GOO. Surgery and PCCM consulted for assistance.   10/27 - still somewhat lethargic, blood bowel movement, consulted GI  10/28 - pt still rather somnolent but able to follow some commands, plan for endoscopy this AM   Assessment & Plan: Nausea and vomiting 10/24 - 10/25, blood in stool 10/27 - in pt with recent finding of uterine/vervical mass concerning for malignancy - initial concern for GOO - surgery consulted, NG tube was placed on October 25 - Patient has removed NG tube 10/26, per surgery, patient without any evidence of mechanical gastric outlet obstruction, OK to keep NGT out - due to blood in stool, GI consulted for assistance - plan for EGD today 10/28, if no ulcer, may be able to resume AC  Hypotension 10/25, septic shock 08/30/2017,  ? RML PNA - elevated lactic acid, pro calcitonin - Further drop in blood pressure exacerbated by acute blood loss anemia - Blood cultures have been obtained, still no growth to date  - patient had antibiotics broadened to vancomycin and Fortaz, we'll continue same regimen day #4 and narrow down as clinically indicated - Patient has received 2 units of PRBC October 25 with appropriate increase in posttransfusion hemoglobin - Please note that anticoagulation has been held due recurrent bleeding - due to bloody stool, Gi consulted, plan for EGD today - if no ulcers noted, may be able to resume AC - BP now more stable, lactic acid now cleared and is WNL - WBC is trending down overall and will continue to monitor while inpatient   Acute on chronic kidney disease stage II/non-anion gap metabolic acidosis - suspected to be contrast-induced initially  - Overall creatinine was trending down but overnight 10/25 due to vomiting and hypotension, Cr was up again 10/25 - initial Renal ultrasound did not show any signs of hydronephrosis, repeat ultrasound 08/30/2017, no evidence of obstruction - Nephrology team signed off 10/23 - Cr continues to improve, per RN still minimal urine output, suspect due to poor oral intake  - will repeat BMP in AM  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 Denman George - Recommendation is to obtain PET/CT scan but this will be scheduled as an outpatient - Dr. Maryland Pink discussed with Dr. Lebron Conners, recommend follow-up with Dr. Alvy Bimler in an outpatient setting -  I have discussed pathology report with Dr. Saralyn Pilar, preliminary report high suspicion for cervical, endometrial adenocarcinoma, poorly differentiated - I have discussed these findings with husband - I have spoke with GYN oncology oncall, treatment will likely be palliative in  nature, will ask Dr. Denman George to discuss with family possibly on Monday, I will route this note to Dr. Denman George - I will also place order for PCT for further [employer] discussions   Acute metabolic encephalopathy - due to septic shock, above-mentioned illnesses - still confusion, requiring mittens   Near syncope - Etiology not entirely clear, suspected dehydration - No recurrence since admission  Acute Left lower extremity DVT. - initially placed on Lovenox but due to worsening renal function, changed to heparin - anticoagulation discontinued due to acute blood loss anemia as noted above and ongoing bleed - if no ulcers on EGD, may be able to resume AC  Aspiration pneumonia (developed on 10/19 and pt started on Unasyn)  - on 10/25 developed septic shock, suspicion of new right middle lobe pneumonia - Antibiotics have been broadened to vancomycin and Fortaz as noted above, continue day #4  Leukocytosis. - elevated since admission and has remained elevated despite initial use of ABX for aspiration PNA (started on Unasyn 10/19) - thought to be due to underlying malignancy as well  - ABX have been broadened as noted above to cover for new developing RML PNA 10/25 - still on vanc and fortaz, day #4 - WBC is better this AM  - CBC in AM  Hypocalcemia, hypokalemia, hypomagnesemia - phosph stable - Mag and K still low, will continue to supplement - repeat electrolytes in AM  History of nonischemic cardiomyopathy/chronic diastolic dysfunction. - echo results outlined below  Diabetes mellitus type 2. - continue sliding scale insulin  History of essential hypertension - antihypertensive regimen on hold  Normocytic anemia. Acute blood loss anemia imposed on chronic anemia  - has been transfused 1 unit of blood on 08/24/2017 - 2 units PRBC transfused 08/30/2017 - GI consulted as noted above, plan for EGD today - CBC in AM  PCM, severe - nutritionist consulted  - hypoalbuminemia   DVT  prophylaxis: IV heparin stopped, changed to SCD Code Status: full code Family Communication: update husband over the phone  Disposition Plan: keep in step down unit  Consultants:   Medical oncology  GYN oncology  Nephrology - signed off 10/23  PCCM  Surgery   Pathologist Dr. Saralyn Pilar over the phone  GI  Procedures:   Left lower extremity venous Doppler, positive for acute DVT in the posterior tibial vein  Transthoracic echo EF 55-60% with grade 1 diastolic CHF  Antimicrobials:   Unasyn initiated on 08/23/2017, transition to oral Augmentin 08/28/2017 --> 10/24  Unasyn 10/24 --> 10/25  Vancomycin 10/25 -->  Fortaz 10/25 -->  Subjective: Pt more alert but still overall weak and tired, follows some commands.   Objective: Vitals:   08/31/2017 0200 08/06/2017 0400 09/04/2017 0409 08/12/2017 0600  BP: (!) 151/88 (!) 136/103  (!) 145/63  Pulse: (!) 105 (!) 113  (!) 102  Resp: 17 (!) 22  16  Temp: (!) 100.6 F (38.1 C) (!) 100.4 F (38 C)  (!) 100.6 F (38.1 C)  TempSrc:      SpO2: 96% 94%  97%  Weight:   65.6 kg (144 lb 10 oz)   Height:        Intake/Output Summary (Last 24 hours) at 08/21/2017 0834 Last data filed at 08/10/2017 0500  Gross  per 24 hour  Intake             1110 ml  Output              750 ml  Net              360 ml   Filed Weights   08/31/17 0500 09/01/17 0343 08/31/2017 0409  Weight: 78.6 kg (173 lb 4.5 oz) 64.4 kg (141 lb 15.6 oz) 65.6 kg (144 lb 10 oz)   Physical Exam  Constitutional: Appears frail, tired, weak, opens eyes and able to follow some commands  CVS: tachycardic, no gallops, no carotid bruit.  Pulmonary: diminished air movement throughout  Abdominal: Soft. BS +,  no distension, tenderness, rebound or guarding.  Musculoskeletal: able to move upper and lower extremities, slow and still weak  Neuro: somnolent but easy to awake, able to move upper and lower extremities but slow  Data Reviewed: I have personally reviewed following labs and  imaging studies  CBC:  Recent Labs Lab 08/29/17 0502 08/30/17 0425  08/31/17 0331  09/01/17 0336  09/01/17 1600 09/01/17 1919 08/25/2017 0026 08/25/2017 0342 08/17/2017 0609  WBC 25.9* 35.8*  --  44.2*  --  35.9*  --   --   --   --  18.6*  --   NEUTROABS  --   --   --  39.4*  --  33.0*  --   --   --   --  17.0*  --   HGB 8.1* 6.3*  < > 8.8*  8.8*  < > 8.7*  < > 8.5* 8.6* 8.8* 8.9* 8.1*  HCT 23.9* 18.7*  < > 24.5*  24.6*  < > 24.9*  < > 24.5* 24.7* 25.7* 26.3* 23.9*  MCV 86.6 87.0  --  84.5  --  86.8  --   --   --   --  88.6  --   PLT 488* 566*  --  380  --  302  --   --   --   --  251  --   < > = values in this interval not displayed. Basic Metabolic Panel:  Recent Labs Lab 08/28/17 0401 08/29/17 0502 08/30/17 0425 08/31/17 0331 09/01/17 0336 09/01/2017 0342  NA 134*  134* 135 139 143 139 139  K 3.5  3.5 3.3* 3.5 3.0* 3.7 3.1*  CL 107  107 101 94* 96* 96* 96*  CO2 17*  17* 23 24 32 28 29  GLUCOSE 126*  127* 131* 167* 104* 140* 113*  BUN 37*  37* 29* 36* 44* 39* 29*  CREATININE 2.56*  2.57* 1.83* 2.16* 2.87*  2.84* 2.12*  2.10* 1.62*  1.54*  CALCIUM 7.4*  7.4* 6.8* 7.0* 6.3* 7.2* 7.2*  MG  --   --   --  1.0* 1.8 1.3*  PHOS 2.6  --   --  4.5 3.1 2.9   Liver Function Tests:  Recent Labs Lab 08/28/17 0401 08/31/17 0331 09/01/17 0336 08/20/2017 0342  ALBUMIN 1.6* 1.7* 1.7* 1.7*   CBG:  Recent Labs Lab 09/01/17 1624 09/01/17 1924 09/01/17 2338 08/09/2017 0303 08/15/2017 0742  GLUCAP 122* 114* 111* 108* 102*   Urine analysis:    Component Value Date/Time   COLORURINE YELLOW 08/31/2017 2111   APPEARANCEUR CLOUDY (A) 08/31/2017 2111   LABSPEC 1.017 08/31/2017 2111   PHURINE 5.0 08/31/2017 2111   GLUCOSEU NEGATIVE 08/31/2017 2111   HGBUR MODERATE (A) 08/31/2017 2111   BILIRUBINUR NEGATIVE 08/31/2017 2111  KETONESUR 5 (A) 08/31/2017 2111   PROTEINUR 30 (A) 08/31/2017 2111   UROBILINOGEN 1.0 11/21/2010 2342   NITRITE NEGATIVE 08/31/2017 2111    LEUKOCYTESUR SMALL (A) 08/31/2017 2111   Recent Results (from the past 240 hour(s))  Culture, Urine     Status: None   Collection Time: 08/26/17  6:44 PM  Result Value Ref Range Status   Specimen Description URINE, RANDOM  Final   Special Requests NONE  Final   Culture   Final    NO GROWTH Performed at [facility], [place] [address] [place], [place] [address]    Report Status 08/28/2017 FINAL  Final  MRSA PCR Screening     Status: None   Collection Time: 08/30/17  8:07 AM  Result Value Ref Range Status   MRSA by PCR NEGATIVE NEGATIVE Final    Comment:        The GeneXpert MRSA Assay (FDA approved for NASAL specimens only), is one component of a comprehensive MRSA colonization surveillance program. It is not intended to diagnose MRSA infection nor to guide or monitor treatment for MRSA infections.   Culture, Urine     Status: None   Collection Time: 08/30/17  3:48 PM  Result Value Ref Range Status   Specimen Description URINE, CATHETERIZED  Final   Special Requests Normal  Final   Culture   Final    NO GROWTH Performed at [facility], [address], [place], [place] [address]    Report Status 08/31/2017 FINAL  Final  Culture, blood (routine x 2)     Status: None (Preliminary result)   Collection Time: 08/30/17  4:07 PM  Result Value Ref Range Status   Specimen Description BLOOD RIGHT ARM  Final   Special Requests IN PEDIATRIC BOTTLE Blood Culture adequate volume  Final   Culture   Final    NO GROWTH 2 DAYS Performed at [facility], [place] [address], [place], [place] [address]    Report Status PENDING  Incomplete  Culture, blood (routine x 2)     Status: None (Preliminary result)   Collection Time: 08/30/17  4:19 PM  Result Value Ref Range Status   Specimen Description BLOOD RIGHT HAND  Final   Special Requests IN PEDIATRIC BOTTLE Blood Culture adequate volume  Final   Culture   Final    NO GROWTH 2 DAYS Performed at [facility], [place] [address] [place],  [address]    Report Status PENDING  Incomplete    Radiology Studies: No results found.  Scheduled Meds: . insulin aspart  0-9 Units Subcutaneous Q4H  . mouth rinse  15 mL Mouth Rinse BID  . pantoprazole (PROTONIX) IV  40 mg Intravenous Q12H  . potassium chloride  40 mEq Oral Once  . potassium chloride  20 mEq Oral Once   Continuous Infusions: . cefTAZidime (FORTAZ)  IV 2 g (09/01/17 1800)  . dextrose 5 % and 0.45% NaCl 50 mL/hr at 09/01/17 1600  . magnesium sulfate 1 - 4 g bolus IVPB    . phenylephrine (NEO-SYNEPHRINE) Adult infusion Stopped (08/30/17 0950)  . vancomycin 750 mg (08/08/2017 0800)     LOS: 11 days   Time spent: 35 minutes   Faye Ramsay, MD [facility] Pager [phone]  If 7PM-7AM, please contact night-coverage [email or web address] Password [facility] 08/10/2017, 8:34 AM

## 2017-09-02 NOTE — Op Note (Signed)
[facility] Patient Name: Kimberly Castaneda Procedure Date: 08/25/2017 MRN: [id number] Attending MD: Nancy Fetter Dr., MD Date of Birth: [birth year] CSN: [id number] Age: 76 Admit Type: Inpatient Procedure:                Upper GI endoscopy Indications:              Iron deficiency anemia secondary to chronic blood                            loss, Occult blood in stool Uterine mass with                            bleeding Path shows poorly differentiated AdCA Providers:                Joyice Faster. Oletta Lamas, MD, Angus Seller, RN, Zenon Mayo, RN, Elspeth Cho, Technician Referring MD:              Medicines:                Fentanyl 25 micrograms IV, Midazolam 4 mg IV,                            Cetacaine spray Complications:            No immediate complications. Estimated Blood Loss:     Estimated blood loss was minimal. Procedure:                Pre-Anesthesia Assessment:                           - Prior to the procedure, a History and Physical                            was performed, and patient medications and                            allergies were reviewed. The patient's tolerance of                            previous anesthesia was also reviewed. The risks                            and benefits of the procedure and the sedation                            options and risks were discussed with the patient.                            All questions were answered, and informed consent                            was obtained. Prior Anticoagulants: The patient has  taken no previous anticoagulant or antiplatelet                            agents. ASA Grade Assessment: IV - A patient with                            severe systemic disease that is a constant threat                            to life. After reviewing the risks and benefits,                            the patient was deemed in satisfactory condition  to                            undergo the procedure.                           After obtaining informed consent, the endoscope was                            passed under direct vision. Throughout the                            procedure, the patient's blood pressure, pulse, and                            oxygen saturations were monitored continuously. The                            [id number] ([id number]) scope was introduced through the                            mouth, and advanced to the second part of duodenum.                            The upper GI endoscopy was accomplished without                            difficulty. The patient tolerated the procedure                            well. Scope In: Scope Out: Findings:      Many cratered, linear and superficial esophageal ulcers with oozing       blood and stigmata of recent bleeding were found in entire esophagus.       Biopsies were taken with a cold forceps for histology.      The stomach was normal.      Two non-obstructing non-bleeding cratered duodenal ulcers were found in       the duodenal bulb.      Many non-obstructing non-bleeding cratered duodenal ulcers with no       stigmata of bleeding were found in the second portion of the duodenum.       Biopsies were taken with  a cold forceps for histology. The entire 2nd       duodenum was essentially one large ulcer. The biopsies were obtained       from the edges.      A large fungating and ulcerated mass with no bleeding was found in the       second portion of the duodenum. Biopsies were taken with a cold forceps       for histology. Impression:               - Bleeding esophageal ulcers. Biopsied.                           - Normal stomach.                           - Multiple non-obstructing non-bleeding duodenal                            ulcers.                           - Multiple non-obstructing non-bleeding duodenal                            ulcers with no stigmata  of bleeding. Biopsied.                            Likely malignant                           - Rule out malignancy, duodenal mass. Biopsied.                            Likely malignant Moderate Sedation:      Moderate (conscious) sedation was administered by the endoscopy nurse       and supervised by the endoscopist. The following parameters were       monitored: oxygen saturation, heart rate, blood pressure, respiratory       rate, EKG, adequacy of pulmonary ventilation, and response to care. Recommendation:           - NPO.                           - Return patient to hospital ward for ongoing care.                           - Continue present medications.                           - Use sucralfate suspension 1 gram PO QID. Consider                            adding this tomorrow if the patient is alert enough                            to take it.                           -  Would consider having palliative care become                            involved in her care. These results have been                            discussed with her husband. Procedure Code(s):        --- Professional ---                           [phone], Esophagogastroduodenoscopy, flexible,                            transoral; with biopsy, single or multiple Diagnosis Code(s):        --- Professional ---                           K22.11, Ulcer of esophagus with bleeding                           K26.9, Duodenal ulcer, unspecified as acute or                            chronic, without hemorrhage or perforation                           K31.89, Other diseases of stomach and duodenum                           D50.0, Iron deficiency anemia secondary to blood                            loss (chronic)                           R19.5, Other fecal abnormalities CPT copyright 2016 [employer]. All rights reserved. The codes documented in this report are preliminary and upon coder review may  be  revised to meet current compliance requirements. Nancy Fetter Dr., MD 09/01/2017 12:49:01 PM This report has been signed electronically. Number of Addenda: 0

## 2017-09-02 NOTE — H&P (View-Only) (Signed)
[facility] CONSULT Reason for consult: G.I. bleeding Referring Physician: [facility] hospitalist. PCP: Dr. [name].  Kimberly Castaneda is an 76 y.o. female.  HPI: she was admitted with an episode of syncope. She was brought in by her husband. She has a history of nonischemic cardiomyopathy and type II diabetes with associated CKD. The patient's hospital course has been somewhat complicated. She was found to be hypercalcemic for unclear reasons and was seen by oncology who ordered CT scan that showed large mass in the uterus consistent with cancer. She was seen by Dr. Denman George, GYN oncologist, biopsy showed poorly differentiated adenocarcinoma. Unfortunately the contrast for this CT scan resulted in acute renal failure and she has been followed by Dr. Barry Dienes of nephrology. This was felt to be due to the contrast material on top of her diabetic CKD. This is beginning to improve. She was found to have a DVT and was started on anticoagulation. There was some shortness of breath and she was seen by critical care medicine who felt that she did not have a PE may have pneumonia. She has had 3 units of blood since admission but none over the last 2 days. She has had a dark melenic stools. She has been on IV Protonix. She is also being treated with antibiotics for pneumonia. We were asked to see her for her G.I. Bleeding. The patient is quite confused and is restrained in bed with mittens. She does respond it stated to me that she lives in [place] and is only in [place] visiting. She denied any pain or any other symptoms. I was unable to reach her husband but did reach her son who stated that she does in fact live in [place] and has for the past 30 years.  Past Medical History:  Diagnosis Date  . CKD (chronic kidney disease) stage 3, GFR 30-59 ml/min (HCC)   . Diabetes mellitus ([place])    type 2  . Hypercholesterolemia   . Mitral regurgitation   . Nonischemic cardiomyopathy ([place])   . Systemic  hypertension   . Vertigo     Past Surgical History:  Procedure Laterality Date  . CARDIAC CATHETERIZATION  07/18/2010   r/t nonsichemic cardiomyopathy; normal coronary anatomy; EF 20%;   . TONSILLECTOMY    . TUBAL LIGATION    . US ECHOCARDIOGRAPHY  04/03/2012   EF 55%; mild LVH    History reviewed. No pertinent family history.  Social History:  reports that she has never smoked. She has never used smokeless tobacco. She reports that she does not drink alcohol or use drugs.  Allergies:  Allergies  Allergen Reactions  . Erythromycin Nausea And Vomiting  . Quinapril Other (See Comments)    unknown  . Ciprofloxacin Hcl Nausea Only    Medications; Prior to Admission medications   Medication Sig Start Date End Date Taking? Authorizing Provider  alendronate (FOSAMAX) 70 MG tablet Take 70 mg by mouth once a week.  04/02/13  Yes [provider]  Ascorbic Acid (VITA-C PO) Take 1 tablet by mouth daily.   Yes [provider]  aspirin 81 MG tablet Take 81 mg by mouth daily.   Yes [provider]  atorvastatin (LIPITOR) 20 MG tablet Take 20 mg by mouth daily.   Yes [provider]  carvedilol (COREG) 12.5 MG tablet Take 1 tablet (12.5 mg total) by mouth 2 (two) times daily. Patient taking differently: Take 25 mg by mouth 2 (two) times daily.  07/05/17  Yes Erlene Quan, PA-C  cholecalciferol (VITAMIN D) 1000 UNITS tablet Take 1,000 Units by mouth daily.   Yes [provider]  fluticasone (FLONASE) 50 MCG/ACT nasal spray Place 1-2 sprays into both nostrils daily as needed for allergies.  02/17/13  Yes [provider]  hydrochlorothiazide (MICROZIDE) 12.5 MG capsule Take 12.5 mg by mouth daily.   Yes [provider]  linagliptin (TRADJENTA) 5 MG TABS tablet Take 5 mg by mouth daily.   Yes [provider]  OMEGA 3 1200 MG CAPS Take 1,200 mg by mouth daily.   Yes [provider]  ramipril (ALTACE) 10 MG tablet Take  10 mg by mouth 2 (two) times daily.   Yes [provider]   . insulin aspart  0-9 Units Subcutaneous Q4H  . mouth rinse  15 mL Mouth Rinse BID  . pantoprazole (PROTONIX) IV  40 mg Intravenous Q12H  . potassium chloride  20 mEq Oral Once   PRN Meds acetaminophen **OR** acetaminophen, hydrALAZINE, ondansetron (ZOFRAN) IV, promethazine Results for orders placed or performed during the hospital encounter of 08/06/2017 (from the past 48 hour(s))  Glucose, capillary     Status: Abnormal   Collection Time: 08/30/17 11:41 AM  Result Value Ref Range   Glucose-Capillary 161 (H) 65 - 99 mg/dL   Comment 1 Notify RN    Comment 2 Document in Chart   Hemoglobin and hematocrit, blood     Status: Abnormal   Collection Time: 08/30/17 12:38 PM  Result Value Ref Range   Hemoglobin 11.7 (L) 12.0 - 15.0 g/dL    Comment: DELTA CHECK NOTED POST TRANSFUSION SPECIMEN    HCT 34.3 (L) 36.0 - 46.0 %  Procalcitonin - Baseline     Status: None   Collection Time: 08/30/17  1:03 PM  Result Value Ref Range   Procalcitonin 27.63 ng/mL    Comment:        Interpretation: PCT >= 10 ng/mL: Important systemic inflammatory response, almost exclusively due to severe bacterial sepsis or septic shock. (NOTE)         ICU PCT Algorithm               Non ICU PCT Algorithm    ----------------------------     ------------------------------         PCT < 0.25 ng/mL                 PCT < 0.1 ng/mL     Stopping of antibiotics            Stopping of antibiotics       strongly encouraged.               strongly encouraged.    ----------------------------     ------------------------------       PCT level decrease by               PCT < 0.25 ng/mL       >= 80% from peak PCT       OR PCT 0.25 - 0.5 ng/mL          Stopping of antibiotics                                             encouraged.     Stopping of antibiotics           encouraged.    ----------------------------     ------------------------------  PCT  level decrease by              PCT >= 0.25 ng/mL       < 80% from peak PCT        AND PCT >= 0.5 ng/mL             Continuing antibiotics                                              encouraged.       Continuing antibiotics            encouraged.    ----------------------------     ------------------------------     PCT level increase compared          PCT > 0.5 ng/mL         with peak PCT AND          PCT >= 0.5 ng/mL             Escalation of antibiotics                                          strongly encouraged.      Escalation of antibiotics        strongly encouraged.   Lactic acid, plasma     Status: Abnormal   Collection Time: 08/30/17  1:03 PM  Result Value Ref Range   Lactic Acid, Venous 9.0 (HH) 0.5 - 1.9 mmol/L    Comment: CRITICAL RESULT CALLED TO, READ BACK BY AND VERIFIED WITH: MCNABB,B. RN AT 5465 08/30/17 MULLINS,T   Culture, Urine     Status: None   Collection Time: 08/30/17  3:48 PM  Result Value Ref Range   Specimen Description URINE, CATHETERIZED    Special Requests Normal    Culture      NO GROWTH Performed at [facility], [place] [address], [place], [place] [address]    Report Status 08/31/2017 FINAL   Glucose, capillary     Status: Abnormal   Collection Time: 08/30/17  4:05 PM  Result Value Ref Range   Glucose-Capillary 167 (H) 65 - 99 mg/dL  Hemoglobin and hematocrit, blood     Status: Abnormal   Collection Time: 08/30/17  4:07 PM  Result Value Ref Range   Hemoglobin 10.3 (L) 12.0 - 15.0 g/dL   HCT 29.6 (L) 36.0 - 46.0 %  Lactic acid, plasma     Status: Abnormal   Collection Time: 08/30/17  4:07 PM  Result Value Ref Range   Lactic Acid, Venous 3.6 (HH) 0.5 - 1.9 mmol/L    Comment: CRITICAL RESULT CALLED TO, READ BACK BY AND VERIFIED WITH: MCNABB,B @ 1702 ON [id number] BY POTEAT,S   Culture, blood (routine x 2)     Status: None (Preliminary result)   Collection Time: 08/30/17  4:07 PM  Result Value Ref Range   Specimen Description BLOOD  RIGHT ARM    Special Requests IN PEDIATRIC BOTTLE Blood Culture adequate volume    Culture      NO GROWTH 2 DAYS Performed at [facility], [place] [address], [place], [place] [address]    Report Status PENDING   Culture, blood (routine x 2)     Status: None (Preliminary result)  Collection Time: 08/30/17  4:19 PM  Result Value Ref Range   Specimen Description BLOOD RIGHT HAND    Special Requests IN PEDIATRIC BOTTLE Blood Culture adequate volume    Culture      NO GROWTH 2 DAYS Performed at [facility], [place] [address], [place], [place] [address]    Report Status PENDING   Glucose, capillary     Status: Abnormal   Collection Time: 08/30/17  7:27 PM  Result Value Ref Range   Glucose-Capillary 148 (H) 65 - 99 mg/dL  Hemoglobin and hematocrit, blood     Status: Abnormal   Collection Time: 08/30/17  8:06 PM  Result Value Ref Range   Hemoglobin 9.4 (L) 12.0 - 15.0 g/dL   HCT 26.7 (L) 36.0 - 46.0 %  Glucose, capillary     Status: Abnormal   Collection Time: 08/30/17  8:55 PM  Result Value Ref Range   Glucose-Capillary 143 (H) 65 - 99 mg/dL  Glucose, capillary     Status: Abnormal   Collection Time: 08/30/17 11:07 PM  Result Value Ref Range   Glucose-Capillary 121 (H) 65 - 99 mg/dL  Hemoglobin and hematocrit, blood     Status: Abnormal   Collection Time: 08/31/17 12:10 AM  Result Value Ref Range   Hemoglobin 9.1 (L) 12.0 - 15.0 g/dL   HCT 25.4 (L) 36.0 - 46.0 %  Hemoglobin and hematocrit, blood     Status: Abnormal   Collection Time: 08/31/17  3:31 AM  Result Value Ref Range   Hemoglobin 8.8 (L) 12.0 - 15.0 g/dL   HCT 24.6 (L) 36.0 - 46.0 %  Heparin level (unfractionated)     Status: Abnormal   Collection Time: 08/31/17  3:31 AM  Result Value Ref Range   Heparin Unfractionated <0.10 (L) 0.30 - 0.70 IU/mL    Comment:        IF HEPARIN RESULTS ARE BELOW EXPECTED VALUES, AND PATIENT DOSAGE HAS BEEN CONFIRMED, SUGGEST FOLLOW UP TESTING OF ANTITHROMBIN III LEVELS.    Renal function panel     Status: Abnormal   Collection Time: 08/31/17  3:31 AM  Result Value Ref Range   Sodium 143 135 - 145 mmol/L   Potassium 3.0 (L) 3.5 - 5.1 mmol/L   Chloride 96 (L) 101 - 111 mmol/L   CO2 32 22 - 32 mmol/L   Glucose, Bld 104 (H) 65 - 99 mg/dL   BUN 44 (H) 6 - 20 mg/dL   Creatinine, Ser 2.87 (H) 0.44 - 1.00 mg/dL   Calcium 6.3 (LL) 8.9 - 10.3 mg/dL    Comment: CRITICAL RESULT CALLED TO, READ BACK BY AND VERIFIED WITH: A SAWYER RN 08/31/17 0424 A NAVARRO    Phosphorus 4.5 2.5 - 4.6 mg/dL   Albumin 1.7 (L) 3.5 - 5.0 g/dL   GFR calc non Af Amer 15 (L) >60 mL/min   GFR calc Af Amer 17 (L) >60 mL/min    Comment: (NOTE) The eGFR has been calculated using the CKD EPI equation. This calculation has not been validated in all clinical situations. eGFR's persistently <60 mL/min signify possible Chronic Kidney Disease.    Anion gap 15 5 - 15  Magnesium     Status: Abnormal   Collection Time: 08/31/17  3:31 AM  Result Value Ref Range   Magnesium 1.0 (L) 1.7 - 2.4 mg/dL  CBC with Differential/Platelet     Status: Abnormal   Collection Time: 08/31/17  3:31 AM  Result Value Ref Range  WBC 44.2 (H) 4.0 - 10.5 K/uL   RBC 2.90 (L) 3.87 - 5.11 MIL/uL   Hemoglobin 8.8 (L) 12.0 - 15.0 g/dL   HCT 24.5 (L) 36.0 - 46.0 %   MCV 84.5 78.0 - 100.0 fL   MCH 30.3 26.0 - 34.0 pg   MCHC 35.9 30.0 - 36.0 g/dL   RDW 15.1 11.5 - 15.5 %   Platelets 380 150 - 400 K/uL   Neutrophils Relative % 89 %   Lymphocytes Relative 8 %   Monocytes Relative 3 %   Eosinophils Relative 0 %   Basophils Relative 0 %   Neutro Abs 39.4 (H) 1.7 - 7.7 K/uL   Lymphs Abs 3.5 0.7 - 4.0 K/uL   Monocytes Absolute 1.3 (H) 0.1 - 1.0 K/uL   Eosinophils Absolute 0.0 0.0 - 0.7 K/uL   Basophils Absolute 0.0 0.0 - 0.1 K/uL   WBC Morphology TOXIC GRANULATION   Procalcitonin     Status: None   Collection Time: 08/31/17  3:31 AM  Result Value Ref Range   Procalcitonin 29.71 ng/mL    Comment:         Interpretation: PCT >= 10 ng/mL: Important systemic inflammatory response, almost exclusively due to severe bacterial sepsis or septic shock. (NOTE)         ICU PCT Algorithm               Non ICU PCT Algorithm    ----------------------------     ------------------------------         PCT < 0.25 ng/mL                 PCT < 0.1 ng/mL     Stopping of antibiotics            Stopping of antibiotics       strongly encouraged.               strongly encouraged.    ----------------------------     ------------------------------       PCT level decrease by               PCT < 0.25 ng/mL       >= 80% from peak PCT       OR PCT 0.25 - 0.5 ng/mL          Stopping of antibiotics                                             encouraged.     Stopping of antibiotics           encouraged.    ----------------------------     ------------------------------       PCT level decrease by              PCT >= 0.25 ng/mL       < 80% from peak PCT        AND PCT >= 0.5 ng/mL             Continuing antibiotics                                              encouraged.       Continuing antibiotics  encouraged.    ----------------------------     ------------------------------     PCT level increase compared          PCT > 0.5 ng/mL         with peak PCT AND          PCT >= 0.5 ng/mL             Escalation of antibiotics                                          strongly encouraged.      Escalation of antibiotics        strongly encouraged.   Creatinine, serum     Status: Abnormal   Collection Time: 08/31/17  3:31 AM  Result Value Ref Range   Creatinine, Ser 2.84 (H) 0.44 - 1.00 mg/dL   GFR calc non Af Amer 15 (L) >60 mL/min   GFR calc Af Amer 18 (L) >60 mL/min    Comment: (NOTE) The eGFR has been calculated using the CKD EPI equation. This calculation has not been validated in all clinical situations. eGFR's persistently <60 mL/min signify possible Chronic Kidney Disease.   Glucose,  capillary     Status: Abnormal   Collection Time: 08/31/17  3:44 AM  Result Value Ref Range   Glucose-Capillary 103 (H) 65 - 99 mg/dL  Glucose, capillary     Status: Abnormal   Collection Time: 08/31/17  7:52 AM  Result Value Ref Range   Glucose-Capillary 116 (H) 65 - 99 mg/dL   Comment 1 Notify RN    Comment 2 Document in Chart   Hemoglobin and hematocrit, blood     Status: Abnormal   Collection Time: 08/31/17  9:03 AM  Result Value Ref Range   Hemoglobin 8.6 (L) 12.0 - 15.0 g/dL   HCT 24.4 (L) 36.0 - 46.0 %  Hemoglobin and hematocrit, blood     Status: Abnormal   Collection Time: 08/31/17 12:01 PM  Result Value Ref Range   Hemoglobin 9.0 (L) 12.0 - 15.0 g/dL   HCT 26.3 (L) 36.0 - 46.0 %  Glucose, capillary     Status: Abnormal   Collection Time: 08/31/17 12:55 PM  Result Value Ref Range   Glucose-Capillary 119 (H) 65 - 99 mg/dL   Comment 1 Notify RN    Comment 2 Document in Chart   Glucose, capillary     Status: Abnormal   Collection Time: 08/31/17  4:13 PM  Result Value Ref Range   Glucose-Capillary 132 (H) 65 - 99 mg/dL   Comment 1 Notify RN    Comment 2 Document in Chart   Hemoglobin and hematocrit, blood     Status: Abnormal   Collection Time: 08/31/17  4:24 PM  Result Value Ref Range   Hemoglobin 8.3 (L) 12.0 - 15.0 g/dL   HCT 24.0 (L) 36.0 - 46.0 %  Glucose, capillary     Status: Abnormal   Collection Time: 08/31/17  7:30 PM  Result Value Ref Range   Glucose-Capillary 148 (H) 65 - 99 mg/dL   Comment 1 Notify RN    Comment 2 Document in Chart   Hemoglobin and hematocrit, blood     Status: Abnormal   Collection Time: 08/31/17  8:05 PM  Result Value Ref Range   Hemoglobin 8.1 (L) 12.0 - 15.0 g/dL   HCT 23.3 (L) 36.0 -  46.0 %  Urinalysis, Routine w reflex microscopic     Status: Abnormal   Collection Time: 08/31/17  9:11 PM  Result Value Ref Range   Color, Urine YELLOW YELLOW   APPearance CLOUDY (A) CLEAR   Specific Gravity, Urine 1.017 1.005 - 1.030   pH 5.0  5.0 - 8.0   Glucose, UA NEGATIVE NEGATIVE mg/dL   Hgb urine dipstick MODERATE (A) NEGATIVE   Bilirubin Urine NEGATIVE NEGATIVE   Ketones, ur 5 (A) NEGATIVE mg/dL   Protein, ur 30 (A) NEGATIVE mg/dL   Nitrite NEGATIVE NEGATIVE   Leukocytes, UA SMALL (A) NEGATIVE   RBC / HPF 6-30 0 - 5 RBC/hpf   WBC, UA 6-30 0 - 5 WBC/hpf   Bacteria, UA RARE (A) NONE SEEN   Squamous Epithelial / LPF 0-5 (A) NONE SEEN   Mucus PRESENT    Hyaline Casts, UA PRESENT   Occult blood card to lab, stool     Status: Abnormal   Collection Time: 08/31/17  9:12 PM  Result Value Ref Range   Fecal Occult Bld POSITIVE (A) NEGATIVE  Hemoglobin and hematocrit, blood     Status: Abnormal   Collection Time: 08/31/17 10:37 PM  Result Value Ref Range   Hemoglobin 8.0 (L) 12.0 - 15.0 g/dL   HCT 23.6 (L) 36.0 - 46.0 %  Glucose, capillary     Status: Abnormal   Collection Time: 08/31/17 11:13 PM  Result Value Ref Range   Glucose-Capillary 125 (H) 65 - 99 mg/dL   Comment 1 Notify RN    Comment 2 Document in Chart   Glucose, capillary     Status: Abnormal   Collection Time: 09/01/17  3:13 AM  Result Value Ref Range   Glucose-Capillary 125 (H) 65 - 99 mg/dL   Comment 1 Notify RN    Comment 2 Document in Chart   Renal function panel     Status: Abnormal   Collection Time: 09/01/17  3:36 AM  Result Value Ref Range   Sodium 139 135 - 145 mmol/L   Potassium 3.7 3.5 - 5.1 mmol/L    Comment: DELTA CHECK NOTED   Chloride 96 (L) 101 - 111 mmol/L   CO2 28 22 - 32 mmol/L   Glucose, Bld 140 (H) 65 - 99 mg/dL   BUN 39 (H) 6 - 20 mg/dL   Creatinine, Ser 2.12 (H) 0.44 - 1.00 mg/dL   Calcium 7.2 (L) 8.9 - 10.3 mg/dL   Phosphorus 3.1 2.5 - 4.6 mg/dL   Albumin 1.7 (L) 3.5 - 5.0 g/dL   GFR calc non Af Amer 22 (L) >60 mL/min   GFR calc Af Amer 25 (L) >60 mL/min    Comment: (NOTE) The eGFR has been calculated using the CKD EPI equation. This calculation has not been validated in all clinical situations. eGFR's persistently <60  mL/min signify possible Chronic Kidney Disease.    Anion gap 15 5 - 15  Magnesium     Status: None   Collection Time: 09/01/17  3:36 AM  Result Value Ref Range   Magnesium 1.8 1.7 - 2.4 mg/dL  CBC with Differential/Platelet     Status: Abnormal   Collection Time: 09/01/17  3:36 AM  Result Value Ref Range   WBC 35.9 (H) 4.0 - 10.5 K/uL    Comment: WHITE COUNT CONFIRMED ON SMEAR   RBC 2.87 (L) 3.87 - 5.11 MIL/uL   Hemoglobin 8.7 (L) 12.0 - 15.0 g/dL   HCT 24.9 (L) 36.0 - 46.0 %  MCV 86.8 78.0 - 100.0 fL   MCH 30.3 26.0 - 34.0 pg   MCHC 34.9 30.0 - 36.0 g/dL   RDW 15.4 11.5 - 15.5 %   Platelets 302 150 - 400 K/uL   Neutrophils Relative % 92 %   Lymphocytes Relative 5 %   Monocytes Relative 3 %   Eosinophils Relative 0 %   Basophils Relative 0 %   Neutro Abs 33.0 (H) 1.7 - 7.7 K/uL   Lymphs Abs 1.8 0.7 - 4.0 K/uL   Monocytes Absolute 1.1 (H) 0.1 - 1.0 K/uL   Eosinophils Absolute 0.0 0.0 - 0.7 K/uL   Basophils Absolute 0.0 0.0 - 0.1 K/uL   RBC Morphology POLYCHROMASIA PRESENT   Procalcitonin     Status: None   Collection Time: 09/01/17  3:36 AM  Result Value Ref Range   Procalcitonin 12.88 ng/mL    Comment:        Interpretation: PCT >= 10 ng/mL: Important systemic inflammatory response, almost exclusively due to severe bacterial sepsis or septic shock. (NOTE)         ICU PCT Algorithm               Non ICU PCT Algorithm    ----------------------------     ------------------------------         PCT < 0.25 ng/mL                 PCT < 0.1 ng/mL     Stopping of antibiotics            Stopping of antibiotics       strongly encouraged.               strongly encouraged.    ----------------------------     ------------------------------       PCT level decrease by               PCT < 0.25 ng/mL       >= 80% from peak PCT       OR PCT 0.25 - 0.5 ng/mL          Stopping of antibiotics                                             encouraged.     Stopping of antibiotics            encouraged.    ----------------------------     ------------------------------       PCT level decrease by              PCT >= 0.25 ng/mL       < 80% from peak PCT        AND PCT >= 0.5 ng/mL             Continuing antibiotics                                              encouraged.       Continuing antibiotics            encouraged.    ----------------------------     ------------------------------     PCT level increase compared          PCT > 0.5 ng/mL  with peak PCT AND          PCT >= 0.5 ng/mL             Escalation of antibiotics                                          strongly encouraged.      Escalation of antibiotics        strongly encouraged.   Creatinine, serum     Status: Abnormal   Collection Time: 09/01/17  3:36 AM  Result Value Ref Range   Creatinine, Ser 2.10 (H) 0.44 - 1.00 mg/dL   GFR calc non Af Amer 22 (L) >60 mL/min   GFR calc Af Amer 25 (L) >60 mL/min    Comment: (NOTE) The eGFR has been calculated using the CKD EPI equation. This calculation has not been validated in all clinical situations. eGFR's persistently <60 mL/min signify possible Chronic Kidney Disease.   Glucose, capillary     Status: Abnormal   Collection Time: 09/01/17  7:23 AM  Result Value Ref Range   Glucose-Capillary 162 (H) 65 - 99 mg/dL   Comment 1 Notify RN    Comment 2 Document in Chart   Hemoglobin and hematocrit, blood     Status: Abnormal   Collection Time: 09/01/17  8:24 AM  Result Value Ref Range   Hemoglobin 8.3 (L) 12.0 - 15.0 g/dL   HCT 24.1 (L) 36.0 - 46.0 %    Dg Abd 1 View  Result Date: 08/31/2017 CLINICAL DATA:  Nasogastric tube placement.  Initial encounter. EXAM: ABDOMEN - 1 VIEW COMPARISON:  Abdominal radiograph performed 08/30/2017 FINDINGS: The patient's enteric tube is seen ending overlying the pylorus or first segment of the duodenum. The stomach is partially filled with air. No free intra-abdominal air is seen, though evaluation for free air is  limited on a single supine view. No acute osseous abnormalities are identified. Flowing osteophytes are seen along the lower thoracic and upper lumbar spine. IMPRESSION: Enteric tube noted ending overlying the pylorus or first segment of the duodenum. Electronically Signed   By: Garald Balding M.D.   On: 08/31/2017 01:54   US Renal  Result Date: 08/30/2017 CLINICAL DATA:  Oliguria for today. EXAM: RENAL / URINARY TRACT ULTRASOUND COMPLETE COMPARISON:  None. FINDINGS: Right Kidney: Length: 9.4 cm. Echogenicity within normal limits. There is cortical thinning. No mass or hydronephrosis visualized. Left Kidney: Length: 10.4 cm. Echogenicity within normal limits. There is cortical thinning. No mass or hydronephrosis visualized. Bladder: The bladder is decompressed with question Foley catheter in place. Trace ascites and right pleural effusion are noted. There are gallstones and sludge in the gallbladder. There is a complex masslike lesion in the midline to left pelvis. IMPRESSION: Bilateral renal cortical thinning. No acute abnormalities identified in the kidney. Gallstones and sludge within the gallbladder. There is a question complex masslike lesion in the midline to left pelvis. Trace ascites and right pleural effusion noted. Electronically Signed   By: Abelardo Diesel M.D.   On: 08/30/2017 21:41   Dg Chest Port 1 View  Result Date: 08/30/2017 CLINICAL DATA:  Cough, recent small bowel obstruction, diabetes EXAM: PORTABLE CHEST 1 VIEW COMPARISON:  CT chest of 08/23/2017 and chest x-ray of 08/06/2017 FINDINGS: There is peripheral opacity in the right mid lung suspicious for a patchy area of pneumonia. The left lung is clear. A  tiny right pleural effusion cannot be excluded. Mediastinal and hilar contours are unremarkable and the heart remains within upper limits of normal. NG tube extends below the hemidiaphragm. IMPRESSION: 1. Peripheral opacity in the right mid lung may represent a small focus of pneumonia.  2. Cannot exclude a tiny right pleural effusion. Electronically Signed   By: Ivar Drape M.D.   On: 08/30/2017 15:54   Dg Abd Portable 1v  Result Date: 08/31/2017 CLINICAL DATA:  Gastric distention . EXAM: PORTABLE ABDOMEN - 1 VIEW COMPARISON:  08/31/2017 . FINDINGS: Interim removal of NG tube. Mild gastric distention noted. No evidence of small bowel distention. No free air. Vascular calcification. Degenerative changes lumbar spine and both hips. IMPRESSION: Interim removal of NG tube.  Mild gastric distention noted. Electronically Signed   By: Marcello Moores  Register   On: 08/31/2017 06:46   ROS: essentially unobtainable due to her confusion            Blood pressure (!) 162/76, pulse 96, temperature 99.9 F (37.7 C), temperature source Core (Comment), resp. rate 17, height 5' 2"  (1.575 m), weight 64.4 kg (141 lb 15.6 oz), SpO2 96 %.  Physical exam:   General-- patient restrained in bed with mittens on her hands and quite confused but in no distress ENT-- nonicteric Neck-- appears to have full range of motion Heart-- regular rate and rhythm without murmurs gallops Lungs-- clear Abdomen-- soft and nontender Psych-- as above  Assessment: 1. G.I. Bleed. This is likely due to an ulcer. She has had a somewhat dilated stomach. We've been unable to contact her husband but I have discussed with her son. I think she will need an EGD when she is more alert. She is on Protonix and is not required transfusion in 2 days and her hemoglobin has been relatively stable. 2. Uterine cancer. Is being followed by GYN oncology 3. Questionable sepsis. There is a pneumonia seen on CT of the chest but no PE. On antibiotics 4. CKD. She has had ARF after contrast with a CT scan and has been followed by nephrology 5. Marked confusion. This is probably multifactorial 6. Aspiration pneumonia 7. DVT 8. Diabetes type II with CKD  Plan: patient appears to be stable currently and I suspect that she has an ulcer.  I have discussed this with her son. At this point, we will obtain consent from her husband when we are able to contact him and will make her NPO. If she is improved in the morning will go ahead with bedside EGD or she becomes unstable during the night we will perform EGD. I would prefer to wait until her acute confusion has improved.   Louan Base JR,Anitra Doxtater L 09/01/2017, 11:08 AM   This note was created using voice recognition software and minor errors may Have occurred unintentionally. Pager: [phone] If no answer or after hours call [phone]

## 2017-09-03 ENCOUNTER — Encounter (HOSPITAL_COMMUNITY): Payer: Self-pay | Admitting: Gastroenterology

## 2017-09-03 DIAGNOSIS — Z66 Do not resuscitate: Secondary | ICD-10-CM

## 2017-09-03 DIAGNOSIS — D638 Anemia in other chronic diseases classified elsewhere: Secondary | ICD-10-CM

## 2017-09-03 DIAGNOSIS — Z515 Encounter for palliative care: Secondary | ICD-10-CM

## 2017-09-03 DIAGNOSIS — N888 Other specified noninflammatory disorders of cervix uteri: Secondary | ICD-10-CM

## 2017-09-03 LAB — CBC WITH DIFFERENTIAL/PLATELET
BASOS ABS: 0 10*3/uL (ref 0.0–0.1)
BASOS PCT: 0 %
Eosinophils Absolute: 0.1 10*3/uL (ref 0.0–0.7)
Eosinophils Relative: 1 %
HEMATOCRIT: 26.5 % — AB (ref 36.0–46.0)
HEMOGLOBIN: 8.8 g/dL — AB (ref 12.0–15.0)
Lymphocytes Relative: 5 %
Lymphs Abs: 0.9 10*3/uL (ref 0.7–4.0)
MCH: 29.7 pg (ref 26.0–34.0)
MCHC: 33.2 g/dL (ref 30.0–36.0)
MCV: 89.5 fL (ref 78.0–100.0)
Monocytes Absolute: 0.7 10*3/uL (ref 0.1–1.0)
Monocytes Relative: 4 %
NEUTROS ABS: 15.7 10*3/uL — AB (ref 1.7–7.7)
NEUTROS PCT: 90 %
Platelets: 234 10*3/uL (ref 150–400)
RBC: 2.96 MIL/uL — AB (ref 3.87–5.11)
RDW: 16.2 % — ABNORMAL HIGH (ref 11.5–15.5)
WBC: 17.4 10*3/uL — ABNORMAL HIGH (ref 4.0–10.5)

## 2017-09-03 LAB — MAGNESIUM: MAGNESIUM: 1.6 mg/dL — AB (ref 1.7–2.4)

## 2017-09-03 LAB — RENAL FUNCTION PANEL
Albumin: 1.6 g/dL — ABNORMAL LOW (ref 3.5–5.0)
Anion gap: 13 (ref 5–15)
BUN: 20 mg/dL (ref 6–20)
CHLORIDE: 96 mmol/L — AB (ref 101–111)
CO2: 29 mmol/L (ref 22–32)
Calcium: 7.3 mg/dL — ABNORMAL LOW (ref 8.9–10.3)
Creatinine, Ser: 1.21 mg/dL — ABNORMAL HIGH (ref 0.44–1.00)
GFR calc Af Amer: 49 mL/min — ABNORMAL LOW (ref >60)
GFR calc non Af Amer: 43 mL/min — ABNORMAL LOW (ref >60)
GLUCOSE: 98 mg/dL (ref 65–99)
PHOSPHORUS: 2.8 mg/dL (ref 2.5–4.6)
POTASSIUM: 2.6 mmol/L — AB (ref 3.5–5.1)
Sodium: 138 mmol/L (ref 135–145)

## 2017-09-03 LAB — CREATININE, SERUM
Creatinine, Ser: 1.23 mg/dL — ABNORMAL HIGH (ref 0.44–1.00)
GFR, EST AFRICAN AMERICAN: 48 mL/min — AB (ref >60)
GFR, EST NON AFRICAN AMERICAN: 42 mL/min — AB (ref >60)

## 2017-09-03 LAB — GLUCOSE, CAPILLARY
GLUCOSE-CAPILLARY: 88 mg/dL (ref 65–99)
GLUCOSE-CAPILLARY: 74 mg/dL (ref 65–99)
GLUCOSE-CAPILLARY: 77 mg/dL (ref 65–99)
Glucose-Capillary: 79 mg/dL (ref 65–99)

## 2017-09-03 MED ORDER — MORPHINE SULFATE (CONCENTRATE) 10 MG/0.5ML PO SOLN: 5.0000 mg | ORAL | Status: DC | PRN

## 2017-09-03 MED ORDER — MAGNESIUM SULFATE 2 GM/50ML IV SOLN
2.0000 g | Freq: Once | INTRAVENOUS | Status: AC
  Administered 2017-09-03: 2 g via INTRAVENOUS
  Filled 2017-09-03: qty 50

## 2017-09-03 MED ORDER — VANCOMYCIN HCL IN DEXTROSE 750-5 MG/150ML-% IV SOLN: 750.0000 mg | INTRAVENOUS | Status: DC

## 2017-09-03 MED ORDER — POTASSIUM CHLORIDE 10 MEQ/100ML IV SOLN
10.0000 meq | INTRAVENOUS | Status: AC
  Administered 2017-09-03 (×6): 10 meq via INTRAVENOUS
  Filled 2017-09-03 (×3): qty 100

## 2017-09-03 MED ORDER — MORPHINE SULFATE (PF) 4 MG/ML IV SOLN
2.0000 mg | Freq: Four times a day (QID) | INTRAVENOUS | Status: DC
  Administered 2017-09-03 – 2017-09-06 (×11): 2 mg via INTRAVENOUS
  Filled 2017-09-03 (×11): qty 1

## 2017-09-03 MED ORDER — LORAZEPAM 2 MG/ML IJ SOLN
1.0000 mg | INTRAMUSCULAR | Status: DC | PRN
  Administered 2017-09-04 (×2): 1 mg via INTRAVENOUS
  Filled 2017-09-03 (×2): qty 1

## 2017-09-03 NOTE — Progress Notes (Signed)
Initial Nutrition Assessment  DOCUMENTATION CODES:   Not applicable  INTERVENTION:    Monitor for diet advancement/toleration  Provide Premier Protein BID, each supplement provides 160kcal and 30g protein once advanced.   Monitor and supplement electrolytes as needed per MD descretion.   NUTRITION DIAGNOSIS:   Inadequate oral intake related to inability to eat as evidenced by NPO status.  GOAL:   Patient will meet greater than or equal to 90% of their needs  MONITOR:   PO intake, Supplement acceptance, Weight trends, Labs, Diet advancement  REASON FOR ASSESSMENT:   Consult Assessment of nutrition requirement/status  ASSESSMENT:   Pt with PMH significant for CKD, DM, HTN, and nonischemic cardiomyopathy. Admitted for syncopal episode 10/16. Pt was found to be hypercalcemic and was seen by oncology after CT scan showed a large mass in the uterus. The biopsy resulted as poorly differentiated adenocarcinoma. The use of CT scan contrast resulted in ARF. Pt had breathing trouble which Critical Care says is consistent with PNA. Gastroenterology consulted for dark melenic stools.   Pt unable to answer questions at this time. Spoke with husband at bedside who reports pt had poor PO intake for one month prior to admission. Husband states he would make pt's favorite foods and she would only take a couple of bites. Pt has not moved past full liquid diet this hospital stay. Meal completions haven't been charted in 5 days.    Husband reports pt's UBW is around 140 lb. Records indicate pt has maintained her weight of 135-140 lb since August 2018. Weight records are limited.   Nutrition-Focused physical exam completed. No muscle or fat depletions noted  Medications reviewed and include: SSI, KCl, IV abx. NaCl with D5 @ 50 ml/hr Labs reviewed: K 2.6 (L) Creatinine 1.23 (H) Mg 1.6 (L)  Diet Order:  Diet NPO time specified  EDUCATION NEEDS:   Not appropriate for education at this  time  Skin:  Skin Assessment: Skin Integrity Issues: Skin Integrity Issues:: Stage I Stage I: coccyx  Last BM:  09/03/17  Height:   Ht Readings from Last 1 Encounters:  08/30/17 5\' 2"  (1.575 m)    Weight:   Wt Readings from Last 1 Encounters:  09/03/17 138 lb 7.2 oz (62.8 kg)    Ideal Body Weight:  50 kg  BMI:  Body mass index is 25.32 kg/m.  Estimated Nutritional Needs:   Kcal:  1550-1750 kcal (25-28 kcal/kg)  Protein:  75-85 grams (1.2-1.4 g/kg)  Fluid:  >1.5 L/day    Mariana Single RD, LDN Clinical Nutrition Pager # - [phone]

## 2017-09-03 NOTE — Progress Notes (Signed)
Patient ID: Kimberly Castaneda, female   DOB: 1941-07-27, 76 y.o.   MRN: [id number]    PROGRESS NOTE  Nattaly Yebra  YTK:[id number] DOB: Jun 11, 1941 DOA: 08/19/2017  PCP: [name], MD   Brief Narrative:  Patient is 76 year old female with known and chronic diastolic CHF, nonischemic cardiomyopathy, hypertension, chronic leukocytosis, presented after an episode of near syncope. Patient was brought into the hospital day after syncopal event and was found to have hypercalcemia, leukocytosis, DVT in the left lower extremity. Patient was seen by oncology, underwent CT scan of the chest abdomen and pelvis which raises concern for uterine mass. Patient subsequently developed acute renal failure due to contrast, nephrology was consulted but has currently signed off his creatinine function has improved. Patient also underwent biopsy of the uterine/cervical mass on Monday, 08/27/2017.  Major events since admission:  10/24 to 10/25 - pt with multiple episodes of vomiting, more confusion and hypotension, SBP in 70's, required transfer to SDU 08/30/2017 am, blood work also notable for drop in Hg ~6 and 2 U PRBC ordered for transfusion. ABd XRAY worrisome for GOO. Surgery and PCCM consulted for assistance.   10/27 - still somewhat lethargic, blood bowel movement, consulted GI  10/28 - pt still rather somnolent but able to follow some commands, plan for endoscopy this AM 10/29 - pt still somnolent, difficult to awake, no change overnight   Assessment & Plan: Nausea and vomiting 10/24 - 10/25, blood in stool 10/27 - in pt with recent finding of uterine/vervical mass concerning for malignancy - initial concern for GOO - surgery consulted, NG tube was placed on October 25 - Patient has removed NG tube 10/26, per surgery, patient without any evidence of mechanical gastric outlet obstruction, OK to keep NGT out - due to blood in stool, GI consulted for assistance - EGD done 10/28 notable for severe  esophagitis and duodenal ulcers, possibly malignant but not clear at this time, biopsies taken - d/w GI Dr. Oletta Lamas, no [facility], keep NPO, prognosis guarded, recommended focus on comfort  - will d/w family as well   Hypotension 10/25, septic shock 08/30/2017, ? RML PNA - elevated lactic acid, pro calcitonin - Further drop in blood pressure exacerbated by acute blood loss anemia - Blood cultures have been obtained, still no growth to date  - patient had antibiotics broadened to vancomycin and Fortaz, we'll continue same regimen day #5 and narrow down as clinically indicated - Patient has received 2 units of PRBC October 25 with appropriate increase in posttransfusion hemoglobin - Please note that anticoagulation has been held due recurrent bleeding and based on EGD results, per GI absolutely no AC  - WBC is trending down   Acute on chronic kidney disease stage II/non-anion gap metabolic acidosis - suspected to be contrast-induced initially  - Overall creatinine was trending down but overnight 10/25 due to vomiting and hypotension, Cr was up again 10/25 - initial Renal ultrasound did not show any signs of hydronephrosis, repeat ultrasound 08/30/2017, no evidence of obstruction - Nephrology team signed off 10/23 - Cr continues trending down - BMP in AM  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 Denman George - Recommendation is to obtain PET/CT scan but this will be scheduled as an outpatient - Dr. Maryland Pink discussed with Dr. Lebron Conners, recommend follow-up with Dr. Alvy Bimler in an outpatient setting - I have discussed pathology report with  Dr. Saralyn Pilar, preliminary report high suspicion for cervical, endometrial adenocarcinoma, poorly differentiated - I have discussed these findings with husband - I have spoke with GYN oncology oncall, treatment will likely  be palliative in nature, will ask Dr. Denman George to discuss with family - per EGD report, duodenal ulcers possibly malignant as well, per Dr. Oletta Lamas, it is possible that primary malignancy is GI related, biopsy report is pending  - appreciate PCT input as well   Acute metabolic encephalopathy - due to septic shock, above-mentioned illnesses - still rather somnolent, confused   Near syncope - Etiology not entirely clear, suspected dehydration - No recurrence since admission  Acute Left lower extremity DVT. - initially placed on Lovenox but due to worsening renal function, changed to heparin - anticoagulation discontinued due to acute blood loss anemia as noted above and ongoing bleed - no AC given EGD findings   Aspiration pneumonia (developed on 10/19 and pt started on Unasyn)  - on 10/25 developed septic shock, suspicion of new right middle lobe pneumonia - Antibiotics have been broadened to vancomycin and Fortaz as noted above, continue day #5  Leukocytosis. - elevated since admission and has remained elevated despite initial use of ABX for aspiration PNA (started on Unasyn 10/19) - thought to be due to underlying malignancy as well  - ABX have been broadened as noted above to cover for new developing RML PNA 10/25 - still on vanc and fortaz, day #5 - overall better   Hypocalcemia, hypokalemia, hypomagnesemia - phosph stable - Mag and K still low, will continue to supplement   History of nonischemic cardiomyopathy/chronic diastolic dysfunction. - echo results outlined below  Diabetes mellitus type 2. - continue sliding scale insulin  History of essential hypertension - antihypertensive regimen on hold  Normocytic anemia. Acute blood loss anemia imposed on chronic anemia  - has been transfused 1 unit of blood on 08/24/2017 - 2 units PRBC transfused 08/30/2017 - Hg overall stable  PCM, severe - nutritionist consulted  - hypoalbuminemia   DVT prophylaxis: IV  heparin stopped, changed to SCD Code Status: full code Family Communication: update husband over the phone  Disposition Plan: keep in step down unit  Consultants:   Medical oncology  GYN oncology  Nephrology - signed off 10/23  PCCM  Surgery   Pathologist Dr. Saralyn Pilar over the phone  GI  PCT  Procedures:   Left lower extremity venous Doppler, positive for acute DVT in the posterior tibial vein  Transthoracic echo EF 55-60% with grade 1 diastolic CHF  Antimicrobials:   Unasyn initiated on 08/23/2017, transition to oral Augmentin 08/28/2017 --> 10/24  Unasyn 10/24 --> 10/25  Vancomycin 10/25 -->  Fortaz 10/25 -->  Subjective: Still very somnolent. No events overnight.   Objective: Vitals:   09/03/17 0000 09/03/17 0100 09/03/17 0500 09/03/17 0600  BP:  (!) 108/55 (!) 103/50 (!) 117/54  Pulse: (!) 105     Resp: (!) 26 (!) 22 (!) 23 19  Temp: 99.5 F (37.5 C) 99.5 F (37.5 C) 99 F (37.2 C) 99 F (37.2 C)  TempSrc: Oral     SpO2:      Weight:   62.8 kg (138 lb 7.2 oz)   Height:        Intake/Output Summary (Last 24 hours) at 09/03/17 0743 Last data filed at 09/03/17 0200  Gross per 24 hour  Intake             1370 ml  Output  450 ml  Net              920 ml   Filed Weights   09/01/17 0343 08/29/2017 0409 09/03/17 0500  Weight: 64.4 kg (141 lb 15.6 oz) 65.6 kg (144 lb 10 oz) 62.8 kg (138 lb 7.2 oz)   Physical Exam  Constitutional: Appears somnolent, difficult to awake, NAD  CVS: RRR, S1/S2 +, no murmurs, no gallops, no carotid bruit.  Pulmonary: poor inspiratory effort, diminished breath sounds at bases  Abdominal: Soft. BS +,  no distension, tenderness, rebound or guarding.  Musculoskeletal: No edema, not able to move extremities due to somnolence   Data Reviewed: I have personally reviewed following labs and imaging studies  CBC:  Recent Labs Lab 08/30/17 0425  08/31/17 0331  09/01/17 0336  09/01/2017 0342 08/30/2017 0609  08/23/2017 0758 08/10/2017 1138 09/03/17 0355  WBC 35.8*  --  44.2*  --  35.9*  --  18.6*  --   --   --  17.4*  NEUTROABS  --   --  39.4*  --  33.0*  --  17.0*  --   --   --  15.7*  HGB 6.3*  < > 8.8*  8.8*  < > 8.7*  < > 8.9* 8.1* 8.3* 8.1* 8.8*  HCT 18.7*  < > 24.5*  24.6*  < > 24.9*  < > 26.3* 23.9* 24.0* 23.3* 26.5*  MCV 87.0  --  84.5  --  86.8  --  88.6  --   --   --  89.5  PLT 566*  --  380  --  302  --  251  --   --   --  234  < > = values in this interval not displayed. Basic Metabolic Panel:  Recent Labs Lab 08/28/17 0401  08/30/17 0425 08/31/17 0331 09/01/17 0336 08/23/2017 0342 09/03/17 0355  NA 134*  134*  < > 139 143 139 139 138  K 3.5  3.5  < > 3.5 3.0* 3.7 3.1* 2.6*  CL 107  107  < > 94* 96* 96* 96* 96*  CO2 17*  17*  < > 24 32 28 29 29   GLUCOSE 126*  127*  < > 167* 104* 140* 113* 98  BUN 37*  37*  < > 36* 44* 39* 29* 20  CREATININE 2.56*  2.57*  < > 2.16* 2.87*  2.84* 2.12*  2.10* 1.62*  1.54* 1.21*  1.23*  CALCIUM 7.4*  7.4*  < > 7.0* 6.3* 7.2* 7.2* 7.3*  MG  --   --   --  1.0* 1.8 1.3* 1.6*  PHOS 2.6  --   --  4.5 3.1 2.9 2.8  < > = values in this interval not displayed. Liver Function Tests:  Recent Labs Lab 08/28/17 0401 08/31/17 0331 09/01/17 0336 08/24/2017 0342 09/03/17 0355  ALBUMIN 1.6* 1.7* 1.7* 1.7* 1.6*   CBG:  Recent Labs Lab 08/09/2017 1637 08/07/2017 1939 08/13/2017 2320 09/03/17 0343 09/03/17 0721  GLUCAP 86 77 92 88 74   Urine analysis:    Component Value Date/Time   COLORURINE YELLOW 08/31/2017 2111   APPEARANCEUR CLOUDY (A) 08/31/2017 2111   LABSPEC 1.017 08/31/2017 2111   PHURINE 5.0 08/31/2017 2111   GLUCOSEU NEGATIVE 08/31/2017 2111   HGBUR MODERATE (A) 08/31/2017 2111   BILIRUBINUR NEGATIVE 08/31/2017 2111   KETONESUR 5 (A) 08/31/2017 2111   PROTEINUR 30 (A) 08/31/2017 2111   UROBILINOGEN 1.0 11/21/2010 2342   NITRITE NEGATIVE 08/31/2017 2111  LEUKOCYTESUR SMALL (A) 08/31/2017 2111   Recent Results (from  the past 240 hour(s))  Culture, Urine     Status: None   Collection Time: 08/26/17  6:44 PM  Result Value Ref Range Status   Specimen Description URINE, RANDOM  Final   Special Requests NONE  Final   Culture   Final    NO GROWTH Performed at [facility], [address] [place], [place] [address]    Report Status 08/28/2017 FINAL  Final  MRSA PCR Screening     Status: None   Collection Time: 08/30/17  8:07 AM  Result Value Ref Range Status   MRSA by PCR NEGATIVE NEGATIVE Final    Comment:        The GeneXpert MRSA Assay (FDA approved for NASAL specimens only), is one component of a comprehensive MRSA colonization surveillance program. It is not intended to diagnose MRSA infection nor to guide or monitor treatment for MRSA infections.   Culture, Urine     Status: None   Collection Time: 08/30/17  3:48 PM  Result Value Ref Range Status   Specimen Description URINE, CATHETERIZED  Final   Special Requests Normal  Final   Culture   Final    NO GROWTH Performed at [facility], [address], [place], [place] [address]    Report Status 08/31/2017 FINAL  Final  Culture, blood (routine x 2)     Status: None (Preliminary result)   Collection Time: 08/30/17  4:07 PM  Result Value Ref Range Status   Specimen Description BLOOD RIGHT ARM  Final   Special Requests IN PEDIATRIC BOTTLE Blood Culture adequate volume  Final   Culture   Final    NO GROWTH 3 DAYS Performed at [facility], [place] [address], [place], [place] [address]    Report Status PENDING  Incomplete  Culture, blood (routine x 2)     Status: None (Preliminary result)   Collection Time: 08/30/17  4:19 PM  Result Value Ref Range Status   Specimen Description BLOOD RIGHT HAND  Final   Special Requests IN PEDIATRIC BOTTLE Blood Culture adequate volume  Final   Culture   Final    NO GROWTH 3 DAYS Performed at [facility], [place] [address], [place], [place] [address]    Report Status PENDING   Incomplete    Radiology Studies: No results found.  Scheduled Meds: . insulin aspart  0-9 Units Subcutaneous Q4H  . mouth rinse  15 mL Mouth Rinse BID  . pantoprazole (PROTONIX) IV  40 mg Intravenous Q12H  . potassium chloride  40 mEq Oral Once  . potassium chloride  20 mEq Oral Once   Continuous Infusions: . cefTAZidime (FORTAZ)  IV 2 g (08/08/2017 1808)  . dextrose 5 % and 0.45% NaCl 50 mL/hr at 09/03/17 0200  . phenylephrine (NEO-SYNEPHRINE) Adult infusion Stopped (08/30/17 0950)  . potassium chloride 10 mEq (09/03/17 0549)     LOS: 12 days   Time spent: 35 minutes   Faye Ramsay, MD [facility] Pager [phone]  If 7PM-7AM, please contact night-coverage [email or web address] Password [email or web address] 09/03/2017, 7:43 AM

## 2017-09-03 NOTE — Progress Notes (Signed)
Pharmacy Antibiotic Note  Kimberly Castaneda is a 76 y.o. female admitted on 08/28/2017 with syncope, new DVT.  She is known to pharmacy from antibiotic & anticoagulant dosing.   She is currently on Day#13 antibiotics for aspiration PNA.  She is deteriorating clinically and antibiotics are being broadened to coverage hospital acquired pathogens.   Pharmacy has been consulted for Vancomycin & Fortaz dosing.  09/03/2017: Day #4 current antibiotics, Day #13 overall antibiotic therapy  WBC improving, today at 17.4  Procalcitonin trending down  Tmax 100.4 (low grade fevers started early this am)  Renal: Scr improving (UOP acceptable at 0.5-0.7 ml/kg/h)   Plan:  Kimberly Castaneda 2gm IV q24h - remains dose appropriately  Vancomycin 750mg  IV x 1 given on 10/28  With improvement in renal function, will schedule vancomycin 750 mg IV q48h  Monitor renal function and cx data   Daily Scr  Narrow antibiotics as clinically indicated, now having low-grade fevers  Height: 5\' 2"  (157.5 cm) Weight: 138 lb 7.2 oz (62.8 kg) IBW/kg (Calculated) : 50.1  Temp (24hrs), Avg:99.4 F (37.4 C), Min:98.8 F (37.1 C), Max:100 F (37.8 C)   Recent Labs Lab 08/30/17 0425 08/30/17 1303 08/30/17 1607 08/31/17 0331 09/01/17 0336 08/13/2017 0342 08/10/2017 0914 09/03/17 0355  WBC 35.8*  --   --  44.2* 35.9* 18.6*  --  17.4*  CREATININE 2.16*  --   --  2.87*  2.84* 2.12*  2.10* 1.62*  1.54*  --  1.21*  1.23*  LATICACIDVEN  --  9.0* 3.6*  --   --   --  1.1  --   VANCORANDOM  --   --   --   --   --  15  --   --     Estimated Creatinine Clearance: 35 mL/min (A) (by C-G formula based on SCr of 1.21 mg/dL (H)).    Allergies  Allergen Reactions  . Erythromycin Nausea And Vomiting  . Quinapril Other (See Comments)    unknown  . Ciprofloxacin Hcl Nausea Only   Antimicrobials this admission: 10/18 unasyn >> 10/23   10/25 x1 dose 10/23 augmentin >>10/25 10/25 Fortaz>> 10/25 Vanc>>  Dose adjustments this  admission: 10/20 Decrease Unasyn from q12h to q24h d/t rising SCr. 10/23 Inc Unasyn to 1.5 q12  10/25 MD changed augmentin to Unasyn 3 gm q12, renally adj to 1.5g q12h 10/25 vancomycin 1gm x 1 at 17:09 10/26 vancomycin 750mg  IV x 1 at 12:00 10/27 Vanco random at 03:42 = 15 mcg/mL, vancomycin 750mg  IV x 1 at 08:00   Microbiology results: 10/21 UCx: NGF 10/25 BCx: NGTD 10/25 Ucx: NGTD 10/25 MRSA PCR: negative  Thank you for allowing pharmacy to be a part of this patient's care.   Kimberly Castaneda, PharmD, BCPS Pager [phone] 09/03/2017 8:44 AM

## 2017-09-03 NOTE — Progress Notes (Signed)
[facility] Gastroenterology Progress Note  Kimberly Castaneda 76 y.o. 09-09-1941   Subjective: Lying in bed asleep but follows basic commands. Unable to tell me if she has abdominal pain.  Objective: Vital signs: Vitals:   09/03/17 0700 09/03/17 0800  BP: (!) 129/58 (!) 150/65  Pulse: 96 (!) 101  Resp: (!) 22 (!) 26  Temp: 99 F (37.2 C) 99.1 F (37.3 C)  SpO2: 98% 98%    Physical Exam: Gen: somnolent, elderly, frail, no acute distress  CV: RRR Chest: CTA B Abd: diffuse tenderness with guarding, +distention, +BS  Lab Results:  Recent Labs  08/07/2017 0342 09/03/17 0355  NA 139 138  K 3.1* 2.6*  CL 96* 96*  CO2 29 29  GLUCOSE 113* 98  BUN 29* 20  CREATININE 1.62*  1.54* 1.21*  1.23*  CALCIUM 7.2* 7.3*  MG 1.3* 1.6*  PHOS 2.9 2.8    Recent Labs  09/01/2017 0342 09/03/17 0355  ALBUMIN 1.7* 1.6*    Recent Labs  09/01/2017 0342  08/20/2017 1138 09/03/17 0355  WBC 18.6*  --   --  17.4*  NEUTROABS 17.0*  --   --  15.7*  HGB 8.9*  < > 8.1* 8.8*  HCT 26.3*  < > 23.3* 26.5*  MCV 88.6  --   --  89.5  PLT 251  --   --  234  < > = values in this interval not displayed.    Assessment/Plan: Severe ulcerative esophagitis and duodenal ulcers - Hgb stable. 2 black stools last night. Continue Protonix 40 mg IV Q 12 hours. Need to avoid anticoagulation if possible due to extensive peptic ulcer disease she is high risk for rebleeding. When mental status improves can give clear liquids. Will follow.   Nord C. 09/03/2017, 8:41 AM  Pager [phone]  AFTER 5 PM or on weekends please call [phone] ID: Kimberly Castaneda, female   DOB: September 08, 1941, 76 y.o.   MRN: [id number]

## 2017-09-03 NOTE — Consult Note (Signed)
Consultation Note Date: 09/03/2017   Patient Name: Kimberly Castaneda  DOB: [birth year]  MRN: [id number]  Age / Sex: 76 y.o., female  PCP: [name], MD Referring Physician: Theodis Blaze, MD  Reason for Consultation: Establishing goals of care and Psychosocial/spiritual support  HPI/Patient Profile: 76 y.o. female  admitted on 08/06/2017 with past medical   history of chronic diastolic dysfunction, nonischemic cardiomyopathy, dyslipidemia, hypertension, DM type II, chronic leukocytosis going back at least one year if not more (per her primary care physician) admitted through the emergency room after a fall at home.  Per her husband she has had continued physical and functional decline over the past year, along with very  poor oral intake  CT abdo/pelvis on 08/23/17 showed Enlarged abnormal appearing uterus with nodular areas of enhancement, areas of cystic change/necrosis and internal gas most likely representing a neoplasm, question cervical cancer versus endometrial cancer.   Diffuse stranding of surrounding fat planes cannot exclude tumor extension, with suspected small parametrial nodule on RIGHT 5 mm diameter, LEFT ureteral nodule/ metastasis 4 mm diameter, and involvement of the distal sigmoid colon.  Patient and family face advance directive to [place], treatment options decisions, and anticipatory care needs.  Clinical Assessment and Goals of Care:   This NP Wadie Lessen reviewed medical records, received report from team, assessed the patient and then meet at the patient's bedside along with her family to include husband, son/and his wife, and daughter to discuss diagnosis, prognosis, GOC, EOL wishes disposition and options.  Concept of Hospice and Palliative Care were discussed  A detailed discussion was had today regarding advanced directives.  Concepts specific to code status, artifical feeding  and hydration, continued IV antibiotics and rehospitalization was had.  The difference between a aggressive medical intervention path  and a palliative comfort care path for this patient at this time was had.  Values and goals of care important to patient and family were attempted to be elicited.  All family members expressed their understanding that the patient clearly verbalized that  "if she ever got a cancer diagnosis I do not want to do anything about it".    Actually family believe that the patient had known for quite some time just how sick she was, had discussed it with her primary care doctor [name], and had clearly made her decision regarding any diagnostics or aggressive treatments.  Knowing how the patient felt, and what she valued in regard to quality of life family are all in agreement today to shift to a full comfort path.  MOST form completed to reflect full comfort.   No desire for life prolonging measures, and allow natural death.  Natural trajectory and expectations at EOL were discussed.  Questions and concerns addressed.   Family encouraged to call with questions or concerns.  PMT will continue to support holistically.  There is no documented healthcare power of attorney, her husband is acting in her best interest, and both children support his decisions  SUMMARY OF RECOMMENDATIONS    Code Status/Advance  Care Planning:  DNR   Symptom Management:   Pain/dyspnea: Morphine 2 mg IV every 6 hours scheduled                               Morphine/Roxanol 5 mg SL every 1 hour as needed *     Agitation: Ativan 1 mg IV every 4 hours prn    Palliative Prophylaxis:   Aspiration, Bowel Regimen, Delirium Protocol, Frequent Pain Assessment and Oral Care  Additional Recommendations (Limitations, Scope, Preferences):  Full Comfort Care  Psycho-social/Spiritual:   Desire for further Chaplaincy support:yes  Additional Recommendations: Education on  Hospice  Prognosis:   < 2 weeks  Discharge Planning: Hospice facility      Primary Diagnoses: Present on Admission: . Syncope, vasovagal . Chronic diastolic CHF (congestive heart failure), NYHA class 1 ([place]) . CKD (chronic kidney disease) stage 3, GFR 30-59 ml/min (HCC) . Dyslipidemia . Hypercalcemia . Leukocytosis   I have reviewed the medical record, interviewed the patient and family, and examined the patient. The following aspects are pertinent.  Past Medical History:  Diagnosis Date  . CKD (chronic kidney disease) stage 3, GFR 30-59 ml/min (HCC)   . Diabetes mellitus ([place])    type 2  . Hypercholesterolemia   . Mitral regurgitation   . Nonischemic cardiomyopathy ([place])   . Systemic hypertension   . Vertigo    Social History   Social History  . Marital status: Married    Spouse name: N/A  . Number of children: N/A  . Years of education: N/A   Social History Main Topics  . Smoking status: Never Smoker  . Smokeless tobacco: Never Used  . Alcohol use No  . Drug use: No  . Sexual activity: Not Asked   Other Topics Concern  . None   Social History Narrative  . None   History reviewed. No pertinent family history. Scheduled Meds: . insulin aspart  0-9 Units Subcutaneous Q4H  . mouth rinse  15 mL Mouth Rinse BID  . pantoprazole (PROTONIX) IV  40 mg Intravenous Q12H  . potassium chloride  40 mEq Oral Once  . potassium chloride  20 mEq Oral Once   Continuous Infusions: . cefTAZidime (FORTAZ)  IV 2 g (09/04/2017 1808)  . dextrose 5 % and 0.45% NaCl 50 mL/hr at 09/03/17 0600  . phenylephrine (NEO-SYNEPHRINE) Adult infusion Stopped (08/30/17 0950)  . potassium chloride Stopped (09/03/17 1029)  . [START ON 09/04/2017] vancomycin     PRN Meds:.acetaminophen **OR** acetaminophen, hydrALAZINE, LORazepam, ondansetron (ZOFRAN) IV, promethazine Medications Prior to Admission:  Prior to Admission medications   Medication Sig Start Date End Date Taking? Authorizing  Provider  alendronate (FOSAMAX) 70 MG tablet Take 70 mg by mouth once a week.  04/02/13  Yes [provider]  Ascorbic Acid (VITA-C PO) Take 1 tablet by mouth daily.   Yes [provider]  aspirin 81 MG tablet Take 81 mg by mouth daily.   Yes [provider]  atorvastatin (LIPITOR) 20 MG tablet Take 20 mg by mouth daily.   Yes [provider]  carvedilol (COREG) 12.5 MG tablet Take 1 tablet (12.5 mg total) by mouth 2 (two) times daily. Patient taking differently: Take 25 mg by mouth 2 (two) times daily.  07/05/17  Yes Kilroy, Lurena Joiner K, PA-C  cholecalciferol (VITAMIN D) 1000 UNITS tablet Take 1,000 Units by mouth daily.   Yes [provider]  fluticasone (FLONASE) 50 MCG/ACT  nasal spray Place 1-2 sprays into both nostrils daily as needed for allergies.  02/17/13  Yes [provider]  hydrochlorothiazide (MICROZIDE) 12.5 MG capsule Take 12.5 mg by mouth daily.   Yes [provider]  linagliptin (TRADJENTA) 5 MG TABS tablet Take 5 mg by mouth daily.   Yes [provider]  OMEGA 3 1200 MG CAPS Take 1,200 mg by mouth daily.   Yes [provider]  ramipril (ALTACE) 10 MG tablet Take 10 mg by mouth 2 (two) times daily.   Yes [provider]   Allergies  Allergen Reactions  . Erythromycin Nausea And Vomiting  . Quinapril Other (See Comments)    unknown  . Ciprofloxacin Hcl Nausea Only   Review of Systems  Unable to perform ROS: Mental status change    Physical Exam  Constitutional: She appears lethargic. She appears cachectic. She appears ill.  Cardiovascular: Tachycardia present.   Pulmonary/Chest: She has decreased breath sounds in the right lower field and the left lower field.  Neurological: She appears lethargic.  Skin: Skin is warm and dry.    Vital Signs: BP (!) 150/65 (BP Location: Left Arm)   Pulse (!) 101   Temp 99.1 F (37.3 C) (Core (Comment))   Resp (!) 26   Ht 5\' 2"  (1.575 m)   Wt 62.8 kg  (138 lb 7.2 oz)   SpO2 98%   BMI 25.32 kg/m  Pain Assessment: CPOT POSS *See Group Information*: 2-Acceptable,Slightly drowsy, easily aroused Pain Score: Asleep   SpO2: SpO2: 98 % O2 Device:SpO2: 98 % O2 Flow Rate: .O2 Flow Rate (L/min): 4 L/min  IO: Intake/output summary:  Intake/Output Summary (Last 24 hours) at 09/03/17 1001 Last data filed at 09/03/17 0915  Gross per 24 hour  Intake             1420 ml  Output              900 ml  Net              520 ml    LBM: Last BM Date: 09/01/2017 Baseline Weight: Weight: 54.9 kg (121 lb 0.5 oz) Most recent weight: Weight: 62.8 kg (138 lb 7.2 oz)     Palliative Assessment/Data: 20 %    Discussed  with Dr. Doyle Askew   palliative medicine will continue to support holistically.  Family encouraged to call with questions or concerns.  Time In: 1300 Time Out: 1415 Time Total: 75 min Greater than 50%  of this time was spent counseling and coordinating care related to the above assessment and plan.  Signed by: Wadie Lessen, NP   Please contact Palliative Medicine Team phone at [phone] for questions and concerns.  For individual provider: See Shea Evans

## 2017-09-03 NOTE — Progress Notes (Signed)
Date:  September 03, 2017 Chart reviewed for concurrent status and case management needs.  Will continue to follow patient progress.  Discharge Planning: following for needs  Expected discharge date: [id number]  Rhonda Davis, BSN, RN3, CCM   [phone]  

## 2017-09-04 LAB — CULTURE, BLOOD (ROUTINE X 2)
CULTURE: NO GROWTH
CULTURE: NO GROWTH
SPECIAL REQUESTS: ADEQUATE
Special Requests: ADEQUATE

## 2017-09-04 MED ORDER — MORPHINE SULFATE (CONCENTRATE) 10 MG/0.5ML PO SOLN: 5.0000 mg | ORAL | Status: AC | PRN

## 2017-09-04 NOTE — Progress Notes (Signed)
LCSW consulted for residential hospice placement:  [facility]  LCSW went by patient's room and patient was sleeping with no family in room. Husband contacted via phone and discussed consult, role and next steps regarding patient and family's wishes for comfort.  Husband only open to [facility] for hospice residential placement. Referral called in to [facility] with [facility] who will review and work up.  Currently there are no beds at [facility] per hospital liaison. LCSW updated NP with palliative care regarding plans and she is in agreement.  Will continue to follow and assist with transition to comfort.  Lane Hacker, MSW Clinical Social Work: [employer] Coverage for :  [phone]

## 2017-09-04 NOTE — Discharge Summary (Signed)
Physician Discharge Summary  Kimberly Castaneda ZOX:[id number] DOB: 07-25-1941 DOA: 08/22/2017  PCP: [name], MD  Admit date: 09/05/2017 Discharge date: 09/04/2017  Recommendations for Outpatient Follow-up:  1. Pt will be discharged to residential hospice   Discharge Diagnoses:  Active Problems:   CKD (chronic kidney disease) stage 3, GFR 30-59 ml/min (HCC)   Cervical mass   Palliative care by specialist   DNR (do not resuscitate)  Discharge Condition: Stable  Diet recommendation: Heart healthy diet discussed in details   History of present illness:  Patient is 76 year old female with known and chronic diastolic CHF, nonischemic cardiomyopathy, hypertension, chronic leukocytosis, presented after an episode of near syncope. Patient was brought into the hospital day after syncopal event and was found to have hypercalcemia, leukocytosis, DVT in the left lower extremity. Patient was seen by oncology, underwent CT scan of the chest abdomen and pelvis which raises concern for uterine mass. Patient subsequently developed acute renal failure due to contrast, nephrology was consulted but has currently signed off his creatinine function has improved. Patient also underwent biopsy of the uterine/cervical mass on Monday, 08/27/2017.  Major events since admission:  10/24 to 10/25 - pt with multiple episodes of vomiting, more confusion and hypotension, SBP in 70's, required transfer to SDU 08/30/2017 am, blood work also notable for drop in Hg ~6 and 2 U PRBC ordered for transfusion. ABd XRAY worrisome for GOO. Surgery and PCCM consulted for assistance.   10/27 - still somewhat lethargic, blood bowel movement, consulted GI  10/28 - pt still rather somnolent but able to follow some commands, plan for endoscopy this AM 10/29 - pt still somnolent, difficult to awake, no change overnight   Assessment & Plan: Nausea and vomiting 10/24 - 10/25, blood in stool 10/27 - in pt with recent finding  of uterine/vervical mass concerning for malignancy - initial concern for GOO - surgery consulted, NG tube was placed on October 25 - Patient has removed NG tube 10/26, per surgery, patient without any evidence of mechanical gastric outlet obstruction, OK to keep NGT out - due to blood in stool, GI consulted for assistance - EGD done 10/28 notable for severe esophagitis and duodenal ulcers, possibly malignant but not clear at this time, biopsies taken - d/w GI Dr. Oletta Lamas, no [facility], keep NPO, prognosis guarded, recommended focus on comfort  - family in agreement, continue with full comfort care   Hypotension 10/25, septic shock 08/30/2017, ? RML PNA - elevated lactic acid, pro calcitonin - Further drop in blood pressure exacerbated by acute blood loss anemia - Blood cultures have been obtained, still no growth to date  - patient had antibiotics broadened to vancomycin and Fortaz, we'll continue same regimen day #5 and narrow down as clinically indicated - Patient has received 2 units of PRBC October 25 with appropriate increase in posttransfusion hemoglobin - Please note that anticoagulation has been held due recurrent bleeding and based on EGD results, per GI absolutely no AC  - no further blood work in an effort to ensure comfort   Acute on chronic kidney disease stage II/non-anion gap metabolic acidosis - suspected to be contrast-induced initially  - Overall creatinine was trending down but overnight 10/25 due to vomiting and hypotension, Cr was up again 10/25 - initial Renal ultrasound did not show any signs of hydronephrosis, repeat ultrasound 08/30/2017, no evidence of obstruction - Nephrology team signed off 10/23 - Cr has trended down, pt with no oral intake, now somnolent, unable to eat   Uterine/cervical  mass concerning for malignancy/likely reason for abdominal pain - this was seen on CT scan of the abdomen and pelvis - patient underwent biopsy of the lesion on 08/27/2017 by Dr.  Denman George - Recommendation is to obtain PET/CT scan but this will be scheduled as an outpatient - Dr. [place] Pink discussed with Dr. Lebron Conners, recommend follow-up with Dr. Alvy Bimler in an outpatient setting - I have discussed pathology report with Dr. Saralyn Pilar, preliminary report high suspicion for cervical, endometrial adenocarcinoma, poorly differentiated - I have discussed these findings with husband - I have spoke with GYN oncology oncall, treatment will likely be palliative in nature, will ask Dr. Denman George to discuss with family - per EGD report, duodenal ulcers possibly malignant as well, per Dr. Oletta Lamas, it is possible that primary malignancy is GI related, biopsy report is pending  - appreciate PCT input as well  - focus on comfort as noted above   Acute metabolic encephalopathy - due to septic shock, above-mentioned illnesses - still somnolent but NAD  Near syncope - Etiology not entirely clear, suspected dehydration - No recurrence since admission  Acute Left lower extremity DVT. - initially placed on Lovenox but due to worsening renal function, changed to heparin - anticoagulation discontinued due to acute blood loss anemia as noted above and ongoing bleed - no AC given EGD findings  - focus on comfort   Aspiration pneumonia (developed on 10/19 and pt started on Unasyn)  - on 10/25 developed septic shock, suspicion of new right middle lobe pneumonia - Antibiotics have been broadened to vancomycin and Fortaz as noted above, continued for 5 days  - now stopped in an effort to ensure comfort   Leukocytosis. - elevated since admission and has remained elevated despite initial use of ABX for aspiration PNA (started on Unasyn 10/19) - thought to be due to underlying malignancy as well  - ABX have been broadened as noted above to cover for new developing RML PNA 10/25 - has been on vanc and fortaz, 5 days - WBC has trended down but clinical status not better - focus on comfort    Hypocalcemia, hypokalemia, hypomagnesemia - phosph stable - no further blood work as noted above   History of nonischemic cardiomyopathy/chronic diastolic dysfunction. - echo results outlined below  Diabetes mellitus type 2. - no oral intake, no insulin needed   History of essential hypertension - antihypertensive regimen on hold  Normocytic anemia. Acute blood loss anemia imposed on chronic anemia  - has been transfused 1 unit of blood on 08/24/2017 - 2 units PRBC transfused 08/30/2017 - Hg has remained overall stable  PCM, inadequate oral nutrition  - nutritionist consulted  - hypoalbuminemia   DVT prophylaxis: IV heparin stopped, changed to SCD Code Status: full code Family Communication: update husband over the phone  Disposition Plan: residential hospice in AM  Consultants:   Medical oncology  GYN oncology  Nephrology - signed off 10/23  PCCM  Surgery   Pathologist Dr. Saralyn Pilar over the phone  GI  PCT  Procedures:   Left lower extremity venous Doppler, positive for acute DVT in the posterior tibial vein  Transthoracic echo EF 55-60% with grade 1 diastolic CHF  Antimicrobials:   Unasyn initiated on 08/23/2017, transition to oral Augmentin 08/28/2017 --> 10/24  Unasyn 10/24 --> 10/25  Vancomycin 10/25 -->  Fortaz 10/25 -->  Procedures/Studies: Dg Chest 2 View  Result Date: 08/13/2017 CLINICAL DATA:  Syncopal episode yesterday with fall, initial encounter EXAM: CHEST  2 VIEW COMPARISON:  07/05/2017  FINDINGS: Cardiac shadow is within normal limits. The lungs are well aerated bilaterally. No focal infiltrate or sizable effusion is seen. No acute bony abnormality is noted. Degenerative changes of the thoracic spine are seen. IMPRESSION: No active cardiopulmonary disease. Electronically Signed   By: Inez Catalina M.D.   On: 08/14/2017 10:45   Dg Abd 1 View  Result Date: 08/31/2017 CLINICAL DATA:  Nasogastric tube placement.  Initial  encounter. EXAM: ABDOMEN - 1 VIEW COMPARISON:  Abdominal radiograph performed 08/30/2017 FINDINGS: The patient's enteric tube is seen ending overlying the pylorus or first segment of the duodenum. The stomach is partially filled with air. No free intra-abdominal air is seen, though evaluation for free air is limited on a single supine view. No acute osseous abnormalities are identified. Flowing osteophytes are seen along the lower thoracic and upper lumbar spine. IMPRESSION: Enteric tube noted ending overlying the pylorus or first segment of the duodenum. Electronically Signed   By: Garald Balding M.D.   On: 08/31/2017 01:54   Ct Head Wo Contrast  Result Date: 08/22/2017 CLINICAL DATA:  Patient fell to the floor while using bedside commode. EXAM: CT HEAD WITHOUT CONTRAST CT CERVICAL SPINE WITHOUT CONTRAST TECHNIQUE: Multidetector CT imaging of the head and cervical spine was performed following the standard protocol without intravenous contrast. Multiplanar CT image reconstructions of the cervical spine were also generated. COMPARISON:  08/20/2017 FINDINGS: CT HEAD FINDINGS Brain: Diffuse cerebral atrophy. Ventricular dilatation consistent with central atrophy. Low-attenuation changes in the deep white matter consistent with small vessel ischemia. Encephalomalacia consistent with old infarct in the left occipital region. No mass effect or midline shift. No abnormal extra-axial fluid collections. Gray-white matter junctions are distinct. Basal cisterns are not effaced. No acute intracranial hemorrhage. Vascular: Prominent intracranial vascular calcifications. Skull: The calvarium appears intact. Sinuses/Orbits: Paranasal sinuses and mastoid air cells are not opacified. Other: No significant change since previous study. CT CERVICAL SPINE FINDINGS Alignment: Normal alignment of the cervical spine. Skull base and vertebrae: Skullbase appears intact. No vertebral compression deformities. No focal bone lesion or bone  destruction. 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 C1-2 articulation appears intact. Soft tissues and spinal canal: No prevertebral soft tissue swelling. No paraspinal soft tissue infiltration or mass. No visible canal hematoma. Disc levels: Diffuse degenerative changes throughout the cervical spine with bridging anterior osteophytes. Upper chest: Lung apices are clear. Other: None. IMPRESSION: 1. No acute intracranial abnormalities.  Old left occipital infarct. 2. Normal alignment of the cervical spine. Degenerative changes throughout with bridging anterior osteophytes. No acute displaced fractures identified. Electronically Signed   By: Lucienne Capers M.D.   On: 08/22/2017 04:03   Ct Head Wo Contrast  Result Date: 08/06/2017 CLINICAL DATA:  Fall. EXAM: CT HEAD WITHOUT CONTRAST CT CERVICAL SPINE WITHOUT CONTRAST TECHNIQUE: Multidetector CT imaging of the head and cervical spine was performed following the standard protocol without intravenous contrast. Multiplanar CT image reconstructions of the cervical spine were also generated. COMPARISON:  MRI brain dated July 20, 2010. FINDINGS: CT HEAD FINDINGS Brain: No evidence of acute infarction, hemorrhage, hydrocephalus, extra-axial collection or mass lesion/mass effect. 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 Mild age-related cerebral atrophy with compensatory dilatation of the ventricles, unchanged. Vascular: Intracranial atherosclerotic vascular calcifications. No hyperdense vessel. Skull: Normal. Negative for fracture or focal lesion. Sinuses/Orbits: The bilateral paranasal sinuses and mastoid air cells are clear. The orbits are unremarkable. Other: None. CT CERVICAL SPINE FINDINGS Alignment: Normal. Skull base and vertebrae: No acute fracture. Well corticated old fracture versus  congenital nonunion of the C1 left posterior arch. No primary bone lesion or focal pathologic process. Soft tissues and spinal canal: No prevertebral fluid or swelling. No  visible canal hematoma. Disc levels: Prominent anterior endplate flowing osteophytes, consistent with diffuse idiopathic skeletal hyperostosis. Disc spaces are relatively preserved. Upper chest: Negative. Other: None. IMPRESSION: 1. No acute intracranial abnormality. 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 2.  No acute cervical spine fracture.  Changes consistent with DISH. Electronically Signed   By: Titus Dubin M.D.   On: 08/08/2017 10:54   Ct Angio Chest Pe W Or Wo Contrast  Result Date: 08/23/2017 CLINICAL DATA:  Near syncopal episode, hypercalcemia, leukocytosis, DVT LEFT lower extremity, anemia, thrombocytosis, suspected colon cancer, acute post hemorrhagic anemia, the history diabetes mellitus, chronic diastolic CHF, non ischemic cardiomyopathy, hypertension EXAM: CT ANGIOGRAPHY CHEST CT ABDOMEN AND PELVIS WITH CONTRAST TECHNIQUE: Multidetector CT imaging of the chest was performed using the standard protocol during bolus administration of intravenous contrast. Multiplanar CT image reconstructions and MIPs were obtained to evaluate the vascular anatomy. Multidetector CT imaging of the abdomen and pelvis was performed using the standard protocol during bolus administration of intravenous contrast. CONTRAST:  100 cc Isovue 370 IV COMPARISON:  CTA chest 07/14/2010 FINDINGS: CTA CHEST FINDINGS Cardiovascular: No atherosclerotic calcifications aorta, proximal great vessels and coronary arteries. Aorta upper normal caliber without aneurysm or dissection. No pericardial effusion. Pulmonary arteries well opacified and patent. No evidence of pulmonary embolism. Mediastinum/Nodes: Tiny hiatal hernia. Questionable mild wall thickening of the esophagus diffusely. Base of cervical region unremarkable. No thoracic adenopathy. Lungs/Pleura: Patchy infiltrate in anterior segment RIGHT upper lobe adjacent to minor fissure. Respiratory motion artifacts at lower lungs. Several tiny nodular foci RIGHT lung up to 2 mm  diameter. Minimal dependent atelectasis in lower lobes. No additional infiltrate, pleural effusion or pneumothorax. Musculoskeletal: Osseous demineralization. Suspected ankylosis of the thoracic spine. Review of the MIP images confirms the above findings. CT ABDOMEN and PELVIS FINDINGS Hepatobiliary: Gallbladder contains numerous dependent calculi. Liver normal appearance. No biliary dilatation. Pancreas: Mildly atrophic, otherwise unremarkable Spleen: Unremarkable Adrenals/Urinary Tract: Adrenal thickening without discrete mass. Nodular focus of thickening and enhancement at the mid LEFT ureter cannot exclude tumor 4 mm diameter. Kidneys ureters and bladder otherwise normal appearance. Stomach/Bowel: Normal appendix. Stomach and bowel loops normal appearance Vascular/Lymphatic: Atherosclerotic calcifications aorta and iliac arteries without aneurysm. No adenopathy. Reproductive: Enlarged irregular and abnormal appearance with large areas of fluid/necrosis and nodular areas of nodularity/enhancement extending into cervix. Overall, the abnormal appearing nodular uterus measures 8.7 x 8.5 x 15.7 cm in size. Some gas is seen at the caudal aspect of the uterine mass near/at the cervix. Diffuse stranding of surrounding tissue planes in the pelvis with nodularity extending adjacent to the distal sigmoid colon, which is displaced to the RIGHT and appears thickened. Single small parametrial nodule on RIGHT 5 mm diameter image 60. Finding most likely either represents a large irregular cervical cancer or endometrial cancer. Other: Minimal fluid at the LEFT lateral conal fascia. No free air or additional free fluid. No definite distal peritoneal nodularity. Musculoskeletal: No acute osseous findings. Osseous demineralization with suspected ankylosis of the thoracolumbar spine. Review of the MIP images confirms the above findings. IMPRESSION: Enlarged abnormal appearing uterus with nodular areas of enhancement, areas of cystic  change/necrosis and internal gas most likely representing a neoplasm, question cervical cancer versus endometrial cancer. Diffuse stranding of surrounding fat planes cannot exclude tumor extension, with suspected small parametrial nodule on RIGHT 5 mm diameter, LEFT ureteral nodule/  metastasis 4 mm diameter, and involvement of the distal sigmoid colon. No distant metastases. No evidence of pulmonary embolism. Focus of RIGHT upper lobe infiltrate question pneumonia. Several tiny nonspecific RIGHT lung nodules. Cholelithiasis. Electronically Signed   By: Lavonia Dana M.D.   On: 08/23/2017 13:49   Ct Cervical Spine Wo Contrast  Result Date: 08/22/2017 CLINICAL DATA:  Patient fell to the floor while using bedside commode. EXAM: CT HEAD WITHOUT CONTRAST CT CERVICAL SPINE WITHOUT CONTRAST TECHNIQUE: Multidetector CT imaging of the head and cervical spine was performed following the standard protocol without intravenous contrast. Multiplanar CT image reconstructions of the cervical spine were also generated. COMPARISON:  08/11/2017 FINDINGS: CT HEAD FINDINGS Brain: Diffuse cerebral atrophy. Ventricular dilatation consistent with central atrophy. Low-attenuation changes in the deep white matter consistent with small vessel ischemia. Encephalomalacia consistent with old infarct in the left occipital region. No mass effect or midline shift. No abnormal extra-axial fluid collections. Gray-white matter junctions are distinct. Basal cisterns are not effaced. No acute intracranial hemorrhage. Vascular: Prominent intracranial vascular calcifications. Skull: The calvarium appears intact. Sinuses/Orbits: Paranasal sinuses and mastoid air cells are not opacified. Other: No significant change since previous study. CT CERVICAL SPINE FINDINGS Alignment: Normal alignment of the cervical spine. Skull base and vertebrae: Skullbase appears intact. No vertebral compression deformities. No focal bone lesion or bone destruction. Congenital  nonunion of the left lamina of C1. C1-2 articulation appears intact. Soft tissues and spinal canal: No prevertebral soft tissue swelling. No paraspinal soft tissue infiltration or mass. No visible canal hematoma. Disc levels: Diffuse degenerative changes throughout the cervical spine with bridging anterior osteophytes. Upper chest: Lung apices are clear. Other: None. IMPRESSION: 1. No acute intracranial abnormalities.  Old left occipital infarct. 2. Normal alignment of the cervical spine. Degenerative changes throughout with bridging anterior osteophytes. No acute displaced fractures identified. Electronically Signed   By: Lucienne Capers M.D.   On: 08/22/2017 04:03   Ct Cervical Spine Wo Contrast  Result Date: 08/30/2017 CLINICAL DATA:  Fall. EXAM: CT HEAD WITHOUT CONTRAST CT CERVICAL SPINE WITHOUT CONTRAST TECHNIQUE: Multidetector CT imaging of the head and cervical spine was performed following the standard protocol without intravenous contrast. Multiplanar CT image reconstructions of the cervical spine were also generated. COMPARISON:  MRI brain dated July 20, 2010. FINDINGS: CT HEAD FINDINGS Brain: No evidence of acute infarction, hemorrhage, hydrocephalus, extra-axial collection or mass lesion/mass effect. 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 Mild age-related cerebral atrophy with compensatory dilatation of the ventricles, unchanged. Vascular: Intracranial atherosclerotic vascular calcifications. No hyperdense vessel. Skull: Normal. Negative for fracture or focal lesion. Sinuses/Orbits: The bilateral paranasal sinuses and mastoid air cells are clear. The orbits are unremarkable. Other: None. CT CERVICAL SPINE FINDINGS Alignment: Normal. Skull base and vertebrae: No acute fracture. 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 No primary bone lesion or focal pathologic process. Soft tissues and spinal canal: No prevertebral fluid or swelling. No visible canal  hematoma. Disc levels: Prominent anterior endplate flowing osteophytes, consistent with diffuse idiopathic skeletal hyperostosis. Disc spaces are relatively preserved. Upper chest: Negative. Other: None. IMPRESSION: 1. No acute intracranial abnormality. 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 2.  No acute cervical spine fracture.  Changes consistent with DISH. Electronically Signed   By: Titus Dubin M.D.   On: 08/19/2017 10:54   Ct Abdomen Pelvis W Contrast  Result Date: 08/23/2017 CLINICAL DATA:  Near syncopal episode, hypercalcemia, leukocytosis, DVT LEFT lower extremity, anemia, thrombocytosis, suspected colon cancer, acute post hemorrhagic anemia, the  history diabetes mellitus, chronic diastolic CHF, non ischemic cardiomyopathy, hypertension EXAM: CT ANGIOGRAPHY CHEST CT ABDOMEN AND PELVIS WITH CONTRAST TECHNIQUE: Multidetector CT imaging of the chest was performed using the standard protocol during bolus administration of intravenous contrast. Multiplanar CT image reconstructions and MIPs were obtained to evaluate the vascular anatomy. Multidetector CT imaging of the abdomen and pelvis was performed using the standard protocol during bolus administration of intravenous contrast. CONTRAST:  100 cc Isovue 370 IV COMPARISON:  CTA chest 07/14/2010 FINDINGS: CTA CHEST FINDINGS Cardiovascular: No atherosclerotic calcifications aorta, proximal great vessels and coronary arteries. Aorta upper normal caliber without aneurysm or dissection. No pericardial effusion. Pulmonary arteries well opacified and patent. No evidence of pulmonary embolism. Mediastinum/Nodes: Tiny hiatal hernia. Questionable mild wall thickening of the esophagus diffusely. Base of cervical region unremarkable. No thoracic adenopathy. Lungs/Pleura: Patchy infiltrate in anterior segment RIGHT upper lobe adjacent to minor fissure. Respiratory motion artifacts at lower lungs. Several tiny nodular foci RIGHT lung up to 2 mm diameter. Minimal  dependent atelectasis in lower lobes. No additional infiltrate, pleural effusion or pneumothorax. Musculoskeletal: Osseous demineralization. Suspected ankylosis of the thoracic spine. Review of the MIP images confirms the above findings. CT ABDOMEN and PELVIS FINDINGS Hepatobiliary: Gallbladder contains numerous dependent calculi. Liver normal appearance. No biliary dilatation. Pancreas: Mildly atrophic, otherwise unremarkable Spleen: Unremarkable Adrenals/Urinary Tract: Adrenal thickening without discrete mass. Nodular focus of thickening and enhancement at the mid LEFT ureter cannot exclude tumor 4 mm diameter. Kidneys ureters and bladder otherwise normal appearance. Stomach/Bowel: Normal appendix. Stomach and bowel loops normal appearance Vascular/Lymphatic: Atherosclerotic calcifications aorta and iliac arteries without aneurysm. No adenopathy. Reproductive: Enlarged irregular and abnormal appearance with large areas of fluid/necrosis and nodular areas of nodularity/enhancement extending into cervix. Overall, the abnormal appearing nodular uterus measures 8.7 x 8.5 x 15.7 cm in size. Some gas is seen at the caudal aspect of the uterine mass near/at the cervix. Diffuse stranding of surrounding tissue planes in the pelvis with nodularity extending adjacent to the distal sigmoid colon, which is displaced to the RIGHT and appears thickened. Single small parametrial nodule on RIGHT 5 mm diameter image 60. Finding most likely either represents a large irregular cervical cancer or endometrial cancer. Other: Minimal fluid at the LEFT lateral conal fascia. No free air or additional free fluid. No definite distal peritoneal nodularity. Musculoskeletal: No acute osseous findings. Osseous demineralization with suspected ankylosis of the thoracolumbar spine. Review of the MIP images confirms the above findings. IMPRESSION: Enlarged abnormal appearing uterus with nodular areas of enhancement, areas of cystic change/necrosis  and internal gas most likely representing a neoplasm, question cervical cancer versus endometrial cancer. Diffuse stranding of surrounding fat planes cannot exclude tumor extension, with suspected small parametrial nodule on RIGHT 5 mm diameter, LEFT ureteral nodule/ metastasis 4 mm diameter, and involvement of the distal sigmoid colon. No distant metastases. No evidence of pulmonary embolism. Focus of RIGHT upper lobe infiltrate question pneumonia. Several tiny nonspecific RIGHT lung nodules. Cholelithiasis. Electronically Signed   By: Lavonia Dana M.D.   On: 08/23/2017 13:49   US Renal  Result Date: 08/30/2017 CLINICAL DATA:  Oliguria for today. EXAM: RENAL / URINARY TRACT ULTRASOUND COMPLETE COMPARISON:  None. FINDINGS: Right Kidney: Length: 9.4 cm. Echogenicity within normal limits. There is cortical thinning. No mass or hydronephrosis visualized. Left Kidney: Length: 10.4 cm. Echogenicity within normal limits. There is cortical thinning. No mass or hydronephrosis visualized. Bladder: The bladder is decompressed with question Foley catheter in place. Trace ascites and right pleural effusion  are noted. There are gallstones and sludge in the gallbladder. There is a complex masslike lesion in the midline to left pelvis. IMPRESSION: Bilateral renal cortical thinning. No acute abnormalities identified in the kidney. Gallstones and sludge within the gallbladder. There is a question complex masslike lesion in the midline to left pelvis. Trace ascites and right pleural effusion noted. Electronically Signed   By: Abelardo Diesel M.D.   On: 08/30/2017 21:41   US Renal  Result Date: 08/25/2017 CLINICAL DATA:  Acute renal failure EXAM: RENAL / URINARY TRACT ULTRASOUND COMPLETE COMPARISON:  04/07/2013 FINDINGS: Right Kidney: Length: 9.8 cm.  No hydronephrosis.  Normal renal echogenicity. Left Kidney: Length: 11.0 cm.  No hydronephrosis.  Normal renal echogenicity. Bladder: Decompressed around a Foley catheter.  IMPRESSION: Normal renal ultrasound for age. Electronically Signed   By: Abigail Miyamoto M.D.   On: 08/25/2017 17:44   Dg Chest Port 1 View  Result Date: 08/30/2017 CLINICAL DATA:  Cough, recent small bowel obstruction, diabetes EXAM: PORTABLE CHEST 1 VIEW COMPARISON:  CT chest of 08/23/2017 and chest x-ray of 09/04/2017 FINDINGS: There is peripheral opacity in the right mid lung suspicious for a patchy area of pneumonia. The left lung is clear. A tiny right pleural effusion cannot be excluded. Mediastinal and hilar contours are unremarkable and the heart remains within upper limits of normal. NG tube extends below the hemidiaphragm. IMPRESSION: 1. Peripheral opacity in the right mid lung may represent a small focus of pneumonia. 2. Cannot exclude a tiny right pleural effusion. Electronically Signed   By: Ivar Drape M.D.   On: 08/30/2017 15:54   Dg Abd Portable 1v  Result Date: 08/31/2017 CLINICAL DATA:  Gastric distention . EXAM: PORTABLE ABDOMEN - 1 VIEW COMPARISON:  08/31/2017 . FINDINGS: Interim removal of NG tube. Mild gastric distention noted. No evidence of small bowel distention. No free air. Vascular calcification. Degenerative changes lumbar spine and both hips. IMPRESSION: Interim removal of NG tube.  Mild gastric distention noted. Electronically Signed   By: Marcello Moores  Register   On: 08/31/2017 06:46   Dg Abd Portable 1v  Result Date: 08/30/2017 CLINICAL DATA:  76 year old female with sudden onset nausea vomiting. EXAM: PORTABLE ABDOMEN - 1 VIEW COMPARISON:  Abdominal CT dated 08/23/2017 and ultrasound dated 08/25/2017 FINDINGS: There is air distention of the stomach with relative paucity of the small bowel gas. Findings may be related to dysmotility of the stomach or a degree of gastric outlet obstruction. Clinical correlation and follow-up recommended. There is no date of the small bowel. Multiple pockets of air noted in the right colon. A pocket of air and soft tissue density over the pelvis  likely corresponds to the necrotic mass seen on the prior CT. No free air identified on the provided image. There is osteopenia with degenerative changes of the spine and hips. No acute osseous pathology. IMPRESSION: 1. Moderate air distention of the stomach with paucity of small bowel gas may represent gastric dysmotility versus less likely gastric outlet obstruction. 2. No dilatation of the small bowel or evidence of small-bowel obstruction. 3. Soft tissue opacity with pocket of air in the pelvis corresponding to the necrotic mass seen on the prior CT. Electronically Signed   By: Anner Crete M.D.   On: 08/30/2017 01:24     Discharge Exam: Vitals:   09/04/17 0900 09/04/17 1000  BP:    Pulse:    Resp: 20 20  Temp: 99.5 F (37.5 C)   SpO2:     Vitals:  09/04/17 0700 09/04/17 0800 09/04/17 0900 09/04/17 1000  BP:      Pulse: (!) 106 (!) 102    Resp: (!) 21 (!) 21 20 20   Temp: 99.3 F (37.4 C) 99.3 F (37.4 C) 99.5 F (37.5 C)   TempSrc:      SpO2: 97% 99%    Weight:      Height:        General: Pt is somnolent, NAD Cardiovascular: tachycardic  Respiratory: shallow respirations, rhonchi bilaterally  Abdominal: Soft, non tender, non distended, bowel sounds + Extremities: no edema, no cyanosis  Discharge Instructions   Allergies as of 09/04/2017      Reactions   Erythromycin Nausea And Vomiting   Quinapril Other (See Comments)   unknown   Ciprofloxacin Hcl Nausea Only      Medication List    STOP taking these medications   alendronate 70 MG tablet Commonly known as:  FOSAMAX   aspirin 81 MG tablet   atorvastatin 20 MG tablet Commonly known as:  LIPITOR   carvedilol 12.5 MG tablet Commonly known as:  COREG   cholecalciferol 1000 units tablet Commonly known as:  VITAMIN D   hydrochlorothiazide 12.5 MG capsule Commonly known as:  MICROZIDE   linagliptin 5 MG Tabs tablet Commonly known as:  TRADJENTA   Omega 3 1200 MG Caps   ramipril 10 MG  tablet Commonly known as:  ALTACE   VITA-C PO     TAKE these medications   fluticasone 50 MCG/ACT nasal spray Commonly known as:  FLONASE Place 1-2 sprays into both nostrils daily as needed for allergies.   morphine CONCENTRATE 10 MG/0.5ML Soln concentrated solution Place 0.25 mLs (5 mg total) under the tongue every hour as needed for moderate pain, severe pain or shortness of breath.       Contact information for follow-up providers    Everitt Amber, MD Follow up on 08/27/2017.   Specialty:  Obstetrics and Gynecology Why:  at 9:30 am at the [facility].   Contact information: [place] [address] [phone]            Contact information for after-discharge care    Destination    [facility] .   Specialty:  [place] information: [address] [place] [phone]                  The results of significant diagnostics from this hospitalization (including imaging, microbiology, ancillary and laboratory) are listed below for reference.    Microbiology: Recent Results (from the past 240 hour(s))  Culture, Urine     Status: None   Collection Time: 08/26/17  6:44 PM  Result Value Ref Range Status   Specimen Description URINE, RANDOM  Final   Special Requests NONE  Final   Culture   Final    NO GROWTH Performed at [facility], [address], [place], [place] [address]    Report Status 08/28/2017 FINAL  Final  MRSA PCR Screening     Status: None   Collection Time: 08/30/17  8:07 AM  Result Value Ref Range Status   MRSA by PCR NEGATIVE NEGATIVE Final    Comment:        The GeneXpert MRSA Assay (FDA approved for NASAL specimens only), is one component of a comprehensive MRSA colonization surveillance program. It is not intended to diagnose MRSA infection nor to guide or monitor treatment for MRSA infections.  Culture, Urine     Status: None    Collection Time: 08/30/17  3:48 PM  Result Value Ref Range Status   Specimen Description URINE, CATHETERIZED  Final   Special Requests Normal  Final   Culture   Final    NO GROWTH Performed at [facility], [address] [place], [place] [address]    Report Status 08/31/2017 FINAL  Final  Culture, blood (routine x 2)     Status: None (Preliminary result)   Collection Time: 08/30/17  4:07 PM  Result Value Ref Range Status   Specimen Description BLOOD RIGHT ARM  Final   Special Requests IN PEDIATRIC BOTTLE Blood Culture adequate volume  Final   Culture   Final    NO GROWTH 4 DAYS Performed at [facility], [place] [address] [place], [place] [address]    Report Status PENDING  Incomplete  Culture, blood (routine x 2)     Status: None (Preliminary result)   Collection Time: 08/30/17  4:19 PM  Result Value Ref Range Status   Specimen Description BLOOD RIGHT HAND  Final   Special Requests IN PEDIATRIC BOTTLE Blood Culture adequate volume  Final   Culture   Final    NO GROWTH 4 DAYS Performed at [facility], [place] [address], [place], [place] [address]    Report Status PENDING  Incomplete     Labs: Basic Metabolic Panel:  Recent Labs Lab 08/30/17 0425 08/31/17 0331 09/01/17 0336 08/18/2017 0342 09/03/17 0355  NA 139 143 139 139 138  K 3.5 3.0* 3.7 3.1* 2.6*  CL 94* 96* 96* 96* 96*  CO2 24 32 28 29 29   GLUCOSE 167* 104* 140* 113* 98  BUN 36* 44* 39* 29* 20  CREATININE 2.16* 2.87*  2.84* 2.12*  2.10* 1.62*  1.54* 1.21*  1.23*  CALCIUM 7.0* 6.3* 7.2* 7.2* 7.3*  MG  --  1.0* 1.8 1.3* 1.6*  PHOS  --  4.5 3.1 2.9 2.8   Liver Function Tests:  Recent Labs Lab 08/31/17 0331 09/01/17 0336 09/05/2017 0342 09/03/17 0355  ALBUMIN 1.7* 1.7* 1.7* 1.6*   CBC:  Recent Labs Lab 08/30/17 0425  08/31/17 0331  09/01/17 0336  08/25/2017 0342 09/03/2017 0609 08/28/2017 0758 08/31/2017 1138 09/03/17 0355  WBC 35.8*  --  44.2*  --  35.9*  --  18.6*  --   --   --   17.4*  NEUTROABS  --   --  39.4*  --  33.0*  --  17.0*  --   --   --  15.7*  HGB 6.3*  < > 8.8*  8.8*  < > 8.7*  < > 8.9* 8.1* 8.3* 8.1* 8.8*  HCT 18.7*  < > 24.5*  24.6*  < > 24.9*  < > 26.3* 23.9* 24.0* 23.3* 26.5*  MCV 87.0  --  84.5  --  86.8  --  88.6  --   --   --  89.5  PLT 566*  --  380  --  302  --  251  --   --   --  234  < > = values in this interval not displayed.  CBG:  Recent Labs Lab 08/28/2017 2320 09/03/17 0343 09/03/17 0721 09/03/17 1127 09/03/17 1534  GLUCAP 92 88 74 79 77   SIGNED: Time coordinating discharge: 60 minutes  Iskra Magick-Myers, MD  [facility] 09/04/2017, 10:16 AM Pager [phone]  If 7PM-7AM, please contact night-coverage [email or web address] Password [email or web address]

## 2017-09-04 NOTE — Discharge Instructions (Signed)
Acute Kidney Injury, Adult Acute kidney injury is a sudden worsening of kidney function. The kidneys are organs that have several jobs. They filter the blood to remove waste products and extra fluid. They also maintain a healthy balance of minerals and hormones in the body, which helps control blood pressure and keep bones strong. With this condition, your kidneys do not do their jobs as well as they should. This condition ranges from mild to severe. Over time it may develop into long-lasting (chronic) kidney disease. Early detection and treatment may prevent acute kidney injury from developing into a chronic condition. What are the causes? Common causes of this condition include:  A problem with blood flow to the kidneys. This may be caused by:  Low blood pressure (hypotension) or shock.  Blood loss.  Heart and blood vessel (cardiovascular) disease.  Severe burns.  Liver disease.  Direct damage to the kidneys. This may be caused by:  Certain medicines.  A kidney infection.  Poisoning.  Being around or in contact with toxic substances.  A surgical wound.  A hard, direct hit to the kidney area.  A sudden blockage of urine flow. This may be caused by:  Cancer.  Kidney stones.  An enlarged prostate in males. What are the signs or symptoms? Symptoms of this condition may not be obvious until the condition becomes severe. Symptoms of this condition can include:  Tiredness (lethargy), or difficulty staying awake.  Nausea or vomiting.  Swelling (edema) of the face, legs, ankles, or feet.  Problems with urination, such as:  Abdominal pain, or pain along the side of your stomach (flank).  Decreased urine production.  Decrease in the force of urine flow.  Muscle twitches and cramps, especially in the legs.  Confusion or trouble concentrating.  Loss of appetite.  Fever. How is this diagnosed? This condition may be diagnosed with tests, including:  Blood  tests.  Urine tests.  Imaging tests.  A test in which a sample of tissue is removed from the kidneys to be examined under a microscope (kidney biopsy). How is this treated? Treatment for this condition depends on the cause and how severe the condition is. In mild cases, treatment may not be needed. The kidneys may heal on their own. In more severe cases, treatment will involve:  Treating the cause of the kidney injury. This may involve changing any medicines you are taking or adjusting your dosage.  Fluids. You may need specialized IV fluids to balance your body's needs.  Having a catheter placed to drain urine and prevent blockages.  Preventing problems from occurring. This may mean avoiding certain medicines or procedures that can cause further injury to the kidneys. In some cases treatment may also require:  A procedure to remove toxic wastes from the body (dialysis or continuous renal replacement therapy - CRRT).  Surgery. This may be done to repair a torn kidney, or to remove the blockage from the urinary system. Follow these instructions at home: Medicines   Take over-the-counter and prescription medicines only as told by your health care provider.  Do not take any new medicines without your health care provider's approval. Many medicines can worsen your kidney damage.  Do not take any vitamin and mineral supplements without your health care provider's approval. Many nutritional supplements can worsen your kidney damage. Lifestyle   If your health care provider prescribed changes to your diet, follow them. You may need to decrease the amount of protein you eat.  Achieve and maintain a   healthy weight. If you need help with this, ask your health care provider.  Start or continue an exercise plan. Try to exercise at least 30 minutes a day, 5 days a week.  Do not use any tobacco products, such as cigarettes, chewing tobacco, and e-cigarettes. If you need help quitting, ask  your health care provider. General instructions   Keep track of your blood pressure. Report changes in your blood pressure as told by your health care provider.  Stay up to date with immunizations. Ask your health care provider which immunizations you need.  Keep all follow-up visits as told by your health care provider. This is important. Where to find more information:  American Association of Kidney Patients: www.aakp.org  National Kidney Foundation: www.kidney.org  American Kidney Fund: www.akfinc.org  Life Options Rehabilitation Program:  www.lifeoptions.org  www.kidneyschool.org Contact a health care provider if:  Your symptoms get worse.  You develop new symptoms. Get help right away if:  You develop symptoms of worsening kidney disease, which include:  Headaches.  Abnormally dark or light skin.  Easy bruising.  Frequent hiccups.  Chest pain.  Shortness of breath.  End of menstruation in women.  Seizures.  Confusion or altered mental status.  Abdominal or back pain.  Itchiness.  You have a fever.  Your body is producing less urine.  You have pain or bleeding when you urinate. Summary  Acute kidney injury is a sudden worsening of kidney function.  Acute kidney injury can be caused by problems with blood flow to the kidneys, direct damage to the kidneys, and sudden blockage of urine flow.  Symptoms of this condition may not be obvious until it becomes severe. Symptoms may include edema, lethargy, confusion, nausea or vomiting, and problems passing urine.  This condition can usually be diagnosed with blood tests, urine tests, and imaging tests. Sometimes a kidney biopsy is done to diagnose this condition.  Treatment for this condition often involves treating the underlying cause. It is treated with fluids, medicines, dialysis, diet changes, or surgery. This information is not intended to replace advice given to you by your health care provider.  Make sure you discuss any questions you have with your health care provider. Document Released: 05/08/2011 Document Revised: 10/13/2016 Document Reviewed: 10/13/2016 Elsevier Interactive Patient Education  2017 Elsevier Inc.  

## 2017-09-04 NOTE — Progress Notes (Signed)
Kimberly Castaneda 10:14 AM  Subjective: Patient doing well without any new complaints and no signs of bleeding and her case discussed with my partner Dr. Oletta Lamas and Dr. 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

## 2017-09-04 NOTE — Consult Note (Signed)
[facility] [employer]  Received request from [place] this morning for family interest in [facility]. Unfortunately [facility] does not have room to offer this morning. Will continue to follow, update CSW and family re availability.   Thank you, Erling Conte, LCSW [phone]

## 2017-09-05 DIAGNOSIS — R339 Retention of urine, unspecified: Secondary | ICD-10-CM

## 2017-09-05 DIAGNOSIS — K3189 Other diseases of stomach and duodenum: Secondary | ICD-10-CM

## 2017-09-05 NOTE — Progress Notes (Signed)
Patient seen and examined. Comfortable and in no distress. No verbal responses, but able to nod yes or no. Currently denying any pain. Plan is for full comfort care and with plan to discharge to residential hospice facility when bed available. Discharge summary done on 10/30 and reviewed by myself today. No changes needed. Will follow up with social work regarding bed availability. If needed patient can be transferred to med-surg bed. Currently avoiding extra movements to minimize distress, but family updated and aware.   Barton Dubois MD [phone]

## 2017-09-05 NOTE — Progress Notes (Signed)
[facility] Liaison  Continue to follow for family interest in [facility]. Spoke with Mr. Faye yesterday afternoon to make him aware [facility] is not able to offer a room at this time. He is aware we will continue to follow until disposition is determined. Made CSW Jarrett Soho aware this morning that room is not available today.   Thank you,  Erling Conte, LCSW [phone]

## 2017-09-05 NOTE — Progress Notes (Signed)
Patient transferred to [address]. Husband called, and no answer. Daughter called, and aware.

## 2017-09-05 NOTE — Progress Notes (Signed)
LCSW continues to follow for residential hospice placement  [place] reports there are still not available bed for [facility] today. Will discuss with team and husband alternative options such as [facility] if amendable. Family is aware there are no available beds as well and barrier to transition to hospice.  Will follow up and continue to support family and disposition.  Lane Hacker, MSW Clinical Social Work: [employer] Coverage for :  [phone]

## 2017-09-06 DIAGNOSIS — C541 Malignant neoplasm of endometrium: Secondary | ICD-10-CM

## 2017-09-06 DIAGNOSIS — K922 Gastrointestinal hemorrhage, unspecified: Secondary | ICD-10-CM

## 2017-09-06 MED ORDER — LIP MEDEX EX OINT
TOPICAL_OINTMENT | CUTANEOUS | Status: AC
Start: 2017-09-06 — End: 2017-09-06
  Administered 2017-09-06: 11:00:00
  Filled 2017-09-06: qty 7

## 2017-09-06 DEATH — deceased

## 2017-09-09 DIAGNOSIS — C541 Malignant neoplasm of endometrium: Secondary | ICD-10-CM

## 2017-09-09 DIAGNOSIS — Z515 Encounter for palliative care: Secondary | ICD-10-CM

## 2017-09-09 DIAGNOSIS — K922 Gastrointestinal hemorrhage, unspecified: Secondary | ICD-10-CM

## 2017-09-17 ENCOUNTER — Ambulatory Visit: Payer: Medicare Other | Admitting: Gynecologic Oncology

## 2017-10-06 NOTE — Progress Notes (Signed)
RN  Called to room by nurse tech. No respirations visible. This RN assessed for heart sounds, none present. Also no carotid or radial pulse present. Death verified by Butler Denmark, RN. Patient time of death was 37.

## 2017-10-06 NOTE — Progress Notes (Signed)
[facility] notified. Patient not a canidate for organ donation. This RN spoke with Dennard Nip with [facility]. Referral number [id number].

## 2017-10-06 NOTE — Progress Notes (Signed)
[place] with Mr. Lasky this morning to advise that there are currently no beds to offer from [facility] at this time.  Jarrett Soho, CSW, was also advised of same.  We will continue to follow and update family and CSW if there is room availability.    Thank you for this referral.  Edyth Gunnels, RN, [facility] Liaison [phone]  All hospital liaisons are now on [place].

## 2017-10-06 NOTE — Discharge Summary (Signed)
Death Summary  Kimberly Castaneda OIN:[id number] DOB: September 08, 1941 DOA: 2017-09-12  PCP: [name], MD PCP/Office notified: through EPIC  Admit date: 12-Sep-2017 Date of Death: 09-28-17  Final Diagnoses:  Active Problems:   CKD (chronic kidney disease) stage 3, GFR 30-59 ml/min (HCC)   Non-insulin dependent type 2 diabetes mellitus (HCC)   Dyslipidemia   Chronic diastolic CHF (congestive heart failure), NYHA class 1 (HCC)   Syncope, vasovagal   Hypercalcemia   Leukocytosis   Anemia   Pressure injury of skin   Cervical mass   Palliative care by specialist   DNR (do not resuscitate)    History of present illness:  As per H&P written by Dr. Candiss Norse written on 09-12-17 Patient is 76 year old female with known and chronic diastolic CHF, nonischemic cardiomyopathy, hypertension, chronic leukocytosis, presented after an episode of near syncope. Patient was brought into the hospital day after syncopal event and was found to have hypercalcemia, leukocytosis, DVT in the left lower extremity. Patient was seen by oncology, underwent CT scan of the chest abdomen and pelvis which raises concern for uterine mass. Patient subsequently developed acute renal failure due to contrast.   Hospital Course:  Nausea, vomiting & blood in stool. -patient with recent findings suggesting uterine/cervical mass            -GOC discussion directed for comfort care and symptomatic management   -patient was not eating and became less interactive  -while waiting for hospice bed, she passed away at 9:50am on 2017/09/28  Hypotension, hypovolemic shock and RML PNA -treated with abx's -shock felt to be a combination of acute blood loss and infection  -2 units of PRBC's transfused -despite intervention patient continue declining and following [place] meeting and decisions kept on comfort measures only; no further blood draws or transfusions given -patient expired on 2017-09-28 at 9:50 am  Uterine/cervical mass: with  concerns for malignancy - this was seen on CT scan of the abdomen and pelvis - patient underwent biopsy of the lesion on 08/27/2017 by Dr. Denman George - Recommendation is to obtain PET/CT scan but this will be scheduled as an outpatient - Dr. Maryland Pink discussed with Dr. Lebron Conners, recommend follow-up with Dr. Alvy Bimler in an outpatient setting - Dr. Doyle Askew discussed pathology report with Dr. Saralyn Pilar, preliminary report high suspicion for cervical, endometrial adenocarcinoma, poorly differentiated -per Dr. Oletta Lamas appearance on her GI ulcers most likely malignant as well. - discussed these findings with husband - Dr. Doyle Askew spoke with GYN oncology oncall, treatment will likely be palliative in nature only -per [place] decisions, no further treatment or intervention wanted; plan was to focus on full comfort care and symptomatic management    *Patient was transition to full comfort care and was waiting for hospice bed at [facility]. On 2017-09-28 at 9:50 am she passed away. All medications for the rest of her medical problems were discontinued and care was focus on comfort and symptoms management. Family came to bedside and were satisfied with interventions and care provided.                           Signed:  Barton Dubois  [facility] 2017/09/28, 12:37 PM

## 2017-10-06 DEATH — deceased

## 2017-10-08 ENCOUNTER — Ambulatory Visit: Payer: Medicare Other | Admitting: Cardiovascular Disease

## 2018-11-20 IMAGING — CT CT HEAD W/O CM
3 of 8 series · 15 of 47 positions shown, 18 images · non-contrast
Comparison: MRI brain dated July 20, 2010.

CLINICAL DATA: Fall.

EXAM:
CT HEAD WITHOUT CONTRAST
CT CERVICAL SPINE WITHOUT CONTRAST
TECHNIQUE: Multidetector CT imaging of the head and cervical spine was
performed following the standard protocol without intravenous
contrast. Multiplanar CT image reconstructions of the cervical spine
were also generated.

[Series 5: coronal · coronal · 0.37mm/px · 3 of 60 slices shown]
[im 17/60  brain]
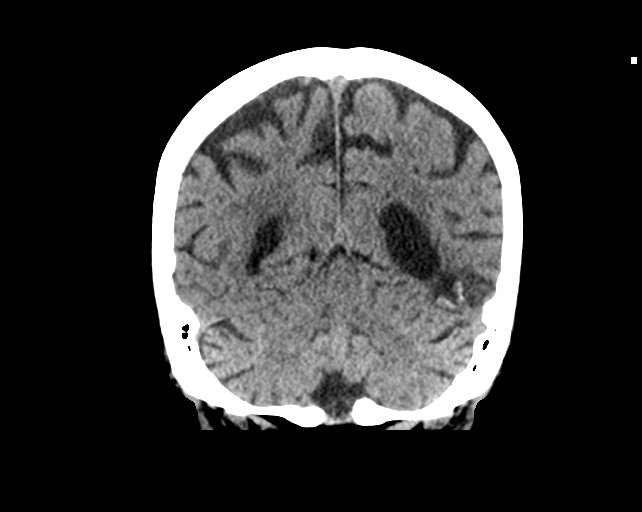
[im 26/60  brain]
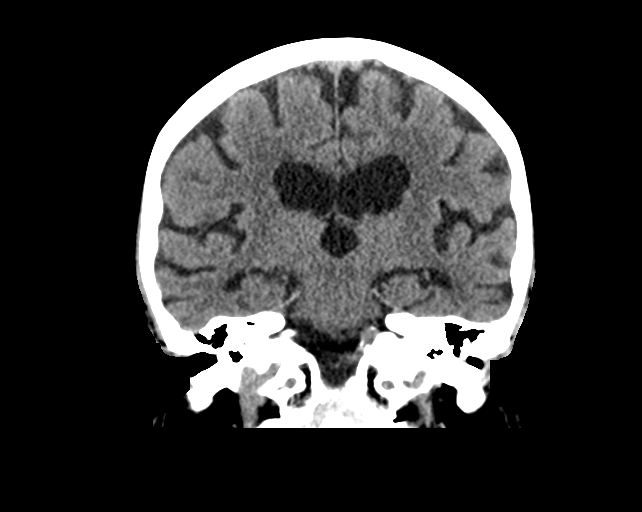
[im 34/60  brain]
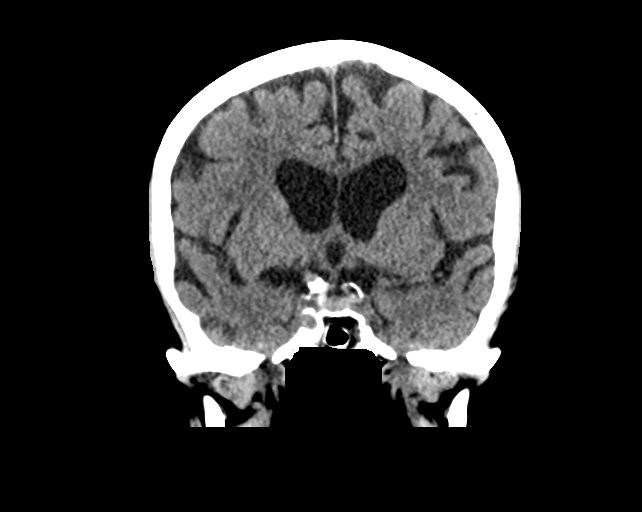

[Series 10: axial recon · axial · 0.23mm/px · z∈[-288,-133]mm · 10 of 113 slices shown, 13 images]
[im 11/113  brain]
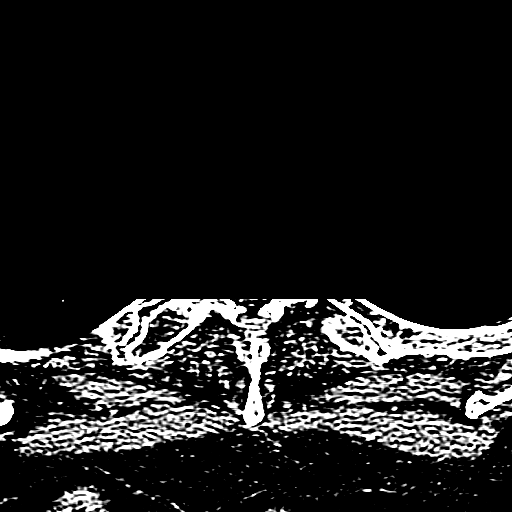
[im 11/113  bone]
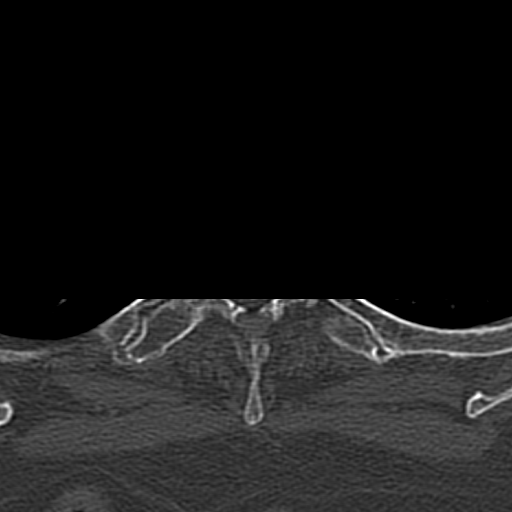
[im 21/113  brain]
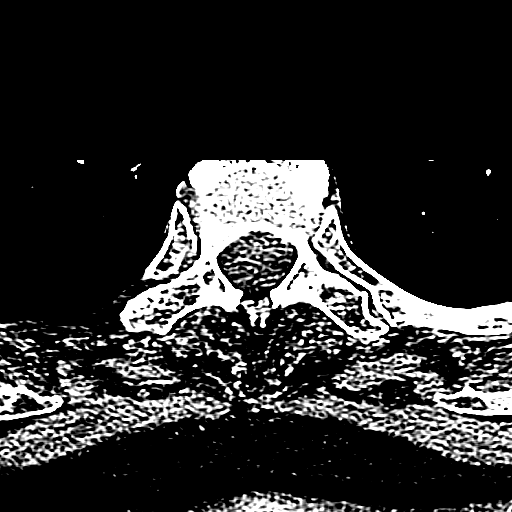
[im 31/113  brain]
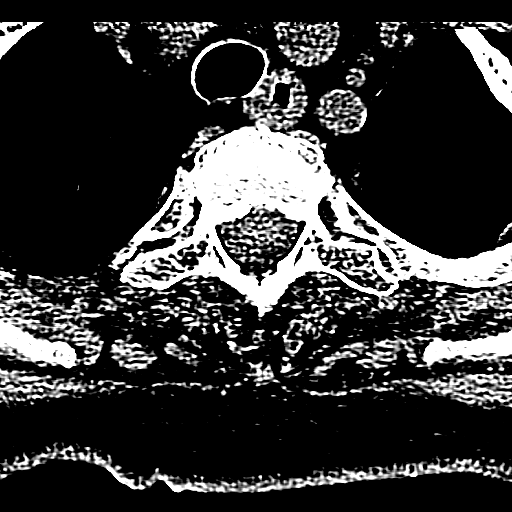
[im 41/113  brain]
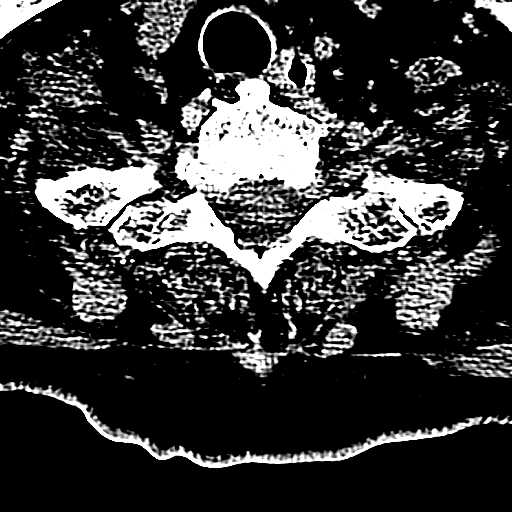
[im 51/113  brain]
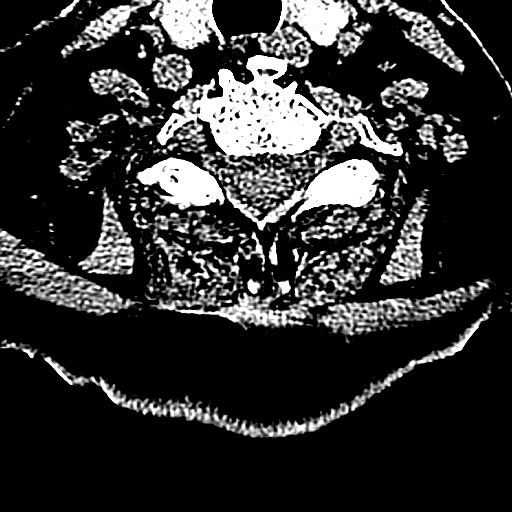
[im 51/113  bone]
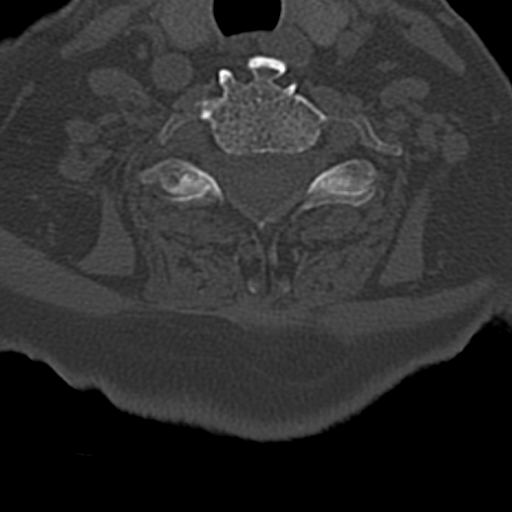
[im 62/113  brain]
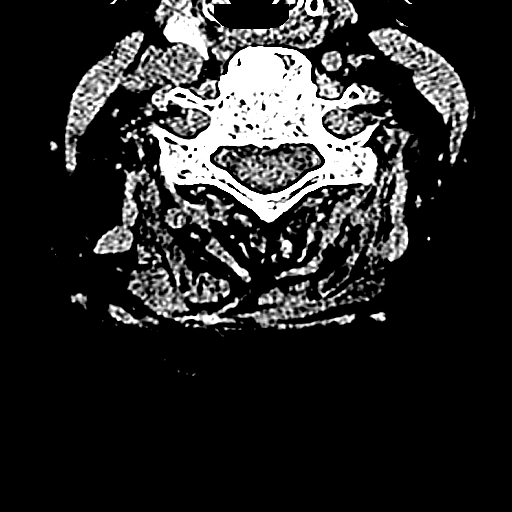
[im 72/113  brain]
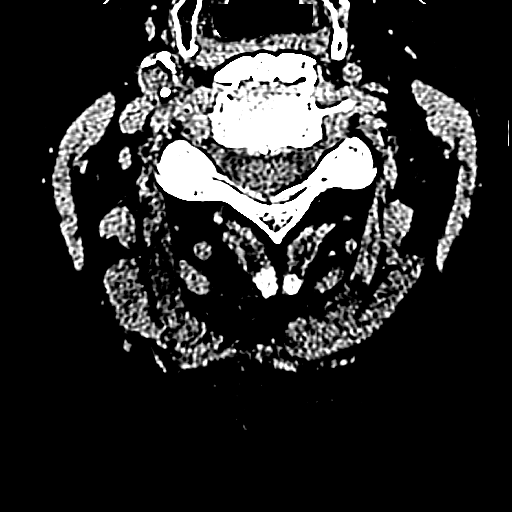
[im 82/113  brain]
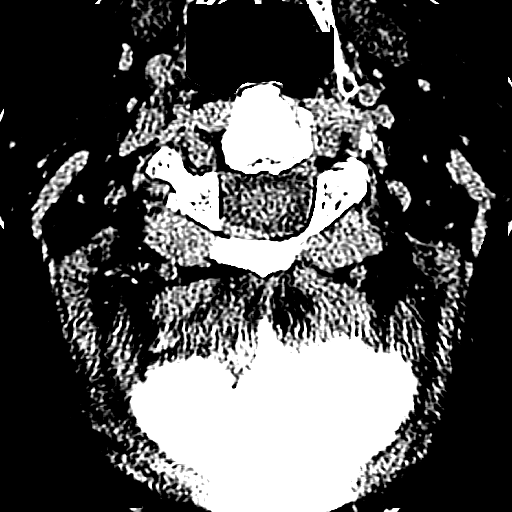
[im 92/113  brain]
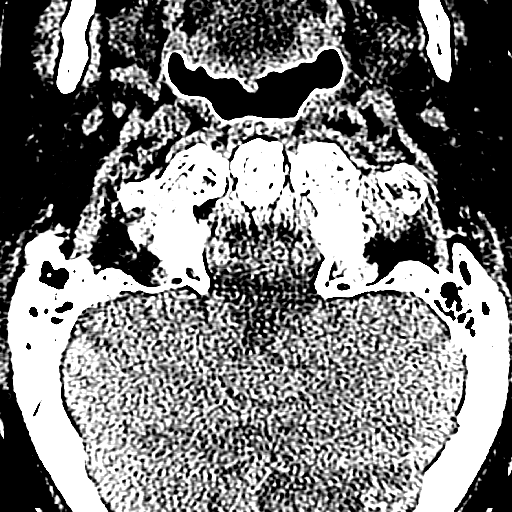
[im 92/113  bone]
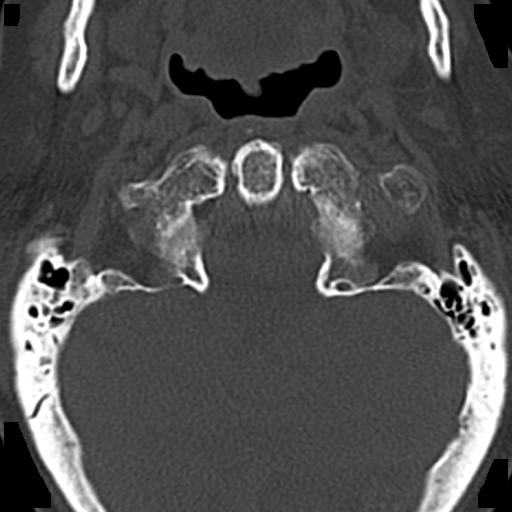
[im 102/113  brain]
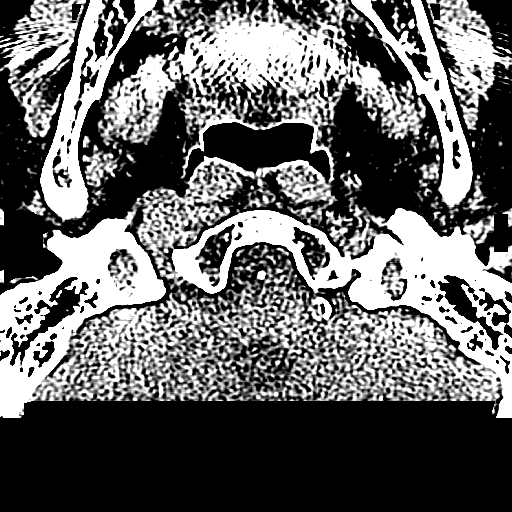

[Series 12: sagittal · sagittal · 0.38mm/px · 2 of 61 slices shown]
[im 21/61  brain]
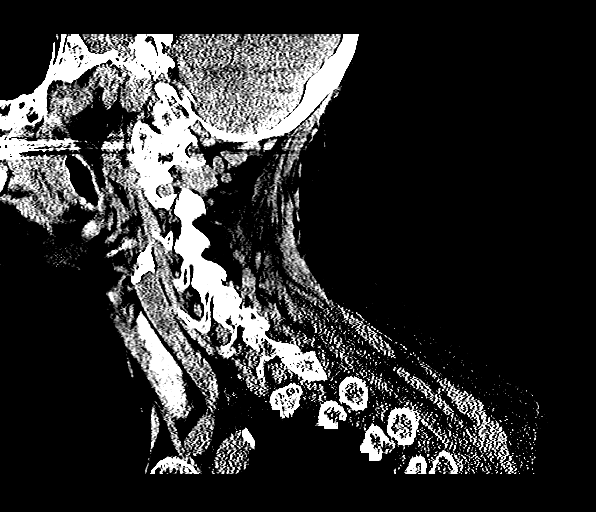
[im 41/61  brain]
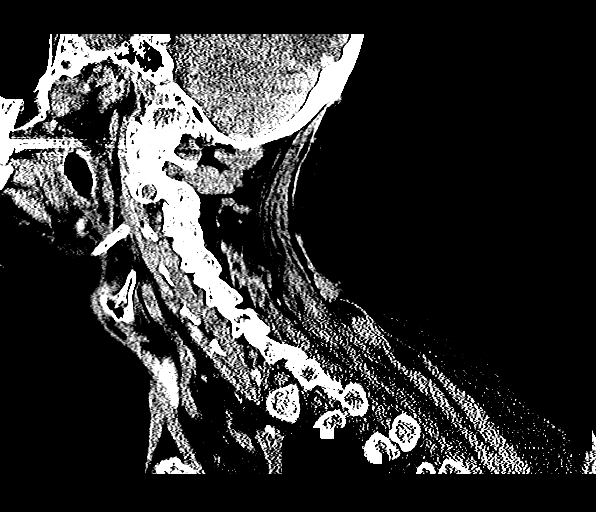

[15 of 47 positions shown; findings below may reference images not displayed]

FINDINGS: CT HEAD FINDINGS

Brain: No evidence of acute infarction, hemorrhage, hydrocephalus,
extra-axial collection or mass lesion/mass effect. Old left
temporo-occipital infarct. Mild age-related cerebral atrophy with
compensatory dilatation of the ventricles, unchanged.

Vascular: Intracranial atherosclerotic vascular calcifications. No
hyperdense vessel.

Skull: Normal. Negative for fracture or focal lesion.

Sinuses/Orbits: The bilateral paranasal sinuses and mastoid air
cells are clear. The orbits are unremarkable.

Other: None.

CT CERVICAL SPINE FINDINGS

Alignment: Normal.

Skull base and vertebrae: No acute fracture. Well corticated old
fracture versus congenital nonunion of the C1 left posterior arch.
No primary bone lesion or focal pathologic process.

Soft tissues and spinal canal: No prevertebral fluid or swelling. No
visible canal hematoma.

Disc levels: Prominent anterior endplate flowing osteophytes,
consistent with diffuse idiopathic skeletal hyperostosis. Disc
spaces are relatively preserved.

Upper chest: Negative.

Other: None.
IMPRESSION: 1. No acute intracranial abnormality. Old left temporo-occipital
infarct.
2.  No acute cervical spine fracture.  Changes consistent with DISH.
